# Patient Record
Sex: Female | Born: 1975
Health system: Southern US, Community
[De-identification: ages and names within clinical notes are randomized; demographics above are authoritative.]

## PROBLEM LIST (undated history)

## (undated) DIAGNOSIS — F419 Anxiety disorder, unspecified: Secondary | ICD-10-CM

## (undated) DIAGNOSIS — Z21 Asymptomatic human immunodeficiency virus [HIV] infection status: Secondary | ICD-10-CM

## (undated) DIAGNOSIS — F32A Depression, unspecified: Secondary | ICD-10-CM

## (undated) HISTORY — PX: TUBAL LIGATION: SHX77

## (undated) HISTORY — DX: Depression, unspecified: F32.A

## (undated) HISTORY — PX: WISDOM TOOTH EXTRACTION: SHX21

---

## 1997-07-03 ENCOUNTER — Other Ambulatory Visit: Admission: RE | Admit: 1997-07-03 | Discharge: 1997-07-03 | Payer: Self-pay | Admitting: Obstetrics

## 1997-07-04 ENCOUNTER — Other Ambulatory Visit: Admission: RE | Admit: 1997-07-04 | Discharge: 1997-07-04 | Payer: Self-pay | Admitting: Obstetrics

## 1997-07-06 ENCOUNTER — Ambulatory Visit (HOSPITAL_COMMUNITY): Admission: RE | Admit: 1997-07-06 | Discharge: 1997-07-06 | Payer: Self-pay | Admitting: Obstetrics

## 1997-08-21 ENCOUNTER — Inpatient Hospital Stay (HOSPITAL_COMMUNITY): Admission: AD | Admit: 1997-08-21 | Discharge: 1997-08-21 | Payer: Self-pay | Admitting: Obstetrics

## 1997-08-22 ENCOUNTER — Inpatient Hospital Stay (HOSPITAL_COMMUNITY): Admission: AD | Admit: 1997-08-22 | Discharge: 1997-08-22 | Payer: Self-pay | Admitting: Obstetrics

## 1997-09-15 ENCOUNTER — Emergency Department (HOSPITAL_COMMUNITY): Admission: EM | Admit: 1997-09-15 | Discharge: 1997-09-15 | Payer: Self-pay | Admitting: Emergency Medicine

## 1998-05-28 ENCOUNTER — Other Ambulatory Visit: Admission: RE | Admit: 1998-05-28 | Discharge: 1998-05-28 | Payer: Self-pay | Admitting: Obstetrics

## 1999-02-20 ENCOUNTER — Encounter: Payer: Self-pay | Admitting: Emergency Medicine

## 1999-02-20 ENCOUNTER — Emergency Department (HOSPITAL_COMMUNITY): Admission: EM | Admit: 1999-02-20 | Discharge: 1999-02-20 | Payer: Self-pay | Admitting: Emergency Medicine

## 2000-07-13 ENCOUNTER — Emergency Department (HOSPITAL_COMMUNITY): Admission: EM | Admit: 2000-07-13 | Discharge: 2000-07-13 | Payer: Self-pay | Admitting: Emergency Medicine

## 2000-08-19 ENCOUNTER — Other Ambulatory Visit: Admission: RE | Admit: 2000-08-19 | Discharge: 2000-08-19 | Payer: Self-pay | Admitting: *Deleted

## 2001-04-28 ENCOUNTER — Other Ambulatory Visit: Admission: RE | Admit: 2001-04-28 | Discharge: 2001-04-28 | Payer: Self-pay | Admitting: *Deleted

## 2001-09-17 ENCOUNTER — Other Ambulatory Visit: Admission: RE | Admit: 2001-09-17 | Discharge: 2001-09-17 | Payer: Self-pay | Admitting: *Deleted

## 2002-05-19 ENCOUNTER — Other Ambulatory Visit: Admission: RE | Admit: 2002-05-19 | Discharge: 2002-05-19 | Payer: Self-pay | Admitting: *Deleted

## 2002-05-23 ENCOUNTER — Encounter (INDEPENDENT_AMBULATORY_CARE_PROVIDER_SITE_OTHER): Payer: Self-pay | Admitting: Infectious Diseases

## 2002-05-23 ENCOUNTER — Encounter: Admission: RE | Admit: 2002-05-23 | Discharge: 2002-05-23 | Payer: Self-pay | Admitting: Infectious Diseases

## 2002-05-23 ENCOUNTER — Encounter (INDEPENDENT_AMBULATORY_CARE_PROVIDER_SITE_OTHER): Payer: Self-pay | Admitting: *Deleted

## 2002-05-23 LAB — CONVERTED CEMR LAB: CD4 Count: 10 microliters

## 2002-06-06 ENCOUNTER — Encounter: Admission: RE | Admit: 2002-06-06 | Discharge: 2002-06-06 | Payer: Self-pay | Admitting: Infectious Diseases

## 2002-06-09 ENCOUNTER — Encounter (INDEPENDENT_AMBULATORY_CARE_PROVIDER_SITE_OTHER): Payer: Self-pay | Admitting: Infectious Diseases

## 2002-06-09 ENCOUNTER — Encounter: Admission: RE | Admit: 2002-06-09 | Discharge: 2002-06-09 | Payer: Self-pay | Admitting: Infectious Diseases

## 2002-06-20 ENCOUNTER — Encounter: Admission: RE | Admit: 2002-06-20 | Discharge: 2002-06-20 | Payer: Self-pay | Admitting: Infectious Diseases

## 2002-08-10 ENCOUNTER — Encounter (INDEPENDENT_AMBULATORY_CARE_PROVIDER_SITE_OTHER): Payer: Self-pay | Admitting: Infectious Diseases

## 2002-08-10 ENCOUNTER — Ambulatory Visit (HOSPITAL_COMMUNITY): Admission: RE | Admit: 2002-08-10 | Discharge: 2002-08-10 | Payer: Self-pay | Admitting: Infectious Diseases

## 2002-08-10 ENCOUNTER — Encounter: Admission: RE | Admit: 2002-08-10 | Discharge: 2002-08-10 | Payer: Self-pay | Admitting: Infectious Diseases

## 2002-08-29 ENCOUNTER — Encounter: Admission: RE | Admit: 2002-08-29 | Discharge: 2002-08-29 | Payer: Self-pay | Admitting: Infectious Diseases

## 2002-11-02 ENCOUNTER — Encounter: Admission: RE | Admit: 2002-11-02 | Discharge: 2002-11-02 | Payer: Self-pay | Admitting: Infectious Diseases

## 2002-11-02 ENCOUNTER — Ambulatory Visit (HOSPITAL_COMMUNITY): Admission: RE | Admit: 2002-11-02 | Discharge: 2002-11-02 | Payer: Self-pay | Admitting: Infectious Diseases

## 2002-11-16 ENCOUNTER — Encounter: Admission: RE | Admit: 2002-11-16 | Discharge: 2002-11-16 | Payer: Self-pay | Admitting: Infectious Diseases

## 2002-12-07 ENCOUNTER — Encounter: Admission: RE | Admit: 2002-12-07 | Discharge: 2002-12-07 | Payer: Self-pay | Admitting: Infectious Diseases

## 2002-12-16 ENCOUNTER — Encounter: Admission: RE | Admit: 2002-12-16 | Discharge: 2002-12-16 | Payer: Self-pay | Admitting: Internal Medicine

## 2003-02-17 ENCOUNTER — Encounter: Admission: RE | Admit: 2003-02-17 | Discharge: 2003-02-17 | Payer: Self-pay | Admitting: Infectious Diseases

## 2003-03-06 ENCOUNTER — Encounter: Admission: RE | Admit: 2003-03-06 | Discharge: 2003-03-06 | Payer: Self-pay | Admitting: Infectious Diseases

## 2003-05-26 ENCOUNTER — Other Ambulatory Visit: Admission: RE | Admit: 2003-05-26 | Discharge: 2003-05-26 | Payer: Self-pay | Admitting: *Deleted

## 2003-06-26 ENCOUNTER — Encounter: Admission: RE | Admit: 2003-06-26 | Discharge: 2003-06-26 | Payer: Self-pay | Admitting: Infectious Diseases

## 2003-07-10 ENCOUNTER — Encounter: Admission: RE | Admit: 2003-07-10 | Discharge: 2003-07-10 | Payer: Self-pay | Admitting: Infectious Diseases

## 2003-10-30 ENCOUNTER — Ambulatory Visit: Payer: Self-pay | Admitting: Infectious Diseases

## 2003-10-30 ENCOUNTER — Ambulatory Visit (HOSPITAL_COMMUNITY): Admission: RE | Admit: 2003-10-30 | Discharge: 2003-10-30 | Payer: Self-pay | Admitting: Infectious Diseases

## 2003-11-13 ENCOUNTER — Ambulatory Visit: Payer: Self-pay | Admitting: Infectious Diseases

## 2003-11-20 ENCOUNTER — Other Ambulatory Visit: Admission: RE | Admit: 2003-11-20 | Discharge: 2003-11-20 | Payer: Self-pay | Admitting: Obstetrics & Gynecology

## 2004-01-24 ENCOUNTER — Ambulatory Visit: Payer: Self-pay | Admitting: Infectious Diseases

## 2004-03-19 ENCOUNTER — Ambulatory Visit: Payer: Self-pay | Admitting: Infectious Diseases

## 2004-03-19 ENCOUNTER — Ambulatory Visit (HOSPITAL_COMMUNITY): Admission: RE | Admit: 2004-03-19 | Discharge: 2004-03-19 | Payer: Self-pay | Admitting: Infectious Diseases

## 2004-04-10 ENCOUNTER — Ambulatory Visit: Payer: Self-pay | Admitting: Infectious Diseases

## 2004-05-06 ENCOUNTER — Ambulatory Visit: Payer: Self-pay | Admitting: Infectious Diseases

## 2004-07-18 ENCOUNTER — Ambulatory Visit: Payer: Self-pay | Admitting: Infectious Diseases

## 2004-07-18 ENCOUNTER — Ambulatory Visit (HOSPITAL_COMMUNITY): Admission: RE | Admit: 2004-07-18 | Discharge: 2004-07-18 | Payer: Self-pay | Admitting: Infectious Diseases

## 2004-08-08 ENCOUNTER — Ambulatory Visit: Payer: Self-pay | Admitting: Infectious Diseases

## 2004-12-03 ENCOUNTER — Ambulatory Visit: Payer: Self-pay | Admitting: Infectious Diseases

## 2004-12-03 ENCOUNTER — Ambulatory Visit (HOSPITAL_COMMUNITY): Admission: RE | Admit: 2004-12-03 | Discharge: 2004-12-03 | Payer: Self-pay | Admitting: Infectious Diseases

## 2004-12-23 ENCOUNTER — Ambulatory Visit: Payer: Self-pay | Admitting: Infectious Diseases

## 2005-04-09 ENCOUNTER — Encounter (INDEPENDENT_AMBULATORY_CARE_PROVIDER_SITE_OTHER): Payer: Self-pay | Admitting: *Deleted

## 2005-04-09 ENCOUNTER — Encounter: Admission: RE | Admit: 2005-04-09 | Discharge: 2005-04-09 | Payer: Self-pay | Admitting: Infectious Diseases

## 2005-04-09 ENCOUNTER — Ambulatory Visit: Payer: Self-pay | Admitting: Infectious Diseases

## 2005-04-09 LAB — CONVERTED CEMR LAB
CD4 Count: 580 microliters
HIV 1 RNA Quant: 49 copies/mL

## 2005-04-28 ENCOUNTER — Ambulatory Visit: Payer: Self-pay | Admitting: Infectious Diseases

## 2005-10-27 ENCOUNTER — Ambulatory Visit: Payer: Self-pay | Admitting: Infectious Diseases

## 2005-10-27 ENCOUNTER — Encounter: Admission: RE | Admit: 2005-10-27 | Discharge: 2005-10-27 | Payer: Self-pay | Admitting: Infectious Diseases

## 2005-10-27 ENCOUNTER — Encounter (INDEPENDENT_AMBULATORY_CARE_PROVIDER_SITE_OTHER): Payer: Self-pay | Admitting: *Deleted

## 2005-11-10 ENCOUNTER — Ambulatory Visit: Payer: Self-pay | Admitting: Infectious Diseases

## 2005-11-11 ENCOUNTER — Encounter: Payer: Self-pay | Admitting: Internal Medicine

## 2005-11-11 DIAGNOSIS — B20 Human immunodeficiency virus [HIV] disease: Secondary | ICD-10-CM

## 2005-11-11 DIAGNOSIS — F329 Major depressive disorder, single episode, unspecified: Secondary | ICD-10-CM

## 2005-11-11 DIAGNOSIS — R87613 High grade squamous intraepithelial lesion on cytologic smear of cervix (HGSIL): Secondary | ICD-10-CM | POA: Insufficient documentation

## 2006-03-02 ENCOUNTER — Encounter: Admission: RE | Admit: 2006-03-02 | Discharge: 2006-03-02 | Payer: Self-pay | Admitting: Infectious Diseases

## 2006-03-02 ENCOUNTER — Ambulatory Visit: Payer: Self-pay | Admitting: Infectious Diseases

## 2006-03-02 ENCOUNTER — Encounter (INDEPENDENT_AMBULATORY_CARE_PROVIDER_SITE_OTHER): Payer: Self-pay | Admitting: *Deleted

## 2006-03-02 LAB — CONVERTED CEMR LAB
Albumin: 4.5 g/dL (ref 3.5–5.2)
Alkaline Phosphatase: 71 units/L (ref 39–117)
Bilirubin Urine: NEGATIVE
CD4 Count: 590 microliters
CO2: 27 meq/L (ref 19–32)
Eosinophils Relative: 2 % (ref 0–5)
Glucose, Bld: 85 mg/dL (ref 70–99)
HIV 1 RNA Quant: 64 copies/mL
HIV 1 RNA Quant: 64 copies/mL — ABNORMAL HIGH (ref <50)
HIV-1 RNA Quant, Log: 1.81 — ABNORMAL HIGH (ref <1.70)
LDL Cholesterol: 76 mg/dL (ref 0–99)
Leukocytes, UA: NEGATIVE
Lymphocytes Relative: 45 % (ref 12–46)
Neutro Abs: 2.1 10*3/uL (ref 1.7–7.7)
Neutrophils Relative %: 43 % (ref 43–77)
Nitrite: NEGATIVE
Platelets: 247 10*3/uL (ref 150–400)
Potassium: 4.2 meq/L (ref 3.5–5.3)
RDW: 12.4 % (ref 11.5–14.0)
Sodium: 140 meq/L (ref 135–145)
Specific Gravity, Urine: 1.028 (ref 1.005–1.03)
Total Protein: 7.6 g/dL (ref 6.0–8.3)
Triglycerides: 77 mg/dL (ref <150)
Urobilinogen, UA: 0.2 (ref 0.0–1.0)

## 2006-03-30 ENCOUNTER — Encounter (INDEPENDENT_AMBULATORY_CARE_PROVIDER_SITE_OTHER): Payer: Self-pay | Admitting: Infectious Diseases

## 2006-04-06 ENCOUNTER — Encounter (INDEPENDENT_AMBULATORY_CARE_PROVIDER_SITE_OTHER): Payer: Self-pay | Admitting: *Deleted

## 2006-04-06 LAB — CONVERTED CEMR LAB

## 2006-04-14 ENCOUNTER — Ambulatory Visit: Payer: Self-pay | Admitting: Infectious Diseases

## 2006-04-19 ENCOUNTER — Encounter (INDEPENDENT_AMBULATORY_CARE_PROVIDER_SITE_OTHER): Payer: Self-pay | Admitting: *Deleted

## 2006-05-20 ENCOUNTER — Ambulatory Visit (HOSPITAL_COMMUNITY): Admission: RE | Admit: 2006-05-20 | Discharge: 2006-05-20 | Payer: Self-pay | Admitting: Obstetrics and Gynecology

## 2006-07-14 ENCOUNTER — Telehealth (INDEPENDENT_AMBULATORY_CARE_PROVIDER_SITE_OTHER): Payer: Self-pay | Admitting: Infectious Diseases

## 2006-07-28 ENCOUNTER — Encounter (INDEPENDENT_AMBULATORY_CARE_PROVIDER_SITE_OTHER): Payer: Self-pay | Admitting: *Deleted

## 2006-08-03 ENCOUNTER — Encounter: Admission: RE | Admit: 2006-08-03 | Discharge: 2006-08-03 | Payer: Self-pay | Admitting: Infectious Diseases

## 2006-08-03 ENCOUNTER — Ambulatory Visit: Payer: Self-pay | Admitting: Infectious Diseases

## 2006-08-03 ENCOUNTER — Encounter (INDEPENDENT_AMBULATORY_CARE_PROVIDER_SITE_OTHER): Payer: Self-pay | Admitting: *Deleted

## 2006-08-03 LAB — CONVERTED CEMR LAB
ALT: 27 units/L (ref 0–35)
AST: 22 units/L (ref 0–37)
BUN: 10 mg/dL (ref 6–23)
Basophils Absolute: 0 10*3/uL (ref 0.0–0.1)
Basophils Relative: 1 % (ref 0–1)
Creatinine, Ser: 0.8 mg/dL (ref 0.40–1.20)
Eosinophils Relative: 5 % (ref 0–5)
HCT: 40.3 % (ref 36.0–46.0)
HIV 1 RNA Quant: <50 copies/mL (ref <50)
Hemoglobin: 12.6 g/dL (ref 12.0–15.0)
MCHC: 31.3 g/dL (ref 30.0–36.0)
MCV: 90.8 fL (ref 78.0–100.0)
Monocytes Absolute: 0.2 10*3/uL (ref 0.2–0.7)
RDW: 12.5 % (ref 11.5–14.0)
Total Bilirubin: 0.4 mg/dL (ref 0.3–1.2)

## 2006-08-04 ENCOUNTER — Encounter: Payer: Self-pay | Admitting: Internal Medicine

## 2006-08-04 LAB — CONVERTED CEMR LAB: CD4 Count: 400 microliters

## 2006-08-31 ENCOUNTER — Encounter (INDEPENDENT_AMBULATORY_CARE_PROVIDER_SITE_OTHER): Payer: Self-pay | Admitting: *Deleted

## 2006-09-02 ENCOUNTER — Ambulatory Visit: Payer: Self-pay | Admitting: Internal Medicine

## 2006-09-02 DIAGNOSIS — N920 Excessive and frequent menstruation with regular cycle: Secondary | ICD-10-CM

## 2006-10-21 ENCOUNTER — Telehealth: Payer: Self-pay | Admitting: Internal Medicine

## 2006-11-12 ENCOUNTER — Encounter: Payer: Self-pay | Admitting: Internal Medicine

## 2006-11-12 LAB — CONVERTED CEMR LAB: Pap Smear: ABNORMAL

## 2006-11-16 ENCOUNTER — Ambulatory Visit: Payer: Self-pay | Admitting: Internal Medicine

## 2006-11-16 ENCOUNTER — Encounter: Admission: RE | Admit: 2006-11-16 | Discharge: 2006-11-16 | Payer: Self-pay | Admitting: Internal Medicine

## 2006-11-16 LAB — CONVERTED CEMR LAB
Albumin: 3.7 g/dL (ref 3.5–5.2)
Alkaline Phosphatase: 42 units/L (ref 39–117)
BUN: 8 mg/dL (ref 6–23)
CO2: 29 meq/L (ref 19–32)
Calcium: 9.2 mg/dL (ref 8.4–10.5)
Chloride: 104 meq/L (ref 96–112)
Eosinophils Absolute: 0.1 10*3/uL (ref 0.0–0.7)
Glucose, Bld: 83 mg/dL (ref 70–99)
HIV 1 RNA Quant: <50 copies/mL (ref <50)
HIV-1 RNA Quant, Log: <1.7 (ref <1.70)
Lymphocytes Relative: 27 % (ref 12–46)
Lymphs Abs: 1.7 10*3/uL (ref 0.7–3.3)
MCV: 87.8 fL (ref 78.0–100.0)
Monocytes Relative: 9 % (ref 3–11)
Neutrophils Relative %: 62 % (ref 43–77)
Potassium: 3.9 meq/L (ref 3.5–5.3)
RBC: 4.06 M/uL (ref 3.87–5.11)
Sodium: 137 meq/L (ref 135–145)
Total Protein: 6.6 g/dL (ref 6.0–8.3)
WBC: 6.4 10*3/uL (ref 4.0–10.5)

## 2006-11-17 ENCOUNTER — Ambulatory Visit: Payer: Self-pay | Admitting: Internal Medicine

## 2006-11-25 ENCOUNTER — Inpatient Hospital Stay (HOSPITAL_COMMUNITY): Admission: AD | Admit: 2006-11-25 | Discharge: 2006-11-25 | Payer: Self-pay | Admitting: Obstetrics

## 2006-11-28 ENCOUNTER — Inpatient Hospital Stay (HOSPITAL_COMMUNITY): Admission: AD | Admit: 2006-11-28 | Discharge: 2006-11-28 | Payer: Self-pay | Admitting: Obstetrics & Gynecology

## 2006-12-01 ENCOUNTER — Inpatient Hospital Stay (HOSPITAL_COMMUNITY): Admission: AD | Admit: 2006-12-01 | Discharge: 2006-12-01 | Payer: Self-pay | Admitting: Obstetrics & Gynecology

## 2006-12-04 ENCOUNTER — Inpatient Hospital Stay (HOSPITAL_COMMUNITY): Admission: AD | Admit: 2006-12-04 | Discharge: 2006-12-04 | Payer: Self-pay | Admitting: Obstetrics & Gynecology

## 2007-02-08 ENCOUNTER — Encounter (INDEPENDENT_AMBULATORY_CARE_PROVIDER_SITE_OTHER): Payer: Self-pay | Admitting: *Deleted

## 2007-02-09 ENCOUNTER — Encounter (INDEPENDENT_AMBULATORY_CARE_PROVIDER_SITE_OTHER): Payer: Self-pay | Admitting: *Deleted

## 2007-03-04 ENCOUNTER — Encounter: Payer: Self-pay | Admitting: Internal Medicine

## 2007-03-19 ENCOUNTER — Encounter: Admission: RE | Admit: 2007-03-19 | Discharge: 2007-03-19 | Payer: Self-pay | Admitting: Internal Medicine

## 2007-03-19 ENCOUNTER — Ambulatory Visit: Payer: Self-pay | Admitting: Internal Medicine

## 2007-03-19 LAB — CONVERTED CEMR LAB
ALT: 18 units/L (ref 0–35)
AST: 20 units/L (ref 0–37)
Basophils Absolute: 0 10*3/uL (ref 0.0–0.1)
Basophils Relative: 0 % (ref 0–1)
CO2: 22 meq/L (ref 19–32)
Calcium: 9.3 mg/dL (ref 8.4–10.5)
Chloride: 106 meq/L (ref 96–112)
Creatinine, Ser: 0.78 mg/dL (ref 0.40–1.20)
HIV 1 RNA Quant: 62 copies/mL — ABNORMAL HIGH (ref <50)
Lymphocytes Relative: 30 % (ref 12–46)
MCHC: 32.9 g/dL (ref 30.0–36.0)
Monocytes Absolute: 0.4 10*3/uL (ref 0.1–1.0)
Neutro Abs: 2.8 10*3/uL (ref 1.7–7.7)
Neutrophils Relative %: 61 % (ref 43–77)
Platelets: 221 10*3/uL (ref 150–400)
RDW: 12.9 % (ref 11.5–15.5)
Sodium: 139 meq/L (ref 135–145)
Total Protein: 7.6 g/dL (ref 6.0–8.3)

## 2007-04-02 ENCOUNTER — Ambulatory Visit: Payer: Self-pay | Admitting: Internal Medicine

## 2007-04-02 DIAGNOSIS — O009 Unspecified ectopic pregnancy without intrauterine pregnancy: Secondary | ICD-10-CM

## 2007-06-21 ENCOUNTER — Telehealth: Payer: Self-pay | Admitting: Internal Medicine

## 2007-06-23 ENCOUNTER — Ambulatory Visit: Payer: Self-pay | Admitting: Internal Medicine

## 2007-07-22 ENCOUNTER — Telehealth: Payer: Self-pay | Admitting: Internal Medicine

## 2007-07-28 ENCOUNTER — Encounter: Admission: RE | Admit: 2007-07-28 | Discharge: 2007-07-28 | Payer: Self-pay | Admitting: Internal Medicine

## 2007-07-28 ENCOUNTER — Ambulatory Visit: Payer: Self-pay | Admitting: Internal Medicine

## 2007-07-28 LAB — CONVERTED CEMR LAB
ALT: 19 units/L (ref 0–35)
AST: 17 units/L (ref 0–37)
Alkaline Phosphatase: 37 units/L — ABNORMAL LOW (ref 39–117)
BUN: 12 mg/dL (ref 6–23)
Basophils Absolute: 0 10*3/uL (ref 0.0–0.1)
Basophils Relative: 1 % (ref 0–1)
Creatinine, Ser: 0.75 mg/dL (ref 0.40–1.20)
Eosinophils Absolute: 0.2 10*3/uL (ref 0.0–0.7)
Hemoglobin: 11.6 g/dL — ABNORMAL LOW (ref 12.0–15.0)
MCHC: 32 g/dL (ref 30.0–36.0)
MCV: 89.9 fL (ref 78.0–100.0)
Monocytes Absolute: 0.3 10*3/uL (ref 0.1–1.0)
Monocytes Relative: 8 % (ref 3–12)
Neutrophils Relative %: 50 % (ref 43–77)
RBC: 4.04 M/uL (ref 3.87–5.11)
RDW: 14.2 % (ref 11.5–15.5)
Total Bilirubin: 0.3 mg/dL (ref 0.3–1.2)

## 2007-08-09 ENCOUNTER — Telehealth: Payer: Self-pay | Admitting: Internal Medicine

## 2007-08-11 ENCOUNTER — Ambulatory Visit: Payer: Self-pay | Admitting: Internal Medicine

## 2007-08-17 ENCOUNTER — Ambulatory Visit (HOSPITAL_COMMUNITY): Admission: RE | Admit: 2007-08-17 | Discharge: 2007-08-17 | Payer: Self-pay | Admitting: Obstetrics and Gynecology

## 2007-10-13 ENCOUNTER — Telehealth: Payer: Self-pay | Admitting: Internal Medicine

## 2007-11-09 ENCOUNTER — Ambulatory Visit: Payer: Self-pay | Admitting: Internal Medicine

## 2007-11-09 LAB — CONVERTED CEMR LAB
Albumin: 4.1 g/dL (ref 3.5–5.2)
Alkaline Phosphatase: 30 units/L — ABNORMAL LOW (ref 39–117)
BUN: 9 mg/dL (ref 6–23)
Creatinine, Ser: 0.63 mg/dL (ref 0.40–1.20)
Eosinophils Absolute: 0.2 10*3/uL (ref 0.0–0.7)
Eosinophils Relative: 2 % (ref 0–5)
Glucose, Bld: 71 mg/dL (ref 70–99)
HCT: 34.9 % — ABNORMAL LOW (ref 36.0–46.0)
HIV 1 RNA Quant: <50 copies/mL (ref <50)
HIV-1 RNA Quant, Log: <1.7 (ref <1.70)
Hemoglobin: 11.2 g/dL — ABNORMAL LOW (ref 12.0–15.0)
Lymphs Abs: 1.1 10*3/uL (ref 0.7–4.0)
MCV: 95.9 fL (ref 78.0–100.0)
Monocytes Absolute: 0.7 10*3/uL (ref 0.1–1.0)
Monocytes Relative: 9 % (ref 3–12)
Neutrophils Relative %: 73 % (ref 43–77)
Potassium: 3.8 meq/L (ref 3.5–5.3)
RBC: 3.64 M/uL — ABNORMAL LOW (ref 3.87–5.11)
WBC: 7.4 10*3/uL (ref 4.0–10.5)

## 2007-11-24 ENCOUNTER — Ambulatory Visit: Payer: Self-pay | Admitting: Internal Medicine

## 2007-12-20 ENCOUNTER — Inpatient Hospital Stay (HOSPITAL_COMMUNITY): Admission: AD | Admit: 2007-12-20 | Discharge: 2007-12-20 | Payer: Self-pay | Admitting: Obstetrics

## 2008-02-22 ENCOUNTER — Ambulatory Visit: Payer: Self-pay | Admitting: Internal Medicine

## 2008-02-22 LAB — CONVERTED CEMR LAB
ALT: 12 units/L (ref 0–35)
Albumin: 3.5 g/dL (ref 3.5–5.2)
Basophils Absolute: 0 10*3/uL (ref 0.0–0.1)
CO2: 21 meq/L (ref 19–32)
Calcium: 8.9 mg/dL (ref 8.4–10.5)
Chloride: 103 meq/L (ref 96–112)
HIV 1 RNA Quant: <48 copies/mL (ref <48)
Hemoglobin: 10.6 g/dL — ABNORMAL LOW (ref 12.0–15.0)
Lymphocytes Relative: 17 % (ref 12–46)
Monocytes Absolute: 0.6 10*3/uL (ref 0.1–1.0)
Neutro Abs: 5 10*3/uL (ref 1.7–7.7)
Platelets: 162 10*3/uL (ref 150–400)
RDW: 12.7 % (ref 11.5–15.5)
Sodium: 138 meq/L (ref 135–145)
Total Protein: 6.4 g/dL (ref 6.0–8.3)

## 2008-02-26 ENCOUNTER — Inpatient Hospital Stay (HOSPITAL_COMMUNITY): Admission: AD | Admit: 2008-02-26 | Discharge: 2008-02-26 | Payer: Self-pay | Admitting: Obstetrics and Gynecology

## 2008-02-27 ENCOUNTER — Inpatient Hospital Stay (HOSPITAL_COMMUNITY): Admission: AD | Admit: 2008-02-27 | Discharge: 2008-02-27 | Payer: Self-pay | Admitting: Obstetrics and Gynecology

## 2008-03-08 ENCOUNTER — Ambulatory Visit: Payer: Self-pay | Admitting: Internal Medicine

## 2008-03-14 ENCOUNTER — Encounter (INDEPENDENT_AMBULATORY_CARE_PROVIDER_SITE_OTHER): Payer: Self-pay | Admitting: *Deleted

## 2008-04-03 ENCOUNTER — Ambulatory Visit: Payer: Self-pay | Admitting: Internal Medicine

## 2008-04-09 ENCOUNTER — Inpatient Hospital Stay (HOSPITAL_COMMUNITY): Admission: AD | Admit: 2008-04-09 | Discharge: 2008-04-10 | Payer: Self-pay | Admitting: Obstetrics and Gynecology

## 2008-04-28 ENCOUNTER — Inpatient Hospital Stay (HOSPITAL_COMMUNITY): Admission: AD | Admit: 2008-04-28 | Discharge: 2008-05-01 | Payer: Self-pay | Admitting: Obstetrics

## 2008-04-28 ENCOUNTER — Encounter: Admission: RE | Admit: 2008-04-28 | Discharge: 2008-04-28 | Payer: Self-pay | Admitting: Obstetrics and Gynecology

## 2008-04-29 ENCOUNTER — Ambulatory Visit: Payer: Self-pay | Admitting: Infectious Diseases

## 2008-05-01 ENCOUNTER — Telehealth (INDEPENDENT_AMBULATORY_CARE_PROVIDER_SITE_OTHER): Payer: Self-pay | Admitting: Licensed Clinical Social Worker

## 2008-05-03 ENCOUNTER — Ambulatory Visit: Payer: Self-pay | Admitting: Internal Medicine

## 2008-07-25 ENCOUNTER — Ambulatory Visit: Payer: Self-pay | Admitting: Internal Medicine

## 2008-07-25 LAB — CONVERTED CEMR LAB
Basophils Absolute: 0 10*3/uL (ref 0.0–0.1)
CO2: 25 meq/L (ref 19–32)
Creatinine, Ser: 0.71 mg/dL (ref 0.40–1.20)
GFR calc Af Amer: >60 mL/min (ref >60)
GFR calc non Af Amer: >60 mL/min (ref >60)
Glucose, Bld: 86 mg/dL (ref 70–99)
HIV 1 RNA Quant: <48 copies/mL (ref <48)
HIV-1 RNA Quant, Log: <1.68 (ref <1.68)
Hemoglobin: 12.1 g/dL (ref 12.0–15.0)
Lymphocytes Relative: 45 % (ref 12–46)
Monocytes Absolute: 0.6 10*3/uL (ref 0.1–1.0)
Neutro Abs: 2.5 10*3/uL (ref 1.7–7.7)
RBC: 4.15 M/uL (ref 3.87–5.11)
RDW: 11.9 % (ref 11.5–15.5)
Total Bilirubin: 0.2 mg/dL — ABNORMAL LOW (ref 0.3–1.2)
Total Protein: 7.2 g/dL (ref 6.0–8.3)

## 2008-07-28 ENCOUNTER — Ambulatory Visit (HOSPITAL_COMMUNITY): Admission: RE | Admit: 2008-07-28 | Discharge: 2008-07-28 | Payer: Self-pay | Admitting: Obstetrics and Gynecology

## 2008-08-23 ENCOUNTER — Ambulatory Visit: Payer: Self-pay | Admitting: Internal Medicine

## 2008-11-29 ENCOUNTER — Encounter: Payer: Self-pay | Admitting: Internal Medicine

## 2008-11-30 ENCOUNTER — Telehealth: Payer: Self-pay | Admitting: Internal Medicine

## 2009-02-14 ENCOUNTER — Telehealth: Payer: Self-pay | Admitting: Internal Medicine

## 2009-02-15 ENCOUNTER — Encounter: Payer: Self-pay | Admitting: Internal Medicine

## 2009-02-16 ENCOUNTER — Ambulatory Visit: Payer: Self-pay | Admitting: Internal Medicine

## 2009-02-16 LAB — CONVERTED CEMR LAB
ALT: 16 units/L (ref 0–35)
AST: 14 units/L (ref 0–37)
Alkaline Phosphatase: 50 units/L (ref 39–117)
Basophils Absolute: 0 10*3/uL (ref 0.0–0.1)
Basophils Relative: 1 % (ref 0–1)
Calcium: 9.2 mg/dL (ref 8.4–10.5)
Chloride: 103 meq/L (ref 96–112)
Creatinine, Ser: 0.74 mg/dL (ref 0.40–1.20)
Eosinophils Absolute: 0.1 10*3/uL (ref 0.0–0.7)
Eosinophils Relative: 2 % (ref 0–5)
HCT: 39.9 % (ref 36.0–46.0)
HIV 1 RNA Quant: 49 copies/mL — ABNORMAL HIGH (ref <48)
LDL Cholesterol: 90 mg/dL (ref 0–99)
MCHC: 31.8 g/dL (ref 30.0–36.0)
MCV: 86.6 fL (ref >=78.0)
Monocytes Absolute: 0.3 10*3/uL (ref 0.1–1.0)
Platelets: 257 10*3/uL (ref 150–400)
RDW: 12.3 % (ref 11.5–15.5)
Total Bilirubin: 0.2 mg/dL — ABNORMAL LOW (ref 0.3–1.2)
VLDL: 9 mg/dL (ref 0–40)

## 2009-02-28 ENCOUNTER — Ambulatory Visit: Payer: Self-pay | Admitting: Internal Medicine

## 2009-03-19 ENCOUNTER — Encounter (INDEPENDENT_AMBULATORY_CARE_PROVIDER_SITE_OTHER): Payer: Self-pay | Admitting: *Deleted

## 2009-03-30 ENCOUNTER — Encounter (INDEPENDENT_AMBULATORY_CARE_PROVIDER_SITE_OTHER): Payer: Self-pay | Admitting: *Deleted

## 2009-06-19 ENCOUNTER — Encounter: Payer: Self-pay | Admitting: Internal Medicine

## 2009-11-21 ENCOUNTER — Ambulatory Visit: Payer: Self-pay | Admitting: Internal Medicine

## 2009-11-21 LAB — CONVERTED CEMR LAB
AST: 14 units/L (ref 0–37)
Albumin: 4.2 g/dL (ref 3.5–5.2)
Alkaline Phosphatase: 44 units/L (ref 39–117)
BUN: 9 mg/dL (ref 6–23)
Basophils Relative: 1 % (ref 0–1)
Calcium: 9 mg/dL (ref 8.4–10.5)
Chloride: 106 meq/L (ref 96–112)
Eosinophils Absolute: 0.2 10*3/uL (ref 0.0–0.7)
HIV-1 RNA Quant, Log: <1.3 (ref <1.30)
Lymphs Abs: 1.9 10*3/uL (ref 0.7–4.0)
MCHC: 32.7 g/dL (ref 30.0–36.0)
Monocytes Relative: 8 % (ref 3–12)
Neutro Abs: 4 10*3/uL (ref 1.7–7.7)
Neutrophils Relative %: 60 % (ref 43–77)
Platelets: 213 10*3/uL (ref 150–400)
Potassium: 4.5 meq/L (ref 3.5–5.3)
RBC: 4.24 M/uL (ref 3.87–5.11)
Sodium: 140 meq/L (ref 135–145)
Total Protein: 7 g/dL (ref 6.0–8.3)
WBC: 6.6 10*3/uL (ref 4.0–10.5)

## 2009-12-07 ENCOUNTER — Ambulatory Visit: Payer: Self-pay | Admitting: Internal Medicine

## 2009-12-07 DIAGNOSIS — R634 Abnormal weight loss: Secondary | ICD-10-CM | POA: Insufficient documentation

## 2009-12-10 LAB — CONVERTED CEMR LAB: Vit D, 25-Hydroxy: 28 ng/mL — ABNORMAL LOW (ref 30–89)

## 2009-12-13 ENCOUNTER — Encounter (INDEPENDENT_AMBULATORY_CARE_PROVIDER_SITE_OTHER): Payer: Self-pay | Admitting: *Deleted

## 2009-12-14 ENCOUNTER — Ambulatory Visit: Payer: Self-pay | Admitting: Internal Medicine

## 2009-12-14 LAB — CONVERTED CEMR LAB: Pap Smear: NEGATIVE

## 2009-12-21 ENCOUNTER — Encounter: Payer: Self-pay | Admitting: Internal Medicine

## 2009-12-25 ENCOUNTER — Encounter (INDEPENDENT_AMBULATORY_CARE_PROVIDER_SITE_OTHER): Payer: Self-pay | Admitting: *Deleted

## 2010-01-08 ENCOUNTER — Ambulatory Visit: Payer: Self-pay | Admitting: Internal Medicine

## 2010-03-14 NOTE — Miscellaneous (Signed)
Summary: Problem list update  Clinical Lists Changes  Problems: Added new problem of SCREENING FOR MALIGNANT NEOPLASM OF THE CERVIX (ICD-V76.2) 

## 2010-03-14 NOTE — Miscellaneous (Signed)
Summary: Infant delivered 04/29/2008, neg HIV screen  Clinical Lists Changes

## 2010-03-14 NOTE — Progress Notes (Signed)
Summary: Noncompliance  Phone Note From Pharmacy   Caller: [facility] Details for Reason: Non compliance Summary of Call: Received a fax from patient's pharmacy for noncompliance of refilling her mediction.  Patient was supposed to refill medication by 02/01/2009 in which according to their records she did not. This fax was sent for informational purposes to the prescribing physician.     Appended Document: Noncompliance Can we refer her to a bridge counselor or somehow make contact with her.  I have not seen her since July.  Appended Document: Noncompliance    Phone Note Call from Patient   Caller: Patient Summary of Call: Patient returned my call and she was sorry about not coming in sooner, but she had been busy with the baby and lost track of time. She is to come in tomorrow for labs, and 2 weeks to see Dr. Philipp Deputy. Initial call taken by: Starleen Arms CMA,  February 15, 2009 4:18 PM

## 2010-03-14 NOTE — Miscellaneous (Signed)
Summary: RW Update  Clinical Lists Changes  Observations: Added new observation of DELIV TYPE: Vaginal (03/19/2009 11:26) Added new observation of #CHILDDELHIV: 0  (03/19/2009 11:26) Added new observation of #CHILDDEL: 1  (03/19/2009 11:26)

## 2010-03-14 NOTE — Assessment & Plan Note (Signed)
Summary: F/U OV/VS   CC:  follow-up visit, lab results, c/o fatigue, and 11 lb. weight loss.  History of Present Illness: Pt here for f/u. She has lost weight which concerns her. She states that she is so busy with her 79month old that she forgets to eat.  No chronic cough,hemoptysis,change in stool, fever,abdominal pain or diarrhea. Has been getting 6-7 hours of sleep but always feels fatigued.  Preventive Screening-Counseling & Management  Alcohol-Tobacco     Alcohol drinks/day: occasional     Alcohol type: wine, mixed drinks     Smoking Status: current     Smoking Cessation Counseling: yes     Packs/Day: <0.25     Year Quit: 2 months     Passive Smoke Exposure: no  Caffeine-Diet-Exercise     Caffeine use/day: coffee 2 per day     Does Patient Exercise: no  Hep-HIV-STD-Contraception     HIV Risk: risk noted  Safety-Violence-Falls     Seat Belt Use: yes      Sexual History:  currently monogamous.        Drug Use:  never.    Comments: pt. declined condoms   Updated Prior Medication List: ATRIPLA 600-200-300 MG TABS (EFAVIRENZ-EMTRICITAB-TENOFOVIR) Take 1 tablet by mouth at bedtime  Current Allergies (reviewed today): ! SULFA Past History:  Past Medical History: Last updated: 11/11/2005 Depression HIV disease  Vital Signs:  Patient profile:   35 year old female Menstrual status:  regular Height:      66 inches (167.64 cm) Weight:      101.8 pounds (46.27 kg) BMI:     16.49 Temp:     98.6 degrees F (37.00 degrees C) oral Pulse rate:   92 / minute BP sitting:   127 / 83  (left arm)  Vitals Entered By: Wendall Mola CMA Duncan Dull) (December 07, 2009 2:32 PM) CC: follow-up visit, lab results, c/o fatigue, 11 lb. weight loss Is Patient Diabetic? No Pain Assessment Patient in pain? no      Nutritional Status BMI of < 19 = underweight Nutritional Status Detail appetite "up and down"  Have you ever been in a relationship where you felt threatened, hurt  or afraid?No   Does patient need assistance? Functional Status Self care Ambulation Normal Comments no missed doses of meds per pt.    Impression & Recommendations:  Problem # 1:  HIV DISEASE (ICD-042)  Diagnostics Reviewed:  CD4: 640 (11/22/2009)   WBC: 6.6 (11/21/2009)   Hgb: 11.7 (11/21/2009)   HCT: 35.8 (11/21/2009)   Platelets: 213 (11/21/2009) HIV-1 RNA: <20 copies/mL (11/21/2009)   HBSAg: NO (04/06/2006)  Medications Added to Medication List This Visit: 1)  Ensure Liqd (Nutritional supplements) .... 2 cans per day  Other Orders: Est. Patient Level III (16109) T-TSH (60454-09811) T-T4, Free [phone]) T- * Misc. Laboratory test [phone]) Influenza Vaccine NON MCR [phone]) Vit B12 1000 mcg (N6295) Future Orders: T-CD4SP ([facility]) (CD4SP) ... 06/05/2010 T-HIV Viral Load [phone]) ... 06/05/2010 T-Comprehensive Metabolic Panel [phone]) ... 06/05/2010 T-CBC w/Diff ([id number]) ... 06/05/2010  Patient Instructions: 1)  Please schedule a follow-up appointment in 6 months, 2 weeks after labs. 2)  Schedule for PAP  Prescriptions: ENSURE  LIQD (NUTRITIONAL SUPPLEMENTS) 2 cans per day  #2 cases x prn   Entered by:   Wendall Mola CMA ( AAMA)   Authorized by:   Yisroel Ramming MD   Signed by:   Wendall Mola CMA ( AAMA) on 12/07/2009   Method used:   Print  then Give to Patient   RxID:   [id number]       Immunizations Administered:  Influenza Vaccine # 1:    Vaccine Type: Fluvax Non-MCR    Site: right deltoid    Mfr: [employer]    Dose: 0.5 ml    Route: IM    Given by: Wendall Mola CMA ( AAMA)    Exp. Date: 05/12/2010    Lot #: [id number]    VIS given: 09/04/09 version given December 07, 2009.  Flu Vaccine Consent Questions:    Do you have a history of severe allergic reactions to this vaccine? no    Any prior history of allergic reactions to egg and/or gelatin? no    Do you have a sensitivity to the preservative Thimersol? no     Do you have a past history of Guillan-Barre Syndrome? no    Do you currently have an acute febrile illness? no    Have you ever had a severe reaction to latex? no    Vaccine information given and explained to patient? yes    Are you currently pregnant? no    Medication Administration  Injection # 1:    Medication: Vit B12 1000 mcg    Diagnosis: LOSS OF WEIGHT (ICD-783.21)    Route: IM    Site: L deltoid    Exp Date: 06/11/2011    Lot #: [id number]    Mfr: [employer]    Patient tolerated injection without complications    Given by: Wendall Mola CMA Duncan Dull) (December 07, 2009 4:29 PM)  Orders Added: 1)  T-CD4SP [facility]) [CD4SP] 2)  T-HIV Viral Load [phone] 3)  T-Comprehensive Metabolic Panel [80053-22900] 4)  T-CBC w/Diff [[id number]] 5)  Est. Patient Level III [40102] 6)  T-TSH [72536-64403] 7)  T-T4, Free [47425-95638] 8)  T- * Misc. Laboratory test [99999] 9)  Influenza Vaccine NON MCR [00028] 10)  Vit B12 1000 mcg [J3420]

## 2010-03-14 NOTE — Assessment & Plan Note (Signed)
Summary: b12 shot  Prior Medications: ATRIPLA 600-200-300 MG TABS (EFAVIRENZ-EMTRICITAB-TENOFOVIR) Take 1 tablet by mouth at bedtime ENSURE  LIQD (NUTRITIONAL SUPPLEMENTS) 2 cans per day Current Allergies: ! SULFA Medication Administration  Injection # 1:    Medication: Vit B12 1000 mcg    Diagnosis: HIV DISEASE (ICD-042)    Route: IM    Site: L deltoid    Exp Date: 09/11/2011    Lot #: [id number]    Mfr: [employer]    Patient tolerated injection without complications    Given by: Starleen Arms CMA (January 08, 2010 10:00 AM)  Orders Added: 1)  Vit B12 1000 mcg [J3420]    Medication Administration  Injection # 1:    Medication: Vit B12 1000 mcg    Diagnosis: HIV DISEASE (ICD-042)    Route: IM    Site: L deltoid    Exp Date: 09/11/2011    Lot #: [id number]    Mfr: [employer]    Patient tolerated injection without complications    Given by: Starleen Arms CMA (January 08, 2010 10:00 AM)  Orders Added: 1)  Vit B12 1000 mcg [J3420]

## 2010-03-14 NOTE — Medication Information (Signed)
Summary: [facility]   Imported By: Florinda Marker 07/17/2009 16:49:25  _____________________________________________________________________  External Attachment:    Type:   Image     Comment:   External Document

## 2010-03-14 NOTE — Letter (Signed)
Summary: Results Follow-up Letter  [facility]  [address]   [place], [place] [address]   Phone: [phone]  Fax: [phone]         December 21, 2009  [address] [place], [place]  [address]  Dear Ms. Paget,   The following are the results of your recent test(s):  Test     Result     Pap Smear    Normal_XXX___  Not Normal_____       Comments:  Everything was normal.  I will see you in one year for your next PAP smear.  Thank you for coming to the Center for your care.  Happy Holidays!  Sincerely,    [name] [facility]

## 2010-03-14 NOTE — Miscellaneous (Signed)
  Clinical Lists Changes  Observations: Added new observation of YEARAIDSPOS: 2004  (12/25/2009 14:46) Added new observation of HIV STATUS: CDC-defined AIDS  (12/25/2009 14:46)

## 2010-03-14 NOTE — Medication Information (Signed)
Summary: [facility]   Imported By: Florinda Marker 02/15/2009 14:52:31  _____________________________________________________________________  External Attachment:    Type:   Image     Comment:   External Document

## 2010-03-14 NOTE — Assessment & Plan Note (Signed)
Summary: PAP SMEAR appt   Vital Signs:  Patient profile:   35 year old female Menstrual status:  regular LMP:     12/02/2009  Vitals Entered By: [name] RN (December 14, 2009 2:29 PM) CC: PAP smear visit.  Previously had abnl PAP, had laproscopy and LEEP in the past.  Daughter born 19 months ago.  Fast Delivery with cervical tearing.  Pt. given educational materials re:  HIV and women, diet, exercise, nutrition, BSE and self-esteem.  Pt given condoms., Depression LMP (date): 12/02/2009 LMP - Character: normal     Menstrual Status regular Enter LMP: 12/02/2009 Last PAP Result abnormal   Preventive Screening-Counseling & Management      Drug Use:  never.    Behavioral Health Assessment Do you use recreational drugs? never  Evaluation and Follow-Up  Prevention For Positives: 12/14/2009   Safe sex practices discussed with patient. Condoms offered. Prior Medications: ATRIPLA 600-200-300 MG TABS (EFAVIRENZ-EMTRICITAB-TENOFOVIR) Take 1 tablet by mouth at bedtime ENSURE  LIQD (NUTRITIONAL SUPPLEMENTS) 2 cans per day Current Allergies: ! SULFA Orders Added: 1)  Est. Patient Level I [95638] 2)  T-PAP [facility]) [75643]  Patient Instructions: 1)  Please schedule a follow-up appointment in 1 year. 2)  Your results will be ready in about a week.  I will mail them to you. 3)  Thank you for coming to the Center for your Care.           Medication Adherence: 12/14/2009   Adherence to medications reviewed with patient. Counseling to provide adequate adherence provided    Prevention For Positives: 12/14/2009   Safe sex practices discussed with patient. Condoms offered.        12/14/2009   Patient was screened for substance abuse and depression. Referal was made as indicated.                       Depression History:      The patient denies a depressed mood most of the day and a diminished interest in her usual daily activities.

## 2010-03-14 NOTE — Assessment & Plan Note (Signed)
Summary: 2WK F/U   History of Present Illness: Pt has been feeling very tired and overwhelmed with her baby. She just wants to sleep and eat. She does not really look forward to the day when she gets up in the morning. She wonders if she can have post-partum depression.  Not suicidal or homicidal.  The baby is doing well and is sleeping through the night. She is a stay at home Mom.   She is taking her Atripla regularly.   Flu Vaccine Consent Questions     Do you have a history of severe allergic reactions to this vaccine? no    Any prior history of allergic reactions to egg and/or gelatin? no    Do you have a sensitivity to the preservative Thimersol? no    Do you have a past history of Guillan-Barre Syndrome? no    Do you currently have an acute febrile illness? no    Have you ever had a severe reaction to latex? no    Vaccine information given and explained to patient? yes    Are you currently pregnant? no    Lot [id number]   Exp Date:05/10/2009   Manufacturer: [employer]    Site Given  Left Deltoid IM Tomasita Morrow RN  February 28, 2009 10:02 AM   Preventive Screening-Counseling & Management  Alcohol-Tobacco     Alcohol drinks/day: 0     Alcohol type: wine, mixed drinks     Smoking Status: never     Smoking Cessation Counseling: yes     Packs/Day: occass     Year Quit: 2 months     Passive Smoke Exposure: no  Hep-HIV-STD-Contraception     HIV Risk: no risk noted      Sexual History:  currently monogamous.        Drug Use:  never.     Updated Prior Medication List: ATRIPLA 600-200-300 MG TABS (EFAVIRENZ-EMTRICITAB-TENOFOVIR) Take 1 tablet by mouth at bedtime WELLBUTRIN XL 150 MG XR24H-TAB (BUPROPION HCL) Take 1 tablet by mouth once a day  Current Allergies: ! SULFA Past History:  Past Medical History: Last updated: 11/11/2005 Depression HIV disease  Review of Systems  The patient denies anorexia, fever, and weight loss.    Vital Signs:  Patient profile:    35 year old female Menstrual status:  regular LMP:     02/06/2009 Height:      66 inches (167.64 cm) Weight:      112 pounds (50.91 kg) BMI:     18.14 Pulse rate:   105 / minute BP sitting:   122 / 77  (left arm)  Vitals Entered By: Starleen Arms CMA (February 28, 2009 9:08 AM) Is Patient Diabetic? No Pain Assessment Patient in pain? no      Nutritional Status BMI of < 19 = underweight Nutritional Status Detail NL  Does patient need assistance? Functional Status Self care Ambulation Normal LMP (date): 02/06/2009     Enter LMP: 02/06/2009 Last PAP Result abnormal   Physical Exam  General:  alert, well-developed, well-nourished, and well-hydrated.   Head:  normocephalic and atraumatic.   Lungs:  normal breath sounds.      Impression & Recommendations:  Problem # 1:  HIV DISEASE (ICD-042) Pt.s most recent CD4ct was 590 and VL 49 .  Pt instructed to continue the current antiretroviral regimen.  Pt encouraged to take medication regularly and not miss doses.  Pt will f/u in 3 months for repeat blood work and will see me 2 weeks  later. Influenza vaccine given.  Diagnostics Reviewed:  CD4: 590 (02/19/2009)   WBC: 4.8 (02/16/2009)   Hgb: 12.7 (02/16/2009)   HCT: 39.9 (02/16/2009)   Platelets: 257 (02/16/2009) HIV-1 RNA: 49 (02/16/2009)   HBSAg: NO (04/06/2006)  Problem # 2:  DEPRESSION (ICD-311) could be post-partum depression vs. depression as a side effect of her Atripla.  Pt does not want to stope her Atripla.  She was on Wellbutrin in the past and is willing to try it again.  She will discuss her depression with her OB/Gyne. Her updated medication list for this problem includes:    Wellbutrin Xl 150 Mg Xr24h-tab (Bupropion hcl) .Marland Kitchen... Take 1 tablet by mouth once a day  Medications Added to Medication List This Visit: 1)  Wellbutrin Xl 150 Mg Xr24h-tab (Bupropion hcl) .... Take 1 tablet by mouth once a day  Other Orders: Est. Patient Level III (16109) Admin 1st  Vaccine (60454) Flu Vaccine 69yrs + (09811) Future Orders: T-CD4SP ([facility]) (CD4SP) ... 08/27/2009 T-HIV Viral Load [phone]) ... 08/27/2009 T-Comprehensive Metabolic Panel [phone]) ... 08/27/2009 T-CBC w/Diff ([id number]) ... 08/27/2009 T-RPR (Syphilis) [phone]) ... 08/27/2009  Patient Instructions: 1)  Please schedule a follow-up appointment in 6 months, 2 weeks after labs.  Prescriptions: WELLBUTRIN XL 150 MG XR24H-TAB (BUPROPION HCL) Take 1 tablet by mouth once a day  #30 x 5   Entered and Authorized by:   Yisroel Ramming MD   Signed by:   Yisroel Ramming MD on 02/28/2009   Method used:   Print then Give to Patient   RxID:   [id number]  Process Orders Check Orders Results:     [facility]: ABN not required for this insurance Tests Sent for requisitioning (March 01, 2009 9:22 AM):     08/27/2009: [facility] -- T-HIV Viral Load [phone] (signed)     08/27/2009: [facility] -- T-Comprehensive Metabolic Panel [[id number]] (signed)     08/27/2009: [facility] -- T-CBC w/Diff [[id number]] (signed)     08/27/2009: [facility] -- T-RPR (Syphilis) [phone] (signed)    Immunization History:  Influenza Immunization History:    Influenza:  historical (03/14/2008)

## 2010-04-25 LAB — T-HELPER CELL (CD4) - (RCID CLINIC ONLY)
CD4 % Helper T Cell: 36 % (ref 33–55)
CD4 T Cell Abs: 640 uL (ref 400–2700)

## 2010-04-28 LAB — T-HELPER CELL (CD4) - (RCID CLINIC ONLY)
CD4 % Helper T Cell: 32 % — ABNORMAL LOW (ref 33–55)
CD4 T Cell Abs: 590 uL (ref 400–2700)

## 2010-05-20 LAB — T-HELPER CELL (CD4) - (RCID CLINIC ONLY)
CD4 % Helper T Cell: 33 % (ref 33–55)
CD4 T Cell Abs: 870 uL (ref 400–2700)

## 2010-05-20 LAB — CBC
MCHC: 33.5 g/dL (ref 30.0–36.0)
MCV: 94 fL (ref 78.0–100.0)
Platelets: 203 10*3/uL (ref 150–400)
RDW: 12 % (ref 11.5–15.5)

## 2010-05-22 ENCOUNTER — Other Ambulatory Visit: Payer: Self-pay | Admitting: *Deleted

## 2010-05-22 DIAGNOSIS — B20 Human immunodeficiency virus [HIV] disease: Secondary | ICD-10-CM

## 2010-05-22 MED ORDER — EFAVIRENZ-EMTRICITAB-TENOFOVIR 600-200-300 MG PO TABS: 1.0000 | ORAL_TABLET | Freq: Every day | ORAL | Status: DC

## 2010-05-23 LAB — COMPREHENSIVE METABOLIC PANEL
ALT: 25 U/L (ref 0–35)
AST: 36 U/L (ref 0–37)
Alkaline Phosphatase: 96 U/L (ref 39–117)
CO2: 22 mEq/L (ref 19–32)
Chloride: 103 mEq/L (ref 96–112)
GFR calc Af Amer: >60 mL/min (ref >60)
GFR calc non Af Amer: >60 mL/min (ref >60)
Glucose, Bld: 84 mg/dL (ref 70–99)
Potassium: 3.8 mEq/L (ref 3.5–5.1)
Sodium: 134 mEq/L — ABNORMAL LOW (ref 135–145)

## 2010-05-23 LAB — CBC
HCT: 32.2 % — ABNORMAL LOW (ref 36.0–46.0)
HCT: 35.6 % — ABNORMAL LOW (ref 36.0–46.0)
Hemoglobin: 11.8 g/dL — ABNORMAL LOW (ref 12.0–15.0)
MCV: 103.4 fL — ABNORMAL HIGH (ref 78.0–100.0)
Platelets: 116 10*3/uL — ABNORMAL LOW (ref 150–400)
RBC: 3.11 MIL/uL — ABNORMAL LOW (ref 3.87–5.11)
RBC: 3.48 MIL/uL — ABNORMAL LOW (ref 3.87–5.11)
WBC: 11.6 10*3/uL — ABNORMAL HIGH (ref 4.0–10.5)
WBC: 9.7 10*3/uL (ref 4.0–10.5)

## 2010-05-23 LAB — LACTATE DEHYDROGENASE: LDH: 145 U/L (ref 94–250)

## 2010-05-27 LAB — T-HELPER CELL (CD4) - (RCID CLINIC ONLY): CD4 T Cell Abs: 420 uL (ref 400–2700)

## 2010-06-04 ENCOUNTER — Other Ambulatory Visit: Payer: Self-pay

## 2010-06-18 ENCOUNTER — Ambulatory Visit: Payer: Self-pay | Admitting: Infectious Diseases

## 2010-06-25 ENCOUNTER — Other Ambulatory Visit (INDEPENDENT_AMBULATORY_CARE_PROVIDER_SITE_OTHER): Payer: 59

## 2010-06-25 DIAGNOSIS — B20 Human immunodeficiency virus [HIV] disease: Secondary | ICD-10-CM

## 2010-06-25 NOTE — Op Note (Signed)
[name], [name] NO.:  [id number]   MEDICAL RECORD NO.:  [id number]          PATIENT TYPE:  AMB   LOCATION:  SDC                           FACILITY:  [facility]   PHYSICIAN:  Maxie Better, M.D.DATE OF BIRTH:  1976/01/14   DATE OF PROCEDURE:  07/28/2008  DATE OF DISCHARGE:  07/28/2008                               OPERATIVE REPORT   PREOPERATIVE DIAGNOSIS:  Desires sterilization.   PROCEDURE:  Laparoscopic tubal ligation with bipolar cautery.   POSTOPERATIVE DIAGNOSIS:  Desires sterilization.   ANESTHESIA:  General.   SURGEON:  Maxie Better, MD   ASSISTANT:  None.   DESCRIPTION OF PROCEDURE:  Under adequate general anesthesia, the  patient was placed in the dorsal lithotomy position.  She was sterilely  prepped and draped in usual fashion.  The bladder was catheterized for  scant amount of urine.  The patient had just voided.  Bivalve speculum  was placed in vagina.  Single-toothed tenaculum was placed on the  anterior lip of the cervix.  An acorn cannula was introduced into the  cervical os and attached to the tenaculum for manipulation of the  uterus.  The bivalve speculum was removed.  The patient has an  anteverted uterus.  No adnexal masses with the exam under anesthesia.  A  0.25% Marcaine was injected infraumbilically.  Infraumbilical incision  was then made.  Veress needle was introduced and tested with normal  saline with good placement.  Carbon dioxide was insufflated.  Opening  pressure of 3 was noted.  A 2.2 liters of CO2 was insufflated.  The  Veress needle was then removed.  A 10-mm disposable trocar with sleeve  was introduced into the abdominal cavity without incident.  A lighted  video laparoscope was placed through that port.  Panoramic inspection  showed no evidence of trauma with respect to entry into the abdomen.  Normal liver edge was noted.  Attention was then turned to the pelvis.  A suprapubic incision was then made and a  5-mm port was placed under  direct visualization.  A probe was then used to inspect the pelvis.  Normal elongated bilateral fallopian tubes were noted.  Both ovaries  were normal.  The left ovary had evidence of corpus luteal cyst.  No  endometriosis noted anteriorly and posteriorly.  Through that site, a  bipolar cautery was placed.  The midportion of both fallopian tubes were  cauterized for about a centimeter and a half.  When that was felt to be  inadequate, the suprapubic site was removed.  The abdomen was deflated.  The infraumbilical site was removed under visualization.  The patient  was relatively thin such that you could reach the fascia and peritoneum  infraumbilically with that, and this was carefully performed and lifted  up away from the bowel and a figure-of-eight 0-Vicryl suture was placed  and both skin sites were then approximated with Dermabond.  The  instruments in the vagina were removed.   SPECIMENS:  None.   ESTIMATED BLOOD LOSS:  Minimal.   COMPLICATIONS:  None.   The patient tolerated the procedure well,  was transferred to recovery  room in stable condition.      Maxie Better, M.D.  Electronically Signed     [place]/MEDQ  D:  07/28/2008  T:  07/29/2008  Job:  [id number]

## 2010-06-26 LAB — HIV-1 RNA QUANT-NO REFLEX-BLD
HIV 1 RNA Quant: <20 copies/mL (ref <20)
HIV-1 RNA Quant, Log: <1.3 {Log} (ref <1.30)

## 2010-06-26 LAB — CBC WITH DIFFERENTIAL/PLATELET
Eosinophils Absolute: 0.3 10*3/uL (ref 0.0–0.7)
Eosinophils Relative: 5 % (ref 0–5)
HCT: 39.5 % (ref 36.0–46.0)
Hemoglobin: 12.4 g/dL (ref 12.0–15.0)
Lymphs Abs: 1.8 10*3/uL (ref 0.7–4.0)
MCH: 27.7 pg (ref 26.0–34.0)
MCHC: 31.4 g/dL (ref 30.0–36.0)
MCV: 88.4 fL (ref 78.0–100.0)
Monocytes Absolute: 0.4 10*3/uL (ref 0.1–1.0)
Monocytes Relative: 8 % (ref 3–12)
RBC: 4.47 MIL/uL (ref 3.87–5.11)

## 2010-06-26 LAB — COMPLETE METABOLIC PANEL WITH GFR
ALT: 10 U/L (ref 0–35)
AST: 15 U/L (ref 0–37)
Creat: 0.76 mg/dL (ref 0.40–1.20)
Sodium: 139 mEq/L (ref 135–145)
Total Bilirubin: 0.4 mg/dL (ref 0.3–1.2)
Total Protein: 6.7 g/dL (ref 6.0–8.3)

## 2010-06-26 LAB — T-HELPER CELL (CD4) - (RCID CLINIC ONLY): CD4 T Cell Abs: 600 uL (ref 400–2700)

## 2010-06-28 NOTE — Op Note (Signed)
Jacqueline Dawson, Jacqueline Dawson               ACCOUNT NO.:  [id number]   MEDICAL RECORD NO.:  [id number]          PATIENT TYPE:  AMB   LOCATION:  SDC                           FACILITY:  [facility]   PHYSICIAN:  Maxie Better, M.D.DATE OF BIRTH:  05/19/1975   DATE OF PROCEDURE:  05/20/2006  DATE OF DISCHARGE:                               OPERATIVE REPORT   PREOPERATIVE DIAGNOSIS:  Moderate vaginal dysplasia.   PROCEDURE:  Laser vaporization of vaginal dysplasia.   POSTOPERATIVE DIAGNOSIS:  Moderate vaginal dysplasia.   ANESTHESIA:  General.   SURGEON:  Maxie Better, M.D.   ASSISTANT:  Cordelia Pen A. Rosalio Macadamia, M.D.   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  The risks and benefits of the procedure have been  explained to the patient, consent was signed, and the patient was  transferred to the operating room.   DESCRIPTION OF PROCEDURE:  Under adequate general anesthesia, the  patient was placed in the dorsal lithotomy position.  Wet towels were  placed circumferentially on the outside.  A coated bivalve speculum was  placed in the vagina.  4% acetic acid was then utilized to colposcope  the vagina.  The cervix, again, showed no evidence of any lesions. At  about the 3-4 o'clock position, the left lateral fornix was raised  acetowhite lesions as well as the right lateral vaginal wall. Using the  laser at a low power of 10, the lesions were outlined circumferentially  and then a power of 20 was then utilized to vaporize the dysplastic  area.  The rest of the vagina did not show any other lesions. At that  point, all instruments were then removed from the vagina and good  hemostasis was noted.  Specimen was none.  Estimated blood loss was  minimal.  Complication was none.  The patient tolerated the procedure  well and was transferred to the recovery room in stable  condition.      Maxie Better, M.D.  Electronically Signed     [place]/MEDQ  D:  05/20/2006  T:  05/20/2006  Job:  [id number]   cc:   Rockey Situ. Flavia Shipper., M.D.  Fax: [phone]

## 2010-07-09 ENCOUNTER — Ambulatory Visit: Payer: Self-pay | Admitting: Infectious Diseases

## 2010-08-26 ENCOUNTER — Ambulatory Visit: Payer: Self-pay | Admitting: Infectious Diseases

## 2010-10-09 ENCOUNTER — Encounter: Payer: Self-pay | Admitting: Infectious Diseases

## 2010-10-09 ENCOUNTER — Ambulatory Visit (INDEPENDENT_AMBULATORY_CARE_PROVIDER_SITE_OTHER): Payer: 59 | Admitting: Infectious Diseases

## 2010-10-09 VITALS — BP 111/74 | HR 92 | Temp 98.5°F | Ht 66.0 in | Wt 105.0 lb

## 2010-10-09 DIAGNOSIS — R87613 High grade squamous intraepithelial lesion on cytologic smear of cervix (HGSIL): Secondary | ICD-10-CM

## 2010-10-09 DIAGNOSIS — Z113 Encounter for screening for infections with a predominantly sexual mode of transmission: Secondary | ICD-10-CM

## 2010-10-09 DIAGNOSIS — Z23 Encounter for immunization: Secondary | ICD-10-CM

## 2010-10-09 DIAGNOSIS — F329 Major depressive disorder, single episode, unspecified: Secondary | ICD-10-CM

## 2010-10-09 DIAGNOSIS — Z79899 Other long term (current) drug therapy: Secondary | ICD-10-CM

## 2010-10-09 DIAGNOSIS — F3289 Other specified depressive episodes: Secondary | ICD-10-CM

## 2010-10-09 DIAGNOSIS — G4700 Insomnia, unspecified: Secondary | ICD-10-CM

## 2010-10-09 DIAGNOSIS — Z720 Tobacco use: Secondary | ICD-10-CM | POA: Insufficient documentation

## 2010-10-09 DIAGNOSIS — B20 Human immunodeficiency virus [HIV] disease: Secondary | ICD-10-CM

## 2010-10-09 DIAGNOSIS — F17200 Nicotine dependence, unspecified, uncomplicated: Secondary | ICD-10-CM

## 2010-10-09 MED ORDER — EMTRICITAB-RILPIVIR-TENOFOV DF 200-25-300 MG PO TABS: 1.0000 | ORAL_TABLET | Freq: Every day | ORAL | Status: DC

## 2010-10-09 NOTE — Assessment & Plan Note (Signed)
Encouraged to quit, she only smokes 2 cigarrettes/day. She should be able to do this without pharmacotherapy.

## 2010-10-09 NOTE — Assessment & Plan Note (Signed)
i offered to write her rx but she does not want this with her small child at home. She does occas take melatonin with good relief. Will cont this and see if her sleep improves with changing atripla to complera

## 2010-10-09 NOTE — Assessment & Plan Note (Signed)
sched for PAP with Angelique Blonder

## 2010-10-09 NOTE — Progress Notes (Signed)
  Subjective:    Patient ID: Jacqueline Dawson, female    DOB: 21-Jun-1975, 35 y.o.   MRN: [id number]  HPI 35 yo F with hx of HIV+ and previous LEEP ~ years ago. She had normal PAP 12-14-09. CD4 600 and VL <20. Has a "normal 35 year old at home" and a 35 yo son. Taking atripla but has some difficulty with abn dreams, daytime nausea.    Review of Systems  Constitutional: Negative for fever, chills and unexpected weight change.  Gastrointestinal: Negative for constipation.  Genitourinary: Negative for dysuria and menstrual problem.       Objective:   Physical Exam  Constitutional: She appears well-nourished.  Eyes: EOM are normal. Pupils are equal, round, and reactive to light.  Neck: Neck supple.  Cardiovascular: Normal rate, regular rhythm and normal heart sounds.   Pulmonary/Chest: Effort normal and breath sounds normal.  Abdominal: Soft. Bowel sounds are normal. There is no tenderness.  Lymphadenopathy:    She has no cervical adenopathy.          Assessment & Plan:

## 2010-10-09 NOTE — Assessment & Plan Note (Signed)
She is doing wel but having ADRs. Will change her to complera. She is offered condoms. Gets flu/ppd today.

## 2010-10-09 NOTE — Assessment & Plan Note (Signed)
She currently does not complain of depression.

## 2010-10-11 ENCOUNTER — Other Ambulatory Visit (HOSPITAL_COMMUNITY)
Admission: RE | Admit: 2010-10-11 | Discharge: 2010-10-11 | Disposition: A | Discharge status: Home / Self Care | Payer: 59 | Source: Ambulatory Visit | Attending: Infectious Diseases | Admitting: Infectious Diseases

## 2010-10-11 ENCOUNTER — Ambulatory Visit (INDEPENDENT_AMBULATORY_CARE_PROVIDER_SITE_OTHER): Payer: 59 | Admitting: Infectious Diseases

## 2010-10-11 ENCOUNTER — Other Ambulatory Visit: Payer: Self-pay | Admitting: *Deleted

## 2010-10-11 ENCOUNTER — Ambulatory Visit: Payer: 59

## 2010-10-11 ENCOUNTER — Telehealth: Payer: Self-pay | Admitting: *Deleted

## 2010-10-11 DIAGNOSIS — B20 Human immunodeficiency virus [HIV] disease: Secondary | ICD-10-CM

## 2010-10-11 DIAGNOSIS — Z01419 Encounter for gynecological examination (general) (routine) without abnormal findings: Secondary | ICD-10-CM | POA: Insufficient documentation

## 2010-10-11 DIAGNOSIS — Z124 Encounter for screening for malignant neoplasm of cervix: Secondary | ICD-10-CM

## 2010-10-11 MED ORDER — EMTRICITAB-RILPIVIR-TENOFOV DF 200-25-300 MG PO TABS: 1.0000 | ORAL_TABLET | Freq: Every day | ORAL | Status: DC

## 2010-10-11 NOTE — Progress Notes (Signed)
Pt given educational materials re:  HIV and women, nutrition, exercise, BSE, self-esteem, Complera and 500-calorie meals.  Pt given condoms.  [name], RN.

## 2010-10-11 NOTE — Patient Instructions (Signed)
  Your results will be ready in about a week.  I will mail them to you.  Thank you for coming to the Center for your care.  Angelique Blonder

## 2010-10-11 NOTE — Telephone Encounter (Signed)
TB test is negative. Gave her copy if form from outpt clinic

## 2010-10-17 ENCOUNTER — Encounter: Payer: Self-pay | Admitting: *Deleted

## 2010-11-01 ENCOUNTER — Ambulatory Visit: Payer: 59

## 2010-11-01 LAB — T-HELPER CELL (CD4) - (RCID CLINIC ONLY): CD4 % Helper T Cell: 32 — ABNORMAL LOW

## 2010-11-07 LAB — T-HELPER CELL (CD4) - (RCID CLINIC ONLY)
CD4 % Helper T Cell: 38
CD4 T Cell Abs: 480

## 2010-11-11 LAB — T-HELPER CELL (CD4) - (RCID CLINIC ONLY)
CD4 % Helper T Cell: 39
CD4 T Cell Abs: 370 — ABNORMAL LOW

## 2010-11-20 LAB — DIFFERENTIAL
Basophils Relative: 1
Eosinophils Absolute: 0.1
Eosinophils Relative: 2
Lymphocytes Relative: 30
Lymphs Abs: 1.3
Monocytes Absolute: 0.5
Monocytes Relative: 7
Monocytes Relative: 9
Neutro Abs: 3.2

## 2010-11-20 LAB — CBC
HCT: 32.4 — ABNORMAL LOW
MCHC: 33.9
MCV: 87.1
MCV: 87.6
Platelets: 218
RBC: 3.89
RDW: 12.4
RDW: 12.6

## 2010-11-20 LAB — CREATININE, SERUM: GFR calc Af Amer: >60

## 2010-11-20 LAB — HCG, QUANTITATIVE, PREGNANCY
hCG, Beta Chain, Quant, S: 476 — ABNORMAL HIGH
hCG, Beta Chain, Quant, S: 515 — ABNORMAL HIGH

## 2010-11-20 LAB — BUN: BUN: 4 — ABNORMAL LOW

## 2010-11-21 LAB — T-HELPER CELL (CD4) - (RCID CLINIC ONLY): CD4 T Cell Abs: 540

## 2010-11-27 LAB — T-HELPER CELL (CD4) - (RCID CLINIC ONLY)
CD4 % Helper T Cell: 32 — ABNORMAL LOW
CD4 T Cell Abs: 400

## 2011-01-22 ENCOUNTER — Emergency Department (HOSPITAL_COMMUNITY)
Admission: EM | Admit: 2011-01-22 | Discharge: 2011-01-23 | Disposition: A | Discharge status: Home / Self Care | Payer: 59 | Attending: Emergency Medicine | Admitting: Emergency Medicine

## 2011-01-22 ENCOUNTER — Encounter (HOSPITAL_COMMUNITY): Payer: Self-pay | Admitting: Emergency Medicine

## 2011-01-22 DIAGNOSIS — R509 Fever, unspecified: Secondary | ICD-10-CM | POA: Insufficient documentation

## 2011-01-22 DIAGNOSIS — IMO0001 Reserved for inherently not codable concepts without codable children: Secondary | ICD-10-CM | POA: Insufficient documentation

## 2011-01-22 DIAGNOSIS — J111 Influenza due to unidentified influenza virus with other respiratory manifestations: Secondary | ICD-10-CM | POA: Insufficient documentation

## 2011-01-22 DIAGNOSIS — R51 Headache: Secondary | ICD-10-CM | POA: Insufficient documentation

## 2011-01-22 DIAGNOSIS — R05 Cough: Secondary | ICD-10-CM | POA: Insufficient documentation

## 2011-01-22 DIAGNOSIS — R1110 Vomiting, unspecified: Secondary | ICD-10-CM | POA: Insufficient documentation

## 2011-01-22 DIAGNOSIS — R059 Cough, unspecified: Secondary | ICD-10-CM | POA: Insufficient documentation

## 2011-01-22 NOTE — ED Notes (Signed)
Received pt. From triage, pt. Alert and oriented, pt. Ambulatory, gait steady, NAD noted

## 2011-01-22 NOTE — ED Notes (Signed)
PT. REPORTS VOMITTING , HEADACHE , BODY ACHES WITH FEVER FOR 3 DAYS WITH PRODUCTIVE COUGH .

## 2011-01-23 ENCOUNTER — Emergency Department (HOSPITAL_COMMUNITY): Payer: 59

## 2011-01-23 MED ORDER — BENZONATATE 200 MG PO CAPS: 200.0000 mg | ORAL_CAPSULE | Freq: Two times a day (BID) | ORAL | Status: AC | PRN

## 2011-01-23 NOTE — ED Notes (Signed)
Pt. Discharged to home, gait steady, NAD noted, respirations even and regular

## 2011-01-24 NOTE — ED Provider Notes (Signed)
Medical screening examination/treatment/procedure(s) were performed by non-physician practitioner and as supervising physician I was immediately available for consultation/collaboration.  Shelda Jakes, MD 01/24/11 [phone]

## 2011-01-24 NOTE — ED Provider Notes (Signed)
History     CSN: [id number] Arrival date & time: 01/22/2011  9:47 PM   First MD Initiated Contact with Patient 01/22/11 2343      Chief Complaint  Patient presents with  . Emesis    (Consider location/radiation/quality/duration/timing/severity/associated sxs/prior treatment) HPI Comments: She has a history significant for HIV.  Her last reported viral load was "undetectable".  Patient is a 35 y.o. female presenting with flu symptoms. The history is provided by the patient.  Influenza This is a new problem. Episode onset: 3 days ago. The problem occurs constantly. The problem has been unchanged. Associated symptoms include coughing, a fever, headaches and myalgias. Pertinent negatives include no abdominal pain or chest pain. Associated symptoms comments: She also reports post tussive emesis.. The symptoms are aggravated by nothing. She has tried NSAIDs for the symptoms. The treatment provided moderate (Temporary relief of myalgias and fever.) relief.    History reviewed. No pertinent past medical history.  History reviewed. No pertinent past surgical history.  Family History  Problem Relation Age of Onset  . Hypertension Mother     History  Substance Use Topics  . Smoking status: Current Everyday Smoker -- 0.3 packs/day for 16 years    Types: Cigarettes  . Smokeless tobacco: Never Used  . Alcohol Use: 2.4 oz/week    2 Glasses of wine, 2 Shots of liquor per week     occasional    OB History    Grav Para Term Preterm Abortions TAB SAB Ect Mult Living                  Review of Systems  Constitutional: Positive for fever.  HENT: Negative for ear pain.   Respiratory: Positive for cough. Negative for chest tightness and shortness of breath.   Cardiovascular: Negative for chest pain.  Gastrointestinal: Negative for abdominal pain.  Musculoskeletal: Positive for myalgias.  Neurological: Positive for headaches.    Allergies  Sulfonamide derivatives  Home Medications    Current Outpatient Rx  Name Route Sig Dispense Refill  . EMTRICITAB-RILPIVIR-TENOFOVIR 200-25-300 MG PO TABS Oral Take 1 tablet by mouth daily with supper. 30 tablet 11  . THERA M PLUS PO TABS Oral Take 1 tablet by mouth daily.      Marland Kitchen NAPROXEN SODIUM 220 MG PO TABS Oral Take 440 mg by mouth 2 (two) times daily with a meal. For fever/pain     . BENZONATATE 200 MG PO CAPS Oral Take 1 capsule (200 mg total) by mouth 2 (two) times daily as needed for cough. 21 capsule 0    BP 109/83  Pulse 83  Temp(Src) 99.4 F (37.4 C) (Oral)  Resp 18  SpO2 100%  LMP 01/17/2011  Physical Exam  Nursing note and vitals reviewed. Constitutional: She is oriented to person, place, and time. She appears well-developed and well-nourished. No distress.  HENT:  Head: Normocephalic and atraumatic.  Eyes: Conjunctivae are normal.  Neck: Normal range of motion.  Cardiovascular: Normal rate, regular rhythm, normal heart sounds and intact distal pulses.   Pulmonary/Chest: Effort normal and breath sounds normal. No respiratory distress. She has no wheezes. She has no rales. She exhibits no tenderness.       Occasional dry cough  Abdominal: Soft. Bowel sounds are normal. There is no tenderness.  Musculoskeletal: Normal range of motion.  Neurological: She is alert and oriented to person, place, and time.  Skin: Skin is warm and dry.  Psychiatric: She has a normal mood and affect.  ED Course  Procedures (including critical care time)  Labs Reviewed - No data to display Dg Chest 2 View  01/23/2011  *RADIOLOGY REPORT*  Clinical Data: Cough and fever.  CHEST - 2 VIEW  Comparison: None.  Findings: The lungs are clear without focal infiltrate, edema, pneumothorax or pleural effusion. The cardiopericardial silhouette is within normal limits for size. Imaged bony structures of the thorax are intact.  IMPRESSION: Normal exam.  Original Report Authenticated By: ERIC A. MANSELL, M.D.     1. Influenza       MDM   Patient in no distress, VSS.  Tolerating po fluids in ed.  Sx started 3 days ago,  tamiflu unlikely to be helpful.  Rest,  Fluids,  Tylenol or nsaids for fever,  Myalgias.  Prescribed tessalon to help with cough.  Encouraged close f/u is sx worsen,  Or fail to improve over next week.        Candis Musa, PA 01/24/11 1244

## 2011-04-02 ENCOUNTER — Telehealth: Payer: Self-pay | Admitting: Infectious Diseases

## 2011-04-02 ENCOUNTER — Other Ambulatory Visit: Payer: Self-pay | Admitting: *Deleted

## 2011-04-02 ENCOUNTER — Other Ambulatory Visit: Payer: 59

## 2011-04-02 DIAGNOSIS — Z113 Encounter for screening for infections with a predominantly sexual mode of transmission: Secondary | ICD-10-CM

## 2011-04-02 DIAGNOSIS — Z79899 Other long term (current) drug therapy: Secondary | ICD-10-CM

## 2011-04-02 DIAGNOSIS — B20 Human immunodeficiency virus [HIV] disease: Secondary | ICD-10-CM

## 2011-04-02 MED ORDER — EMTRICITAB-RILPIVIR-TENOFOV DF 200-25-300 MG PO TABS: 1.0000 | ORAL_TABLET | Freq: Every day | ORAL | Status: DC

## 2011-04-02 NOTE — Telephone Encounter (Signed)
Called [facility] to see if we could get emergency medication.  Will not give to patient with insurance.  Filled out application to fax and mail.  Per Rosanne Ashing with [employer] they do not help with deductibles.

## 2011-04-03 LAB — LIPID PANEL
Cholesterol: 183 mg/dL (ref 0–200)
LDL Cholesterol: 82 mg/dL (ref 0–99)
VLDL: 15 mg/dL (ref 0–40)

## 2011-04-03 LAB — CBC
Hemoglobin: 13 g/dL (ref 12.0–15.0)
MCH: 27.9 pg (ref 26.0–34.0)
RBC: 4.66 MIL/uL (ref 3.87–5.11)
WBC: 6.8 10*3/uL (ref 4.0–10.5)

## 2011-04-03 LAB — T-HELPER CELL (CD4) - (RCID CLINIC ONLY)
CD4 % Helper T Cell: 26 % — ABNORMAL LOW (ref 33–55)
CD4 T Cell Abs: 590 uL (ref 400–2700)

## 2011-04-03 LAB — COMPREHENSIVE METABOLIC PANEL
ALT: 18 U/L (ref 0–35)
AST: 24 U/L (ref 0–37)
CO2: 26 mEq/L (ref 19–32)
Chloride: 102 mEq/L (ref 96–112)
Sodium: 137 mEq/L (ref 135–145)
Total Bilirubin: 0.5 mg/dL (ref 0.3–1.2)
Total Protein: 7.7 g/dL (ref 6.0–8.3)

## 2011-04-03 LAB — RPR

## 2011-04-04 LAB — HIV-1 RNA QUANT-NO REFLEX-BLD
HIV 1 RNA Quant: <20 copies/mL (ref <20)
HIV-1 RNA Quant, Log: <1.3 {Log} (ref <1.30)

## 2011-04-07 ENCOUNTER — Telehealth: Payer: Self-pay | Admitting: Infectious Diseases

## 2011-04-07 NOTE — Telephone Encounter (Signed)
Received fax from [employer] wanting addition information regarding patient.  Gave info over the phone.  Should hear back within 5 days.  Her application is being sent to special request department.

## 2011-04-10 ENCOUNTER — Telehealth: Payer: Self-pay | Admitting: Infectious Diseases

## 2011-04-10 NOTE — Telephone Encounter (Signed)
Received call from [employer].  Ms. Fritcher application for special request was denied.  Called patient and informed her.

## 2011-04-16 ENCOUNTER — Ambulatory Visit (INDEPENDENT_AMBULATORY_CARE_PROVIDER_SITE_OTHER): Payer: 59 | Admitting: Infectious Diseases

## 2011-04-16 ENCOUNTER — Encounter: Payer: Self-pay | Admitting: Infectious Diseases

## 2011-04-16 DIAGNOSIS — F17200 Nicotine dependence, unspecified, uncomplicated: Secondary | ICD-10-CM

## 2011-04-16 DIAGNOSIS — Z720 Tobacco use: Secondary | ICD-10-CM

## 2011-04-16 DIAGNOSIS — B20 Human immunodeficiency virus [HIV] disease: Secondary | ICD-10-CM

## 2011-04-16 DIAGNOSIS — R87613 High grade squamous intraepithelial lesion on cytologic smear of cervix (HGSIL): Secondary | ICD-10-CM

## 2011-04-16 NOTE — Assessment & Plan Note (Signed)
She has quit smoking with recent episode of the flu. Congratulated her.

## 2011-04-16 NOTE — Progress Notes (Signed)
  Subjective:    Patient ID: Jacqueline Dawson, female    DOB: 01-30-1976, 36 y.o.   MRN: [id number]  HPI 36 yo F with hx of HIV+ and previous LEEP ~ years ago. She had normal PAP Aug-12. At previous visit was changed from Atripla to Complera. No further abn dreams, no side effects.   HIV 1 RNA Quant (copies/mL)  Date Value  04/02/2011 <20   06/25/2010 <20   11/21/2009 <20 copies/mL      CD4 T Cell Abs (cmm)  Date Value  04/02/2011 590   06/25/2010 600   11/21/2009 640     Lab Results  Component Value Date   CHOL 183 04/02/2011   HDL 86 04/02/2011   LDLCALC 82 04/02/2011   TRIG 73 04/02/2011   CHOLHDL 2.1 04/02/2011        Review of Systems  Constitutional: Negative for fever, chills, appetite change and unexpected weight change.  Gastrointestinal: Negative for diarrhea and constipation.  Genitourinary: Negative for dysuria and menstrual problem.       Objective:   Physical Exam  Constitutional: She appears well-developed and well-nourished.  HENT:  Mouth/Throat: Oropharynx is clear and moist. No oropharyngeal exudate.  Eyes: EOM are normal. Pupils are equal, round, and reactive to light.  Neck: Neck supple.  Cardiovascular: Normal rate, regular rhythm and normal heart sounds.   Pulmonary/Chest: Effort normal and breath sounds normal.  Abdominal: Soft. Bowel sounds are normal. There is no tenderness.  Lymphadenopathy:    She has no cervical adenopathy.          Assessment & Plan:

## 2011-04-16 NOTE — Assessment & Plan Note (Signed)
She is doing very well on new rx. Will continue this on her. She is given condoms. Will see her back in 6 month with labs. She is wearing her seat belt. States she is exercising by chasing her 36 year old around.

## 2011-04-16 NOTE — Assessment & Plan Note (Signed)
She will schedule repeat PAP. Last was NL.

## 2011-08-26 ENCOUNTER — Other Ambulatory Visit: Payer: Self-pay | Admitting: Licensed Clinical Social Worker

## 2011-08-26 DIAGNOSIS — B20 Human immunodeficiency virus [HIV] disease: Secondary | ICD-10-CM

## 2011-08-26 MED ORDER — EMTRICITAB-RILPIVIR-TENOFOV DF 200-25-300 MG PO TABS: 1.0000 | ORAL_TABLET | Freq: Every day | ORAL | Status: DC

## 2011-09-11 ENCOUNTER — Telehealth: Payer: Self-pay | Admitting: *Deleted

## 2011-09-11 NOTE — Telephone Encounter (Signed)
Appointments made for the pt.

## 2011-10-17 ENCOUNTER — Ambulatory Visit: Payer: 59

## 2011-10-17 ENCOUNTER — Ambulatory Visit (INDEPENDENT_AMBULATORY_CARE_PROVIDER_SITE_OTHER): Payer: 59 | Admitting: *Deleted

## 2011-10-17 DIAGNOSIS — B20 Human immunodeficiency virus [HIV] disease: Secondary | ICD-10-CM

## 2011-10-17 DIAGNOSIS — Z124 Encounter for screening for malignant neoplasm of cervix: Secondary | ICD-10-CM

## 2011-10-17 LAB — CBC
MCH: 27.7 pg (ref 26.0–34.0)
MCHC: 34.9 g/dL (ref 30.0–36.0)
Platelets: 258 10*3/uL (ref 150–400)
RDW: 12.3 % (ref 11.5–15.5)

## 2011-10-17 LAB — COMPREHENSIVE METABOLIC PANEL
AST: 19 U/L (ref 0–37)
Albumin: 4.2 g/dL (ref 3.5–5.2)
Alkaline Phosphatase: 40 U/L (ref 39–117)
Potassium: 4.3 mEq/L (ref 3.5–5.3)
Sodium: 139 mEq/L (ref 135–145)
Total Bilirubin: 0.3 mg/dL (ref 0.3–1.2)
Total Protein: 6.9 g/dL (ref 6.0–8.3)

## 2011-10-17 NOTE — Progress Notes (Signed)
  Subjective:     Jacqueline Dawson is a 36 y.o. woman who comes in today for a  pap smear only. Previous abnormal Pap smears: yes. Contraception: condoms  Objective:    There were no vitals taken for this visit. Pelvic Exam:  Pap smear obtained.   Assessment:    Screening pap smear.   Plan:    Follow up in one year"}, or as indicated by Pap results.  Pt given condoms.  Pt given educational materials re:  HIV and women, heart disease, nutrition, diet, exercise, PAP smears, self-esteem, and partner protection.  BSE card given.

## 2011-10-17 NOTE — Patient Instructions (Signed)
Your results will be ready in about a week.  I will mail them to you.  Thank you for coming to the Center for your care.  Kolbe Delmonaco,  RN 

## 2011-10-23 ENCOUNTER — Encounter: Payer: Self-pay | Admitting: *Deleted

## 2011-11-14 ENCOUNTER — Ambulatory Visit: Payer: 59 | Admitting: Infectious Diseases

## 2011-11-18 ENCOUNTER — Ambulatory Visit: Payer: 59 | Admitting: Infectious Diseases

## 2011-11-19 ENCOUNTER — Encounter: Payer: Self-pay | Admitting: Infectious Diseases

## 2011-11-19 ENCOUNTER — Ambulatory Visit: Payer: 59 | Admitting: Infectious Diseases

## 2011-11-19 ENCOUNTER — Other Ambulatory Visit: Payer: Self-pay | Admitting: *Deleted

## 2011-11-19 ENCOUNTER — Ambulatory Visit (INDEPENDENT_AMBULATORY_CARE_PROVIDER_SITE_OTHER): Payer: 59 | Admitting: Infectious Diseases

## 2011-11-19 VITALS — BP 137/86 | HR 87 | Temp 97.8°F | Wt 127.0 lb

## 2011-11-19 DIAGNOSIS — B20 Human immunodeficiency virus [HIV] disease: Secondary | ICD-10-CM

## 2011-11-19 DIAGNOSIS — Z79899 Other long term (current) drug therapy: Secondary | ICD-10-CM

## 2011-11-19 DIAGNOSIS — Z113 Encounter for screening for infections with a predominantly sexual mode of transmission: Secondary | ICD-10-CM

## 2011-11-19 DIAGNOSIS — Z23 Encounter for immunization: Secondary | ICD-10-CM

## 2011-11-19 DIAGNOSIS — R87613 High grade squamous intraepithelial lesion on cytologic smear of cervix (HGSIL): Secondary | ICD-10-CM

## 2011-11-19 MED ORDER — EMTRICITAB-RILPIVIR-TENOFOV DF 200-25-300 MG PO TABS: 1.0000 | ORAL_TABLET | Freq: Every day | ORAL | Status: DC

## 2011-11-19 MED ORDER — CYANOCOBALAMIN 1000 MCG/ML IJ SOLN
1000.0000 ug | Freq: Once | INTRAMUSCULAR | Status: AC
  Administered 2011-11-19: 1000 ug via INTRAMUSCULAR

## 2011-11-19 MED ORDER — CYANOCOBALAMIN 1000 MCG/ML IJ SOLN: 1000.0000 ug | Freq: Once | INTRAMUSCULAR | Status: DC

## 2011-11-19 NOTE — Addendum Note (Signed)
Addended by: Lurlean Leyden on: 11/19/2011 09:45 AM   Modules accepted: Orders

## 2011-11-19 NOTE — Progress Notes (Signed)
  Subjective:    Patient ID: Jacqueline Dawson, female    DOB: 05/17/1975, 36 y.o.   MRN: [id number]  HPI 36 yo F with hx of HIV+ and previous LEEP ~ years ago. She had normal PAP Sept 2013.  At previous visit was changed from Atripla to Complera.  HIV 1 RNA Quant (copies/mL)  Date Value  10/17/2011 40*  04/02/2011 <20   06/25/2010 <20      CD4 T Cell Abs (cmm)  Date Value  10/17/2011 330*  04/02/2011 590   06/25/2010 600    Feeling well, meds going well. Has gained- 18# which is what she wanted to do.    Review of Systems  Constitutional: Negative for fever, chills, appetite change and unexpected weight change.  Respiratory: Negative for shortness of breath.   Cardiovascular: Negative for chest pain.  Gastrointestinal: Negative for nausea and diarrhea.  Genitourinary: Negative for dysuria.  Neurological: Negative for headaches.       Objective:   Physical Exam  Constitutional: She appears well-developed and well-nourished.  HENT:  Mouth/Throat: No oropharyngeal exudate.  Eyes: EOM are normal. Pupils are equal, round, and reactive to light.  Neck: Neck supple.  Cardiovascular: Normal rate, regular rhythm and normal heart sounds.   Pulmonary/Chest: Effort normal and breath sounds normal.  Abdominal: Soft. Bowel sounds are normal. There is no tenderness.  Musculoskeletal: She exhibits no edema.  Lymphadenopathy:    She has no cervical adenopathy.          Assessment & Plan:

## 2011-11-19 NOTE — Assessment & Plan Note (Signed)
Her lasty 2 pap have been normal. Will continue yearly screening.

## 2011-11-19 NOTE — Assessment & Plan Note (Addendum)
She has had some loss of CD4 and has minimal detectable virus. Will continue her on her current rx which she likes. She swears adherence and likes her new rx. She is offered condoms, got them at PAP visit. She gets a B12 shot today as well as flu shot. She will rtc in 6 months.

## 2012-05-05 ENCOUNTER — Other Ambulatory Visit: Payer: 59

## 2012-05-07 ENCOUNTER — Other Ambulatory Visit: Payer: 59

## 2012-05-07 DIAGNOSIS — B20 Human immunodeficiency virus [HIV] disease: Secondary | ICD-10-CM

## 2012-05-07 DIAGNOSIS — Z113 Encounter for screening for infections with a predominantly sexual mode of transmission: Secondary | ICD-10-CM

## 2012-05-07 DIAGNOSIS — Z79899 Other long term (current) drug therapy: Secondary | ICD-10-CM

## 2012-05-07 LAB — LIPID PANEL
HDL: 55 mg/dL (ref >39)
LDL Cholesterol: 93 mg/dL (ref 0–99)

## 2012-05-07 LAB — COMPREHENSIVE METABOLIC PANEL
AST: 17 U/L (ref 0–37)
Albumin: 3.9 g/dL (ref 3.5–5.2)
Alkaline Phosphatase: 51 U/L (ref 39–117)
BUN: 10 mg/dL (ref 6–23)
Calcium: 9.5 mg/dL (ref 8.4–10.5)
Chloride: 104 mEq/L (ref 96–112)
Glucose, Bld: 90 mg/dL (ref 70–99)
Potassium: 4.5 mEq/L (ref 3.5–5.3)
Sodium: 137 mEq/L (ref 135–145)
Total Protein: 6.8 g/dL (ref 6.0–8.3)

## 2012-05-07 LAB — T-HELPER CELL (CD4) - (RCID CLINIC ONLY): CD4 % Helper T Cell: 27 % — ABNORMAL LOW (ref 33–55)

## 2012-05-07 LAB — CBC
HCT: 36.5 % (ref 36.0–46.0)
Hemoglobin: 12.3 g/dL (ref 12.0–15.0)
MCHC: 33.7 g/dL (ref 30.0–36.0)
RDW: 13.3 % (ref 11.5–15.5)
WBC: 6.3 10*3/uL (ref 4.0–10.5)

## 2012-05-07 LAB — RPR

## 2012-05-10 LAB — HIV-1 RNA QUANT-NO REFLEX-BLD
HIV 1 RNA Quant: <20 copies/mL (ref <20)
HIV-1 RNA Quant, Log: <1.3 {Log} (ref <1.30)

## 2012-05-19 ENCOUNTER — Ambulatory Visit (INDEPENDENT_AMBULATORY_CARE_PROVIDER_SITE_OTHER): Payer: 59 | Admitting: Infectious Diseases

## 2012-05-19 ENCOUNTER — Encounter: Payer: Self-pay | Admitting: Infectious Diseases

## 2012-05-19 VITALS — BP 133/86 | HR 105 | Temp 98.3°F | Ht 66.0 in | Wt 133.0 lb

## 2012-05-19 DIAGNOSIS — Z23 Encounter for immunization: Secondary | ICD-10-CM

## 2012-05-19 DIAGNOSIS — E538 Deficiency of other specified B group vitamins: Secondary | ICD-10-CM

## 2012-05-19 DIAGNOSIS — N920 Excessive and frequent menstruation with regular cycle: Secondary | ICD-10-CM

## 2012-05-19 DIAGNOSIS — R87613 High grade squamous intraepithelial lesion on cytologic smear of cervix (HGSIL): Secondary | ICD-10-CM

## 2012-05-19 DIAGNOSIS — B20 Human immunodeficiency virus [HIV] disease: Secondary | ICD-10-CM

## 2012-05-19 MED ORDER — CYANOCOBALAMIN 1000 MCG/ML IJ SOLN
1000.0000 ug | Freq: Once | INTRAMUSCULAR | Status: AC
  Administered 2012-05-19: 1000 ug via INTRAMUSCULAR

## 2012-05-19 MED ORDER — CYANOCOBALAMIN 1000 MCG/ML IJ SOLN: 1000.0000 ug | Freq: Once | INTRAMUSCULAR | Status: DC

## 2012-05-19 NOTE — Assessment & Plan Note (Signed)
She is doing great. Will continue her current art. She is going to sched her pap for October, will see her back for ID in 6 months. She is offered/refused condoms.

## 2012-05-19 NOTE — Progress Notes (Signed)
  Subjective:    Patient ID: Jacqueline Dawson, female    DOB: 1976/02/05, 37 y.o.   MRN: [id number]  HPI 37 yo F with hx of HIV+ and previous abn PAP/LEEP. She had normal PAP Sept 2013.  At previous visit was changed from Atripla to Complera. Doing well, taking medications well, with food.  Wt has stabilized.   HIV 1 RNA Quant (copies/mL)  Date Value  05/07/2012 <20   10/17/2011 40*  04/02/2011 <20      CD4 T Cell Abs (cmm)  Date Value  05/07/2012 480   10/17/2011 330*  04/02/2011 590       Review of Systems  Constitutional: Negative for appetite change and unexpected weight change.  Eyes: Negative for visual disturbance.  Gastrointestinal: Negative for diarrhea and constipation.  Genitourinary: Negative for difficulty urinating and menstrual problem.       Objective:   Physical Exam  Constitutional: She appears well-developed and well-nourished.  HENT:  Mouth/Throat: No oropharyngeal exudate.  Eyes: EOM are normal. Pupils are equal, round, and reactive to light.  Neck: Neck supple.  Cardiovascular: Normal rate, regular rhythm and normal heart sounds.   Pulmonary/Chest: Effort normal and breath sounds normal.  Abdominal: Soft. Bowel sounds are normal. There is no tenderness.  Lymphadenopathy:    She has no cervical adenopathy.          Assessment & Plan:

## 2012-05-19 NOTE — Addendum Note (Signed)
Addended by: Andree Coss on: 05/19/2012 09:46 AM   Modules accepted: Orders, Medications

## 2012-05-19 NOTE — Assessment & Plan Note (Signed)
Has improved and become more regular. Will mark as resolved.

## 2012-05-19 NOTE — Assessment & Plan Note (Signed)
Last normal. Will have repeat in October.

## 2012-11-10 ENCOUNTER — Other Ambulatory Visit (INDEPENDENT_AMBULATORY_CARE_PROVIDER_SITE_OTHER): Payer: 59

## 2012-11-10 DIAGNOSIS — B20 Human immunodeficiency virus [HIV] disease: Secondary | ICD-10-CM

## 2012-11-10 LAB — COMPREHENSIVE METABOLIC PANEL
ALT: 15 U/L (ref 0–35)
AST: 17 U/L (ref 0–37)
Albumin: 4.4 g/dL (ref 3.5–5.2)
Alkaline Phosphatase: 57 U/L (ref 39–117)
BUN: 12 mg/dL (ref 6–23)
Calcium: 9.8 mg/dL (ref 8.4–10.5)
Chloride: 101 mEq/L (ref 96–112)
Potassium: 4.6 mEq/L (ref 3.5–5.3)
Sodium: 134 mEq/L — ABNORMAL LOW (ref 135–145)
Total Protein: 7.8 g/dL (ref 6.0–8.3)

## 2012-11-10 LAB — CBC
HCT: 39.1 % (ref 36.0–46.0)
MCH: 26.6 pg (ref 26.0–34.0)
RDW: 13.6 % (ref 11.5–15.5)
WBC: 8.6 10*3/uL (ref 4.0–10.5)

## 2012-11-11 LAB — T-HELPER CELL (CD4) - (RCID CLINIC ONLY)
CD4 % Helper T Cell: 19 % — ABNORMAL LOW (ref 33–55)
CD4 T Cell Abs: 340 /uL — ABNORMAL LOW (ref 400–2700)

## 2012-11-24 ENCOUNTER — Encounter: Payer: Self-pay | Admitting: Infectious Diseases

## 2012-11-24 ENCOUNTER — Ambulatory Visit (INDEPENDENT_AMBULATORY_CARE_PROVIDER_SITE_OTHER): Payer: 59 | Admitting: Infectious Diseases

## 2012-11-24 VITALS — BP 134/91 | HR 87 | Temp 98.2°F | Ht 66.0 in | Wt 136.0 lb

## 2012-11-24 DIAGNOSIS — B20 Human immunodeficiency virus [HIV] disease: Secondary | ICD-10-CM

## 2012-11-24 DIAGNOSIS — R87613 High grade squamous intraepithelial lesion on cytologic smear of cervix (HGSIL): Secondary | ICD-10-CM

## 2012-11-24 DIAGNOSIS — Z79899 Other long term (current) drug therapy: Secondary | ICD-10-CM

## 2012-11-24 DIAGNOSIS — Z23 Encounter for immunization: Secondary | ICD-10-CM

## 2012-11-24 DIAGNOSIS — Z113 Encounter for screening for infections with a predominantly sexual mode of transmission: Secondary | ICD-10-CM

## 2012-11-24 NOTE — Assessment & Plan Note (Addendum)
She is doing well, some concern about decrease in CD4 and blip. Needs hep B SAb with next lab. She gets flu shot today. She is offered/refuses condoms. Will see her back in 6 months.

## 2012-11-24 NOTE — Assessment & Plan Note (Signed)
She will make f/u with Angelique Blonder

## 2012-11-24 NOTE — Progress Notes (Signed)
  Subjective:    Patient ID: Jacqueline Dawson, female    DOB: 1975-04-03, 37 y.o.   MRN: [id number]  HPI 37 yo F with hx of HIV+ and previous abn PAP/LEEP. She had normal PAP Sept 2013. Will schedule when denise retruns.  At previous visit was changed from Atripla to Complera. No problems with meds.  37 yo aughter brought home cough. Now pt has persistent cough, rhinorrhea. Has had sweats as well. Sleeps with fan at night.   HIV 1 RNA Quant (copies/mL)  Date Value  11/10/2012 72*  05/07/2012 <20   10/17/2011 40*     CD4 T Cell Abs (/uL)  Date Value  11/10/2012 340*  05/07/2012 480   10/17/2011 330*    Review of Systems  Constitutional: Negative for appetite change and unexpected weight change.  Gastrointestinal: Negative for diarrhea and constipation.  Genitourinary: Negative for difficulty urinating and menstrual problem.       Objective:   Physical Exam  Constitutional: She appears well-developed and well-nourished.  HENT:  Mouth/Throat: No oropharyngeal exudate.  Eyes: EOM are normal. Pupils are equal, round, and reactive to light.  Neck: Neck supple.  Cardiovascular: Normal rate, regular rhythm and normal heart sounds.   Pulmonary/Chest: Effort normal and breath sounds normal. No respiratory distress. She has no wheezes. She has no rales.  Abdominal: Soft. Bowel sounds are normal. She exhibits no distension. There is no tenderness.  Lymphadenopathy:    She has no cervical adenopathy.          Assessment & Plan:

## 2012-12-08 ENCOUNTER — Other Ambulatory Visit: Payer: Self-pay | Admitting: Infectious Diseases

## 2012-12-28 ENCOUNTER — Telehealth: Payer: Self-pay | Admitting: *Deleted

## 2012-12-28 NOTE — Telephone Encounter (Signed)
Appointment made

## 2013-01-17 ENCOUNTER — Ambulatory Visit: Payer: 59

## 2013-01-31 ENCOUNTER — Ambulatory Visit (INDEPENDENT_AMBULATORY_CARE_PROVIDER_SITE_OTHER): Payer: 59 | Admitting: *Deleted

## 2013-01-31 DIAGNOSIS — N898 Other specified noninflammatory disorders of vagina: Secondary | ICD-10-CM

## 2013-01-31 DIAGNOSIS — Z124 Encounter for screening for malignant neoplasm of cervix: Secondary | ICD-10-CM

## 2013-01-31 DIAGNOSIS — Z113 Encounter for screening for infections with a predominantly sexual mode of transmission: Secondary | ICD-10-CM

## 2013-01-31 NOTE — Patient Instructions (Signed)
Your results will be ready in about a week.  I will mail them to you.  Thank you for coming to the Center for you care.  Happy Holidays!  Angelique Blonder, RN

## 2013-01-31 NOTE — Progress Notes (Signed)
  Subjective:     Jacqueline Dawson is a 37 y.o. woman who comes in today for a  pap smear only.  Previous abnormal Pap smears: yes, followed by a COLPO. Contraception: condoms.  Objective:    There were no vitals taken for this visit.  LMP 01/07/13, normal for pt. Pelvic Exam:  Pap smear obtained.   Assessment:    Screening pap smear.   Plan:    Follow up in one year, or as indicated by Pap results.  Pt given educational materials re: HIV and women, self-esteem, BSE, nutrition and diet management, PAP smears and partner safety. Pt given condoms.

## 2013-02-07 ENCOUNTER — Encounter: Payer: Self-pay | Admitting: *Deleted

## 2013-02-11 ENCOUNTER — Encounter: Payer: Self-pay | Admitting: *Deleted

## 2013-05-11 ENCOUNTER — Other Ambulatory Visit: Payer: 59

## 2013-05-11 DIAGNOSIS — Z113 Encounter for screening for infections with a predominantly sexual mode of transmission: Secondary | ICD-10-CM

## 2013-05-11 DIAGNOSIS — Z79899 Other long term (current) drug therapy: Secondary | ICD-10-CM

## 2013-05-11 DIAGNOSIS — B20 Human immunodeficiency virus [HIV] disease: Secondary | ICD-10-CM

## 2013-05-11 LAB — COMPLETE METABOLIC PANEL WITH GFR
ALT: 13 U/L (ref 0–35)
AST: 17 U/L (ref 0–37)
Albumin: 4.4 g/dL (ref 3.5–5.2)
Alkaline Phosphatase: 45 U/L (ref 39–117)
BILIRUBIN TOTAL: 0.4 mg/dL (ref 0.2–1.2)
BUN: 10 mg/dL (ref 6–23)
CO2: 24 meq/L (ref 19–32)
CREATININE: 0.76 mg/dL (ref 0.50–1.10)
Calcium: 9.4 mg/dL (ref 8.4–10.5)
Chloride: 104 mEq/L (ref 96–112)
GFR, Est Non African American: >89 mL/min
GLUCOSE: 84 mg/dL (ref 70–99)
Potassium: 4.3 mEq/L (ref 3.5–5.3)
Sodium: 135 mEq/L (ref 135–145)
Total Protein: 7.6 g/dL (ref 6.0–8.3)

## 2013-05-11 LAB — LIPID PANEL
CHOLESTEROL: 158 mg/dL (ref 0–200)
HDL: 63 mg/dL (ref >39)
LDL Cholesterol: 82 mg/dL (ref 0–99)
Total CHOL/HDL Ratio: 2.5 Ratio
Triglycerides: 63 mg/dL (ref <150)
VLDL: 13 mg/dL (ref 0–40)

## 2013-05-11 LAB — CBC WITH DIFFERENTIAL/PLATELET
Basophils Absolute: 0 10*3/uL (ref 0.0–0.1)
Basophils Relative: 0 % (ref 0–1)
EOS ABS: 0.1 10*3/uL (ref 0.0–0.7)
EOS PCT: 1 % (ref 0–5)
HEMATOCRIT: 39.4 % (ref 36.0–46.0)
Hemoglobin: 13.2 g/dL (ref 12.0–15.0)
LYMPHS ABS: 2.3 10*3/uL (ref 0.7–4.0)
Lymphocytes Relative: 36 % (ref 12–46)
MCH: 27.4 pg (ref 26.0–34.0)
MCHC: 33.5 g/dL (ref 30.0–36.0)
MCV: 81.9 fL (ref 78.0–100.0)
MONO ABS: 0.5 10*3/uL (ref 0.1–1.0)
MONOS PCT: 8 % (ref 3–12)
Neutro Abs: 3.6 10*3/uL (ref 1.7–7.7)
Neutrophils Relative %: 55 % (ref 43–77)
Platelets: 260 10*3/uL (ref 150–400)
RBC: 4.81 MIL/uL (ref 3.87–5.11)
RDW: 13.3 % (ref 11.5–15.5)
WBC: 6.5 10*3/uL (ref 4.0–10.5)

## 2013-05-11 LAB — HEPATITIS B SURFACE ANTIBODY,QUALITATIVE: Hep B S Ab: NEGATIVE

## 2013-05-12 LAB — T-HELPER CELL (CD4) - (RCID CLINIC ONLY)
CD4 T CELL ABS: 420 /uL (ref 400–2700)
CD4 T CELL HELPER: 18 % — AB (ref 33–55)

## 2013-05-12 LAB — HIV-1 RNA QUANT-NO REFLEX-BLD
HIV 1 RNA QUANT: 112 {copies}/mL — AB (ref <20)
HIV-1 RNA QUANT, LOG: 2.05 {Log} — AB (ref <1.30)

## 2013-05-12 LAB — RPR

## 2013-05-25 ENCOUNTER — Ambulatory Visit: Payer: 59 | Admitting: Infectious Diseases

## 2013-07-13 ENCOUNTER — Ambulatory Visit: Payer: 59 | Admitting: Infectious Diseases

## 2013-07-25 ENCOUNTER — Ambulatory Visit: Payer: 59 | Admitting: Infectious Diseases

## 2013-08-02 ENCOUNTER — Ambulatory Visit (INDEPENDENT_AMBULATORY_CARE_PROVIDER_SITE_OTHER): Payer: 59 | Admitting: Infectious Diseases

## 2013-08-02 ENCOUNTER — Other Ambulatory Visit: Payer: Self-pay | Admitting: *Deleted

## 2013-08-02 ENCOUNTER — Encounter: Payer: Self-pay | Admitting: Infectious Diseases

## 2013-08-02 VITALS — BP 133/86 | HR 91 | Temp 98.4°F | Wt 116.0 lb

## 2013-08-02 DIAGNOSIS — R87613 High grade squamous intraepithelial lesion on cytologic smear of cervix (HGSIL): Secondary | ICD-10-CM

## 2013-08-02 DIAGNOSIS — F329 Major depressive disorder, single episode, unspecified: Secondary | ICD-10-CM

## 2013-08-02 DIAGNOSIS — B20 Human immunodeficiency virus [HIV] disease: Secondary | ICD-10-CM

## 2013-08-02 DIAGNOSIS — Z23 Encounter for immunization: Secondary | ICD-10-CM

## 2013-08-02 DIAGNOSIS — F3289 Other specified depressive episodes: Secondary | ICD-10-CM

## 2013-08-02 DIAGNOSIS — N898 Other specified noninflammatory disorders of vagina: Secondary | ICD-10-CM | POA: Insufficient documentation

## 2013-08-02 MED ORDER — SERTRALINE HCL 25 MG PO TABS: 25.0000 mg | ORAL_TABLET | Freq: Every day | ORAL | Status: DC

## 2013-08-02 NOTE — Progress Notes (Signed)
   Subjective:    Patient ID: Jacqueline Dawson, female    DOB: 1975-08-01, 38 y.o.   MRN: [id number]  HPI 38 yo F with hx of HIV+ and previous abn PAP/LEEP. She had normal PAP 01-2013.  Previously was changed from [place] to [place]. Denies missed doses or problems with ART.   HIV 1 RNA Quant (copies/mL)  Date Value  05/11/2013 112*  11/10/2012 72*  05/07/2012 <20      CD4 T Cell Abs (/uL)  Date Value  05/11/2013 420   11/10/2012 340*  05/07/2012 480    has lost 20#. Had break-up in Nov-Dec which caused her stress. Busy with her kids 27yo, 21yo (not giving her problems). Works 2 jobs. Has had significant family stressors- sisters partner murdered, mother had CVA (now back at work). Sleeping poorly, has been taking alleve PM.   Review of Systems  Constitutional: Positive for unexpected weight change. Negative for appetite change.  Gastrointestinal: Negative for diarrhea and constipation.  Genitourinary: Positive for vaginal discharge. Negative for difficulty urinating and menstrual problem.       Objective:   Physical Exam  Constitutional: She appears well-developed and well-nourished.  HENT:  Mouth/Throat: No oropharyngeal exudate.  Eyes: EOM are normal. Pupils are equal, round, and reactive to light.  Neck: Neck supple.  Cardiovascular: Normal rate, regular rhythm and normal heart sounds.   Pulmonary/Chest: Effort normal and breath sounds normal.  Abdominal: Soft. Bowel sounds are normal. She exhibits no distension. There is no tenderness.  Lymphadenopathy:    She has no cervical adenopathy.  Psychiatric: She has a normal mood and affect. Her behavior is normal.          Assessment & Plan:

## 2013-08-02 NOTE — Assessment & Plan Note (Signed)
Will give her trial of zoloft, start at 25mg . Also will get her appt with Grayland Ormond. She will try melatonin. Will see her back in 6 weeks.

## 2013-08-02 NOTE — Assessment & Plan Note (Signed)
Offered refused/condoms. Will check her HLA. Screen her to start triumeq. She has had 2 normal paps, will need repeat in fall. Restart Hep B S Ab. rtc 6 weeks.  

## 2013-08-02 NOTE — Addendum Note (Signed)
Addended by: Myrtis Hopping A on: 08/02/2013 05:09 PM   Modules accepted: Orders

## 2013-08-02 NOTE — Addendum Note (Signed)
Addended by: HATCHER, JEFFREY C on: 08/02/2013 05:04 PM   Modules accepted: Orders

## 2013-08-02 NOTE — Assessment & Plan Note (Signed)
Will send for UA, UCx. gc, chlamydia, rpr.  Will defer treatment at this point.

## 2013-08-02 NOTE — Assessment & Plan Note (Signed)
Has had normal x2. Back to routine screening. mammo next year.

## 2013-08-03 ENCOUNTER — Other Ambulatory Visit: Payer: Self-pay | Admitting: *Deleted

## 2013-08-03 ENCOUNTER — Telehealth: Payer: Self-pay | Admitting: *Deleted

## 2013-08-03 DIAGNOSIS — N760 Acute vaginitis: Principal | ICD-10-CM

## 2013-08-03 DIAGNOSIS — B9689 Other specified bacterial agents as the cause of diseases classified elsewhere: Secondary | ICD-10-CM

## 2013-08-03 LAB — URINALYSIS, MICROSCOPIC ONLY: Casts: NONE SEEN

## 2013-08-03 LAB — URINALYSIS, ROUTINE W REFLEX MICROSCOPIC
Bilirubin Urine: NEGATIVE
GLUCOSE, UA: NEGATIVE mg/dL
Ketones, ur: NEGATIVE mg/dL
Nitrite: POSITIVE — AB
Protein, ur: NEGATIVE mg/dL
Specific Gravity, Urine: 1.022 (ref 1.005–1.030)
Urobilinogen, UA: 1 mg/dL (ref 0.0–1.0)
pH: 7 (ref 5.0–8.0)

## 2013-08-03 LAB — RPR

## 2013-08-03 MED ORDER — CIPROFLOXACIN HCL 500 MG PO TABS: 500.0000 mg | ORAL_TABLET | Freq: Two times a day (BID) | ORAL | Status: DC

## 2013-08-03 NOTE — Telephone Encounter (Signed)
She has bacteria in her urine, try cipro 500 mg twice a day for 3 days while we wait for the rest of the results.  thanks

## 2013-08-03 NOTE — Telephone Encounter (Signed)
Patient called for results of her urinalysis. She is having vaginal discharge and burning on urination. Explained that the culture and cytology are not back yet. Please advise on results that are in. Jacqueline Dawson

## 2013-08-03 NOTE — Telephone Encounter (Signed)
Rx sent to [facility] and patient notified

## 2013-08-04 ENCOUNTER — Ambulatory Visit: Payer: 59

## 2013-08-04 LAB — URINE CULTURE: Colony Count: >=100000

## 2013-08-08 ENCOUNTER — Ambulatory Visit: Payer: 59

## 2013-08-08 DIAGNOSIS — F411 Generalized anxiety disorder: Secondary | ICD-10-CM

## 2013-08-08 DIAGNOSIS — F331 Major depressive disorder, recurrent, moderate: Secondary | ICD-10-CM

## 2013-08-08 LAB — HLA B*5701: HLA-B*5701 w/rflx HLA-B High: NEGATIVE

## 2013-08-08 NOTE — Progress Notes (Signed)
I met with Jacqueline Dawson for the second time today and we completed a treatment plan with goals to reduce anxiety and improve mood.  She said she has already given mindfulness a try and has had some success with it.  She said she has tried 3 mg of melatonin, but still could not sleep, so she plans to try doubling this tonight.  I provided some psycho-education on breathing from the belly and also gave her a list of Quay Burow' "Cognitive Distortions" and discussed this with her.  Plan to meet in 6 weeks or sooner if the schedule allows.

## 2013-09-02 ENCOUNTER — Telehealth: Payer: Self-pay | Admitting: Licensed Clinical Social Worker

## 2013-09-02 DIAGNOSIS — R350 Frequency of micturition: Secondary | ICD-10-CM

## 2013-09-02 NOTE — Telephone Encounter (Signed)
Patient states that she is still having urinary frequency, discharge, foul odor and lower back pain. Per Dr. Johnnye Sima she will need to have a repeat urine culture. Patient is coming in on Monday.

## 2013-09-05 ENCOUNTER — Other Ambulatory Visit: Payer: 59

## 2013-09-05 DIAGNOSIS — R350 Frequency of micturition: Secondary | ICD-10-CM

## 2013-09-06 LAB — URINALYSIS, ROUTINE W REFLEX MICROSCOPIC
Bilirubin Urine: NEGATIVE
Glucose, UA: NEGATIVE mg/dL
Ketones, ur: NEGATIVE mg/dL
NITRITE: NEGATIVE
Protein, ur: NEGATIVE mg/dL
SPECIFIC GRAVITY, URINE: 1.026 (ref 1.005–1.030)
UROBILINOGEN UA: 0.2 mg/dL (ref 0.0–1.0)
pH: 7 (ref 5.0–8.0)

## 2013-09-06 LAB — URINALYSIS, MICROSCOPIC ONLY
BACTERIA UA: NONE SEEN
Casts: NONE SEEN
Crystals: NONE SEEN
WBC, UA: >50 WBC/hpf — AB (ref <3)

## 2013-09-07 LAB — URINE CULTURE

## 2013-09-08 ENCOUNTER — Telehealth: Payer: Self-pay | Admitting: *Deleted

## 2013-09-08 ENCOUNTER — Other Ambulatory Visit: Payer: Self-pay | Admitting: *Deleted

## 2013-09-08 DIAGNOSIS — N390 Urinary tract infection, site not specified: Secondary | ICD-10-CM

## 2013-09-08 MED ORDER — LEVOFLOXACIN 500 MG PO TABS: 500.0000 mg | ORAL_TABLET | Freq: Every day | ORAL | Status: DC

## 2013-09-08 NOTE — Telephone Encounter (Signed)
Message copied by Georgena Spurling on Thu Sep 08, 2013 10:45 AM ------      Message from: HATCHER, JEFFREY C      Created: Wed Sep 07, 2013  9:22 AM       Can you call in 3 days of levaquin for ms Tyer's UTI?            Thanks            ----- Message -----         From: [facility]         Sent: 09/06/2013  12:51 AM           To: Campbell Riches, MD                   ------

## 2013-09-08 NOTE — Telephone Encounter (Signed)
Per Dr. Johnnye Sima, Levaquin 500 mg daily for 3 days called to [facility]. Patient notified. Jacqueline Dawson

## 2013-09-20 ENCOUNTER — Ambulatory Visit: Payer: 59

## 2013-09-28 ENCOUNTER — Ambulatory Visit (INDEPENDENT_AMBULATORY_CARE_PROVIDER_SITE_OTHER): Payer: 59 | Admitting: Infectious Diseases

## 2013-09-28 ENCOUNTER — Encounter: Payer: Self-pay | Admitting: Infectious Diseases

## 2013-09-28 VITALS — BP 128/91 | HR 102 | Temp 98.3°F | Ht 66.0 in | Wt 118.0 lb

## 2013-09-28 DIAGNOSIS — B20 Human immunodeficiency virus [HIV] disease: Secondary | ICD-10-CM

## 2013-09-28 DIAGNOSIS — Z23 Encounter for immunization: Secondary | ICD-10-CM

## 2013-09-28 DIAGNOSIS — N898 Other specified noninflammatory disorders of vagina: Secondary | ICD-10-CM

## 2013-09-28 MED ORDER — ABACAVIR-DOLUTEGRAVIR-LAMIVUD 600-50-300 MG PO TABS: 1.0000 | ORAL_TABLET | Freq: Every day | ORAL | Status: DC

## 2013-09-28 MED ORDER — FLUCONAZOLE 100 MG PO TABS: 100.0000 mg | ORAL_TABLET | Freq: Every day | ORAL | Status: DC

## 2013-09-28 NOTE — Assessment & Plan Note (Signed)
Will change her to Triumeq to see if we can get her to undetectable. She needs PAP this year.  She is not sexually active.  She gets flu shot today.  Her PNVX is uptodate.  She had hep B series but (-) S Ab on 05-2013. Will restart.  rtc 3-4 months

## 2013-09-28 NOTE — Addendum Note (Signed)
Addended by: Landis Gandy on: 09/28/2013 04:03 PM   Modules accepted: Orders

## 2013-09-28 NOTE — Progress Notes (Signed)
   Subjective:    Patient ID: Jacqueline Dawson, female    DOB: 12/01/1975, 38 y.o.   MRN: [id number]  HPI 38 yo F with hx of HIV+ and previous abn PAP/LEEP. She had normal PAP 01-2013.  Previously was changed from [place] to [place]. Stopped complera recently due to persistent blips. HLA (-) 07-2013.  As well, has had persistent UTI sx (had levaquin for 3 days end of July after UCx 50k GBS). Having vaginal d/c. No burning. No flank pain. Started after she used a different body wash, taking a lot of baths.   HIV 1 RNA Quant (copies/mL)  Date Value  05/11/2013 112*  11/10/2012 72*  05/07/2012 <20      CD4 T Cell Abs (/uL)  Date Value  05/11/2013 420   11/10/2012 340*  05/07/2012 480    Review of Systems  Constitutional: Negative for fever, chills, appetite change and unexpected weight change.  Gastrointestinal: Negative for diarrhea and constipation.  Genitourinary: Positive for vaginal discharge. Negative for dysuria, flank pain and difficulty urinating.       Objective:   Physical Exam  Constitutional: She appears well-developed and well-nourished.  HENT:  Mouth/Throat: No oropharyngeal exudate.  Eyes: EOM are normal. Pupils are equal, round, and reactive to light.  Neck: Neck supple.  Cardiovascular: Normal rate, regular rhythm and normal heart sounds.   Pulmonary/Chest: Effort normal and breath sounds normal.  Abdominal: Soft. Bowel sounds are normal. She exhibits no distension. There is no tenderness.  Genitourinary:  She defers.   Lymphadenopathy:    She has no cervical adenopathy.          Assessment & Plan:

## 2013-09-28 NOTE — Assessment & Plan Note (Signed)
Her hx (and her daughters) of sx after bubble bath make me question if this is yeast infection or topical/contact dermatitis.  Will give her 1 week of diflucan, she will call back afterwards to report if this helps.

## 2013-10-04 ENCOUNTER — Ambulatory Visit: Payer: 59

## 2013-11-04 ENCOUNTER — Other Ambulatory Visit: Payer: Self-pay | Admitting: *Deleted

## 2013-11-04 DIAGNOSIS — F3289 Other specified depressive episodes: Secondary | ICD-10-CM

## 2013-11-04 DIAGNOSIS — F329 Major depressive disorder, single episode, unspecified: Secondary | ICD-10-CM

## 2013-11-04 MED ORDER — SERTRALINE HCL 25 MG PO TABS: 25.0000 mg | ORAL_TABLET | Freq: Every day | ORAL | Status: DC

## 2013-11-08 ENCOUNTER — Ambulatory Visit: Payer: 59

## 2013-11-21 ENCOUNTER — Ambulatory Visit: Payer: 59

## 2013-11-25 ENCOUNTER — Ambulatory Visit (INDEPENDENT_AMBULATORY_CARE_PROVIDER_SITE_OTHER): Payer: 59 | Admitting: *Deleted

## 2013-11-25 DIAGNOSIS — Z113 Encounter for screening for infections with a predominantly sexual mode of transmission: Secondary | ICD-10-CM

## 2013-11-25 DIAGNOSIS — Z124 Encounter for screening for malignant neoplasm of cervix: Secondary | ICD-10-CM

## 2013-11-25 NOTE — Patient Instructions (Signed)
Your PAP smear results will be ready in about a week.  I will mail them to you.  The testing for infection should be ready by Monday.  I will call you.  Thank you for coming to the Center for your care.  Langley Gauss, RN

## 2013-11-25 NOTE — Progress Notes (Signed)
  Subjective:     Jacqueline Dawson is a 39 y.o. woman who comes in today for a  pap smear only.  Previous abnormal Pap smear no. Contraception: condoms,  White, creamy, odiferous vaginal discharge x 1 week per pt.  Objective:  LMP:  11/12/13  Pelvic Exam:  Pap smear obtained.  Yellow/green vaginal discharge.  Sample taken for evaluation  Assessment:    Screening pap smear.   Plan:    Follow up in one year, or as indicated by Pap results.  Pt given educational materials re: HIV and women, self-esteem, BSE, nutrition and diet management, PAP smears and partner safety. Pt given condoms.

## 2013-11-28 ENCOUNTER — Other Ambulatory Visit: Payer: Self-pay | Admitting: *Deleted

## 2013-11-28 ENCOUNTER — Telehealth: Payer: Self-pay | Admitting: *Deleted

## 2013-11-28 LAB — CYTOLOGY - PAP

## 2013-11-28 MED ORDER — METRONIDAZOLE 500 MG PO TABS: 2000.0000 mg | ORAL_TABLET | Freq: Once | ORAL | Status: DC

## 2013-11-28 NOTE — Telephone Encounter (Signed)
Patient notified per Dr. Johnnye Sima that she was positive for trich and an Rx for flagyl was sent to [place]. See lab results

## 2013-11-29 ENCOUNTER — Encounter: Payer: Self-pay | Admitting: *Deleted

## 2013-11-29 LAB — CERVICOVAGINAL ANCILLARY ONLY: Candida vaginitis: NEGATIVE

## 2013-12-13 ENCOUNTER — Ambulatory Visit: Payer: 59

## 2013-12-15 ENCOUNTER — Other Ambulatory Visit: Payer: 59

## 2013-12-27 ENCOUNTER — Other Ambulatory Visit: Payer: 59

## 2013-12-29 ENCOUNTER — Ambulatory Visit: Payer: 59 | Admitting: Infectious Diseases

## 2014-01-09 ENCOUNTER — Other Ambulatory Visit (INDEPENDENT_AMBULATORY_CARE_PROVIDER_SITE_OTHER): Payer: 59

## 2014-01-09 ENCOUNTER — Ambulatory Visit: Payer: 59 | Admitting: Infectious Diseases

## 2014-01-09 DIAGNOSIS — B20 Human immunodeficiency virus [HIV] disease: Secondary | ICD-10-CM

## 2014-01-10 LAB — T-HELPER CELL (CD4) - (RCID CLINIC ONLY)
CD4 % Helper T Cell: 20 % — ABNORMAL LOW (ref 33–55)
CD4 T Cell Abs: 280 /uL — ABNORMAL LOW (ref 400–2700)

## 2014-01-10 LAB — HIV-1 RNA QUANT-NO REFLEX-BLD
HIV 1 RNA Quant: 71 copies/mL — ABNORMAL HIGH (ref <20)
HIV-1 RNA Quant, Log: 1.85 {Log} — ABNORMAL HIGH (ref <1.30)

## 2014-01-31 ENCOUNTER — Ambulatory Visit (INDEPENDENT_AMBULATORY_CARE_PROVIDER_SITE_OTHER): Payer: 59 | Admitting: Infectious Diseases

## 2014-01-31 ENCOUNTER — Encounter: Payer: Self-pay | Admitting: Infectious Diseases

## 2014-01-31 ENCOUNTER — Other Ambulatory Visit: Payer: Self-pay | Admitting: *Deleted

## 2014-01-31 VITALS — BP 139/90 | HR 103 | Temp 98.4°F | Wt 121.0 lb

## 2014-01-31 DIAGNOSIS — Z113 Encounter for screening for infections with a predominantly sexual mode of transmission: Secondary | ICD-10-CM

## 2014-01-31 DIAGNOSIS — Z79899 Other long term (current) drug therapy: Secondary | ICD-10-CM

## 2014-01-31 DIAGNOSIS — G4700 Insomnia, unspecified: Secondary | ICD-10-CM

## 2014-01-31 DIAGNOSIS — B20 Human immunodeficiency virus [HIV] disease: Secondary | ICD-10-CM

## 2014-01-31 DIAGNOSIS — R87613 High grade squamous intraepithelial lesion on cytologic smear of cervix (HGSIL): Secondary | ICD-10-CM

## 2014-01-31 LAB — CBC
HEMATOCRIT: 38.5 % (ref 36.0–46.0)
HEMOGLOBIN: 12.9 g/dL (ref 12.0–15.0)
MCH: 28.4 pg (ref 26.0–34.0)
MCHC: 33.5 g/dL (ref 30.0–36.0)
MCV: 84.6 fL (ref 78.0–100.0)
MPV: 10.3 fL (ref 9.4–12.4)
Platelets: 253 10*3/uL (ref 150–400)
RBC: 4.55 MIL/uL (ref 3.87–5.11)
RDW: 13 % (ref 11.5–15.5)
WBC: 5.6 10*3/uL (ref 4.0–10.5)

## 2014-01-31 MED ORDER — ESZOPICLONE 1 MG PO TABS: 1.0000 mg | ORAL_TABLET | Freq: Every evening | ORAL | Status: DC | PRN

## 2014-01-31 NOTE — Assessment & Plan Note (Signed)
Last pap normal. Will continue to watch.

## 2014-01-31 NOTE — Progress Notes (Signed)
   Subjective:    Patient ID: Jacqueline Dawson, female    DOB: 12-21-1975, 38 y.o.   MRN: [id number]  HPI 38 yo F with hx of HIV+ and previous abn PAP/LEEP. She had normal PAP 01-2013.  Previously was changed from [place] to [place]. Stopped complera recently due to persistent blips. HLA (-) 07-2013. Was then chagned to [place] 09-2013.   Doing well. Had extra menses in last month. has felt tired but no dizziness. No problems with new ART.  Got flu and PAP October 2015 (trich).   HIV 1 RNA QUANT (copies/mL)  Date Value  01/09/2014 71*  05/11/2013 112*  11/10/2012 72*   CD4 T CELL ABS (/uL)  Date Value  01/09/2014 280*  05/11/2013 420  11/10/2012 340*   Has gained wt.  BM nowe qod vs prev qam.  Review of Systems  Constitutional: Negative for appetite change and unexpected weight change.  Gastrointestinal: Negative for diarrhea and constipation.  Genitourinary: Positive for menstrual problem. Negative for difficulty urinating.  Neurological: Positive for light-headedness.  Psychiatric/Behavioral: Positive for sleep disturbance.   Has tried benadryl, camomile tea, glass of wine, and still has sleep problems.     Objective:   Physical Exam  Constitutional: She appears well-developed and well-nourished.  HENT:  Mouth/Throat: No oropharyngeal exudate.  Eyes: EOM are normal. Pupils are equal, round, and reactive to light.  Neck: Neck supple.  Cardiovascular: Normal rate, regular rhythm and normal heart sounds.   Pulmonary/Chest: Effort normal and breath sounds normal.  Abdominal: Soft. Bowel sounds are normal. She exhibits no distension. There is no tenderness.  Lymphadenopathy:    She has no cervical adenopathy.          Assessment & Plan:

## 2014-01-31 NOTE — Assessment & Plan Note (Signed)
Will give her trial of lunesta. If this does not work will try restoril.  Sleep hygeine.

## 2014-01-31 NOTE — Assessment & Plan Note (Signed)
Encouraged her to be more adherent. Offered/provided condoms. Has gotten flu shot. Will see her back in 6 months.  Will check CBC today to f/u tiredness and extra menses.

## 2014-02-21 ENCOUNTER — Telehealth: Payer: Self-pay | Admitting: Licensed Clinical Social Worker

## 2014-02-21 ENCOUNTER — Other Ambulatory Visit: Payer: Self-pay | Admitting: Licensed Clinical Social Worker

## 2014-02-21 DIAGNOSIS — G4700 Insomnia, unspecified: Secondary | ICD-10-CM

## 2014-02-21 MED ORDER — TEMAZEPAM 15 MG PO CAPS: 15.0000 mg | ORAL_CAPSULE | Freq: Every evening | ORAL | Status: DC | PRN

## 2014-02-21 NOTE — Telephone Encounter (Signed)
Patient called stating that Jacqueline Dawson is not working and she still is unable to sleep. Per Dr. Algis Downs note if Jacqueline Dawson did not work then try restoril. Per pharmacist [facility] start at 15 mg.

## 2014-06-13 ENCOUNTER — Other Ambulatory Visit: Payer: Self-pay | Admitting: *Deleted

## 2014-06-13 DIAGNOSIS — G4700 Insomnia, unspecified: Secondary | ICD-10-CM

## 2014-06-13 MED ORDER — TEMAZEPAM 15 MG PO CAPS: 15.0000 mg | ORAL_CAPSULE | Freq: Every evening | ORAL | Status: DC | PRN

## 2014-07-06 ENCOUNTER — Telehealth: Payer: Self-pay | Admitting: *Deleted

## 2014-07-06 NOTE — Telephone Encounter (Signed)
Patient used her co-pay card at a [employer] for Triumeq, it was depleted of funds in 2 months.  She did not realize this until she transferred her medication to [facility] and was told that her copay card was empty.  She was notified that the copay paid only for the medication, and that [facility] did not charge her insurance for the  Medication -- which also did not fulfil her deductible responsibility.   Patient has a $3500 deductible to reach due to this error/miscommunication.  RN spoke with Arlyss Queen, [facility]. Will contact [employer] rep Marcie Bal at [phone] and [place] at [phone] for more assistance.  Will need patient's old copay card ID number.  In the mean time, patient can pick up a bottle of Triumeq from [facility]. Landis Gandy, RN

## 2014-07-12 ENCOUNTER — Other Ambulatory Visit: Payer: 59

## 2014-07-12 DIAGNOSIS — Z79899 Other long term (current) drug therapy: Secondary | ICD-10-CM

## 2014-07-12 DIAGNOSIS — B20 Human immunodeficiency virus [HIV] disease: Secondary | ICD-10-CM

## 2014-07-12 DIAGNOSIS — Z113 Encounter for screening for infections with a predominantly sexual mode of transmission: Secondary | ICD-10-CM

## 2014-07-12 LAB — COMPREHENSIVE METABOLIC PANEL
ALK PHOS: 37 U/L — AB (ref 39–117)
ALT: 11 U/L (ref 0–35)
AST: 18 U/L (ref 0–37)
Albumin: 3.9 g/dL (ref 3.5–5.2)
BUN: 9 mg/dL (ref 6–23)
CALCIUM: 8.9 mg/dL (ref 8.4–10.5)
CO2: 24 mEq/L (ref 19–32)
Chloride: 104 mEq/L (ref 96–112)
Creat: 0.76 mg/dL (ref 0.50–1.10)
Glucose, Bld: 75 mg/dL (ref 70–99)
Potassium: 4.1 mEq/L (ref 3.5–5.3)
SODIUM: 137 meq/L (ref 135–145)
Total Bilirubin: 0.2 mg/dL (ref 0.2–1.2)
Total Protein: 6.7 g/dL (ref 6.0–8.3)

## 2014-07-12 LAB — CBC
HCT: 37.4 % (ref 36.0–46.0)
Hemoglobin: 12.5 g/dL (ref 12.0–15.0)
MCH: 28.3 pg (ref 26.0–34.0)
MCHC: 33.4 g/dL (ref 30.0–36.0)
MCV: 84.8 fL (ref 78.0–100.0)
MPV: 9.7 fL (ref 8.6–12.4)
Platelets: 251 10*3/uL (ref 150–400)
RBC: 4.41 MIL/uL (ref 3.87–5.11)
RDW: 13.5 % (ref 11.5–15.5)
WBC: 7.5 10*3/uL (ref 4.0–10.5)

## 2014-07-12 LAB — LIPID PANEL
Cholesterol: 139 mg/dL (ref 0–200)
HDL: 50 mg/dL (ref >=46)
LDL Cholesterol: 65 mg/dL (ref 0–99)
Total CHOL/HDL Ratio: 2.8 Ratio
Triglycerides: 122 mg/dL (ref <150)
VLDL: 24 mg/dL (ref 0–40)

## 2014-07-13 LAB — RPR

## 2014-07-14 LAB — HIV-1 RNA QUANT-NO REFLEX-BLD
HIV 1 RNA Quant: <20 copies/mL (ref <20)
HIV-1 RNA Quant, Log: <1.3 {Log} (ref <1.30)

## 2014-07-14 LAB — T-HELPER CELL (CD4) - (RCID CLINIC ONLY)
CD4 % Helper T Cell: 22 % — ABNORMAL LOW (ref 33–55)
CD4 T Cell Abs: 520 /uL (ref 400–2700)

## 2014-07-26 ENCOUNTER — Encounter: Payer: Self-pay | Admitting: Infectious Diseases

## 2014-07-26 ENCOUNTER — Ambulatory Visit (INDEPENDENT_AMBULATORY_CARE_PROVIDER_SITE_OTHER): Payer: 59 | Admitting: Infectious Diseases

## 2014-07-26 VITALS — BP 152/98 | HR 79 | Temp 98.2°F | Wt 120.0 lb

## 2014-07-26 DIAGNOSIS — Z23 Encounter for immunization: Secondary | ICD-10-CM | POA: Diagnosis not present

## 2014-07-26 DIAGNOSIS — Z113 Encounter for screening for infections with a predominantly sexual mode of transmission: Secondary | ICD-10-CM | POA: Diagnosis not present

## 2014-07-26 DIAGNOSIS — Z79899 Other long term (current) drug therapy: Secondary | ICD-10-CM

## 2014-07-26 DIAGNOSIS — G4700 Insomnia, unspecified: Secondary | ICD-10-CM | POA: Diagnosis not present

## 2014-07-26 DIAGNOSIS — B20 Human immunodeficiency virus [HIV] disease: Secondary | ICD-10-CM | POA: Diagnosis not present

## 2014-07-26 DIAGNOSIS — R87613 High grade squamous intraepithelial lesion on cytologic smear of cervix (HGSIL): Secondary | ICD-10-CM

## 2014-07-26 MED ORDER — TEMAZEPAM 15 MG PO CAPS: 15.0000 mg | ORAL_CAPSULE | Freq: Every evening | ORAL | Status: DC | PRN

## 2014-07-26 MED ORDER — ABACAVIR-DOLUTEGRAVIR-LAMIVUD 600-50-300 MG PO TABS: 1.0000 | ORAL_TABLET | Freq: Every day | ORAL | Status: DC

## 2014-07-26 MED ORDER — CYANOCOBALAMIN 1000 MCG/ML IJ SOLN
1000.0000 ug | Freq: Once | INTRAMUSCULAR | Status: AC
  Administered 2014-07-26: 1000 ug via INTRAMUSCULAR

## 2014-07-26 NOTE — Assessment & Plan Note (Signed)
She is doing very well on triumeq.  Offered/refused condoms.  Gets last of second round of hep b today.  rtc in 6 months.

## 2014-07-26 NOTE — Addendum Note (Signed)
Addended by: Myrtis Hopping A on: 07/26/2014 04:02 PM   Modules accepted: Orders

## 2014-07-26 NOTE — Assessment & Plan Note (Signed)
Will refill her sleep rx as needed.  Tried to get her to do relaxation exercises.

## 2014-07-26 NOTE — Assessment & Plan Note (Signed)
Will have repeat in October. Last was nl.

## 2014-07-26 NOTE — Progress Notes (Signed)
   Subjective:    Patient ID: Jacqueline Dawson, female    DOB: April 01, 1975, 39 y.o.   MRN: [id number]  HPI 39 yo F with hx of HIV+ and previous abn PAP/LEEP. She had normal PAP 01-2013.  Previously was changed from [place] to [place]. Stopped complera recently due to persistent blips. HLA (-) 07-2013. Was then chagned to triumeq 09-2013.   HIV 1 RNA QUANT (copies/mL)  Date Value  07/12/2014 <20  01/09/2014 71*  05/11/2013 112*   CD4 T CELL ABS (/uL)  Date Value  07/12/2014 520  01/09/2014 280*  05/11/2013 420   Today has elevated BP- she attributes this her 20yo son at home.  Doing well with triumeq except for the size.  Last PAP was October- nl.  Sleeping well with sleeping rx. Didn't like zoloft, made her "void of emotions"  Review of Systems  Constitutional: Negative for appetite change and unexpected weight change.  Respiratory: Negative for shortness of breath.   Cardiovascular: Negative for chest pain.  Gastrointestinal: Positive for constipation. Negative for diarrhea.  Genitourinary: Negative for difficulty urinating and menstrual problem.  Neurological: Negative for headaches.   Not drinking enough water, not watching diet, not exercising. Wearing seat belt.     Objective:   Physical Exam  Constitutional: She appears well-developed and well-nourished.  HENT:  Mouth/Throat: No oropharyngeal exudate.  Eyes: EOM are normal. Pupils are equal, round, and reactive to light.  Neck: Neck supple.  Cardiovascular: Normal rate, regular rhythm and normal heart sounds.   Pulmonary/Chest: Effort normal and breath sounds normal.  Abdominal: Soft. Bowel sounds are normal. There is no tenderness.  Lymphadenopathy:    She has no cervical adenopathy.  Psychiatric: She has a normal mood and affect.       Assessment & Plan:

## 2014-07-26 NOTE — Addendum Note (Signed)
Addended by: Myrtis Hopping A on: 07/26/2014 04:18 PM   Modules accepted: Orders, Medications

## 2014-08-04 ENCOUNTER — Other Ambulatory Visit: Payer: Self-pay | Admitting: Licensed Clinical Social Worker

## 2014-08-04 DIAGNOSIS — G4700 Insomnia, unspecified: Secondary | ICD-10-CM

## 2014-08-04 MED ORDER — TEMAZEPAM 15 MG PO CAPS: 15.0000 mg | ORAL_CAPSULE | Freq: Every evening | ORAL | Status: DC | PRN

## 2014-11-17 ENCOUNTER — Other Ambulatory Visit: Payer: Self-pay | Admitting: Infectious Diseases

## 2014-12-01 ENCOUNTER — Encounter: Payer: Self-pay | Admitting: *Deleted

## 2014-12-29 ENCOUNTER — Ambulatory Visit (INDEPENDENT_AMBULATORY_CARE_PROVIDER_SITE_OTHER): Payer: 59 | Admitting: *Deleted

## 2014-12-29 DIAGNOSIS — Z124 Encounter for screening for malignant neoplasm of cervix: Secondary | ICD-10-CM | POA: Diagnosis not present

## 2014-12-29 DIAGNOSIS — Z1231 Encounter for screening mammogram for malignant neoplasm of breast: Secondary | ICD-10-CM | POA: Diagnosis not present

## 2014-12-29 DIAGNOSIS — Z113 Encounter for screening for infections with a predominantly sexual mode of transmission: Secondary | ICD-10-CM | POA: Diagnosis not present

## 2014-12-29 NOTE — Progress Notes (Signed)
  Subjective:     Jacqueline Dawson is a 39 y.o. woman who comes in today for a  pap smear only.  Previous abnormal Pap smears: no. Contraception: condoms.  Pt given a sample of lubricant to try with condoms.  Pt has noticed that her menses are becoming "heavier" over the past few months, samll clots the first day with cramping last 4-5 days.  Objective:  LMP 12/20/14.   Pelvic Exam:  Pap smear obtained.   Copious, thick, white vaginal discharge observed during pap smear.  Assessment:    Screening pap smear.   Plan:    Follow up in one year, or as indicated by Pap results.  Pt given educational materials re: HIV and women, self-esteem, BSE, nutrition and diet management, PAP smears and partner safety. Pt given condoms

## 2014-12-29 NOTE — Patient Instructions (Signed)
Your results will be ready in about a week.  You will be contacted if there is treatment for infection.  Thank you for coming to the Center for your care.  Happy Holidays!!   Langley Gauss, RN

## 2015-01-01 LAB — CERVICOVAGINAL ANCILLARY ONLY
CHLAMYDIA, DNA PROBE: NEGATIVE
Candida vaginitis: NEGATIVE
NEISSERIA GONORRHEA: NEGATIVE
Trichomonas: NEGATIVE

## 2015-01-01 LAB — CYTOLOGY - PAP

## 2015-01-02 ENCOUNTER — Telehealth: Payer: Self-pay | Admitting: *Deleted

## 2015-01-02 DIAGNOSIS — N760 Acute vaginitis: Principal | ICD-10-CM

## 2015-01-02 DIAGNOSIS — B9689 Other specified bacterial agents as the cause of diseases classified elsewhere: Secondary | ICD-10-CM

## 2015-01-02 MED ORDER — METRONIDAZOLE 500 MG PO TABS: 500.0000 mg | ORAL_TABLET | Freq: Two times a day (BID) | ORAL | Status: DC

## 2015-01-02 NOTE — Telephone Encounter (Signed)
-----   Message from Campbell Riches, MD sent at 01/02/2015  3:12 PM EST ----- Needs flagyl 500mg  bid for 7 days.  thanks

## 2015-01-02 NOTE — Telephone Encounter (Signed)
Left generic message for patient informing that a medication was called to [facility] on [address] due to recent lab results.  Asked her to contact us back with questions. Sent RX and patient MyChart message. Landis Gandy, RN

## 2015-01-03 ENCOUNTER — Telehealth: Payer: Self-pay | Admitting: *Deleted

## 2015-01-03 NOTE — Telephone Encounter (Signed)
Abnormal PAP smear - LSIL.  RN called patient to inform her of the abnormal PAP and to find out who she would like to see concerning this result.  The patient will call back after the holiday weekend to let RN know who to contact.  RN also reviewed the reason for treatment for bacterial vaginal infection.  Reviewed how to take the Flagyl prescription.  Pt verbalized understanding.

## 2015-01-03 NOTE — Telephone Encounter (Signed)
Patient called and wanted her PAP results faxed to her OBGYN Dr Mancel Bale as she has an appt 01/08/15. Faxed report to Dr Mancel Bale at [place]

## 2015-01-08 ENCOUNTER — Other Ambulatory Visit: Payer: 59

## 2015-01-08 ENCOUNTER — Other Ambulatory Visit: Payer: Self-pay | Admitting: Obstetrics and Gynecology

## 2015-01-08 DIAGNOSIS — Z79899 Other long term (current) drug therapy: Secondary | ICD-10-CM

## 2015-01-08 DIAGNOSIS — Z113 Encounter for screening for infections with a predominantly sexual mode of transmission: Secondary | ICD-10-CM

## 2015-01-08 DIAGNOSIS — B20 Human immunodeficiency virus [HIV] disease: Secondary | ICD-10-CM

## 2015-01-08 LAB — LIPID PANEL
Cholesterol: 143 mg/dL (ref 125–200)
HDL: 75 mg/dL (ref >=46)
LDL CALC: 53 mg/dL (ref <130)
TRIGLYCERIDES: 76 mg/dL (ref <150)
Total CHOL/HDL Ratio: 1.9 Ratio (ref <=5.0)
VLDL: 15 mg/dL (ref <30)

## 2015-01-08 LAB — COMPREHENSIVE METABOLIC PANEL
ALBUMIN: 4.1 g/dL (ref 3.6–5.1)
ALT: 14 U/L (ref 6–29)
AST: 18 U/L (ref 10–30)
Alkaline Phosphatase: 31 U/L — ABNORMAL LOW (ref 33–115)
BILIRUBIN TOTAL: 0.3 mg/dL (ref 0.2–1.2)
BUN: 12 mg/dL (ref 7–25)
CO2: 27 mmol/L (ref 20–31)
CREATININE: 0.92 mg/dL (ref 0.50–1.10)
Calcium: 9.4 mg/dL (ref 8.6–10.2)
Chloride: 103 mmol/L (ref 98–110)
Glucose, Bld: 90 mg/dL (ref 65–99)
Potassium: 4.1 mmol/L (ref 3.5–5.3)
SODIUM: 138 mmol/L (ref 135–146)
TOTAL PROTEIN: 6.9 g/dL (ref 6.1–8.1)

## 2015-01-08 LAB — CBC
HCT: 37.8 % (ref 36.0–46.0)
HEMOGLOBIN: 12.7 g/dL (ref 12.0–15.0)
MCH: 29 pg (ref 26.0–34.0)
MCHC: 33.6 g/dL (ref 30.0–36.0)
MCV: 86.3 fL (ref 78.0–100.0)
MPV: 9.9 fL (ref 8.6–12.4)
Platelets: 254 10*3/uL (ref 150–400)
RBC: 4.38 MIL/uL (ref 3.87–5.11)
RDW: 12.9 % (ref 11.5–15.5)
WBC: 5.9 10*3/uL (ref 4.0–10.5)

## 2015-01-09 LAB — HIV-1 RNA QUANT-NO REFLEX-BLD: HIV 1 RNA Quant: <20 copies/mL (ref <20)

## 2015-01-09 LAB — URINE CYTOLOGY ANCILLARY ONLY
CHLAMYDIA, DNA PROBE: NEGATIVE
NEISSERIA GONORRHEA: NEGATIVE

## 2015-01-09 LAB — T-HELPER CELL (CD4) - (RCID CLINIC ONLY)
CD4 T CELL ABS: 370 /uL — AB (ref 400–2700)
CD4 T CELL HELPER: 25 % — AB (ref 33–55)

## 2015-01-09 LAB — RPR

## 2015-01-24 ENCOUNTER — Ambulatory Visit (INDEPENDENT_AMBULATORY_CARE_PROVIDER_SITE_OTHER): Payer: 59 | Admitting: Infectious Diseases

## 2015-01-24 ENCOUNTER — Encounter: Payer: Self-pay | Admitting: Infectious Diseases

## 2015-01-24 ENCOUNTER — Ambulatory Visit: Payer: 59 | Admitting: Infectious Diseases

## 2015-01-24 VITALS — BP 124/83 | HR 92 | Temp 97.8°F | Ht 66.0 in | Wt 118.2 lb

## 2015-01-24 DIAGNOSIS — Z79899 Other long term (current) drug therapy: Secondary | ICD-10-CM

## 2015-01-24 DIAGNOSIS — B20 Human immunodeficiency virus [HIV] disease: Secondary | ICD-10-CM

## 2015-01-24 DIAGNOSIS — R87613 High grade squamous intraepithelial lesion on cytologic smear of cervix (HGSIL): Secondary | ICD-10-CM | POA: Diagnosis not present

## 2015-01-24 DIAGNOSIS — G4700 Insomnia, unspecified: Secondary | ICD-10-CM

## 2015-01-24 DIAGNOSIS — F32A Depression, unspecified: Secondary | ICD-10-CM

## 2015-01-24 DIAGNOSIS — Z113 Encounter for screening for infections with a predominantly sexual mode of transmission: Secondary | ICD-10-CM

## 2015-01-24 DIAGNOSIS — F329 Major depressive disorder, single episode, unspecified: Secondary | ICD-10-CM | POA: Diagnosis not present

## 2015-01-24 DIAGNOSIS — Z23 Encounter for immunization: Secondary | ICD-10-CM

## 2015-01-24 MED ORDER — TEMAZEPAM 15 MG PO CAPS: 15.0000 mg | ORAL_CAPSULE | Freq: Every evening | ORAL | Status: DC | PRN

## 2015-01-24 NOTE — Addendum Note (Signed)
Addended by: Amado Coe on: 01/24/2015 11:17 AM   Modules accepted: Orders

## 2015-01-24 NOTE — Assessment & Plan Note (Signed)
Has GYN f/u

## 2015-01-24 NOTE — Progress Notes (Signed)
   Subjective:    Patient ID: Jacqueline Dawson, female    DOB: 08/03/1975, 39 y.o.   MRN: [id number]  HPI 39 yo F with hx of HIV+ and previous abn PAP/LEEP. She had normal PAP 01-2013.  Previously was changed from [place] to [place]. Stopped complera recently due to persistent blips. HLA (-) 07-2013. Was then chagned to triumeq 09-2013.  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  Has f/u in 4 months.  Has been feeling well, no problems. BP better.  Needs flu shot. Still taking sleeping rx.  Has 6 and 18 yo kids.  No Fhx of breast Ca. Will get mammo through GYN.   HIV 1 RNA QUANT (copies/mL)  Date Value  01/08/2015 <20  07/12/2014 <20  01/09/2014 71*   CD4 T CELL ABS (/uL)  Date Value  01/08/2015 370*  07/12/2014 520  01/09/2014 280*    Review of Systems  Constitutional: Negative for appetite change and unexpected weight change.  Respiratory: Negative for cough.   Gastrointestinal: Negative for constipation and blood in stool.  Genitourinary: Negative for difficulty urinating.       Objective:   Physical Exam  Constitutional: She appears well-developed and well-nourished.  HENT:  Mouth/Throat: No oropharyngeal exudate.  Eyes: EOM are normal. Pupils are equal, round, and reactive to light.  Neck: Neck supple.  Cardiovascular: Normal rate, regular rhythm and normal heart sounds.   Pulmonary/Chest: Effort normal and breath sounds normal.  Abdominal: Soft. Bowel sounds are normal. There is no tenderness. There is no rebound.  Lymphadenopathy:    She has no cervical adenopathy.       Assessment & Plan:   

## 2015-01-24 NOTE — Assessment & Plan Note (Addendum)
Kids are HIV (-).  She is doing well.  Has been given condoms.  Will see her back in 6 months.

## 2015-01-24 NOTE — Assessment & Plan Note (Signed)
Will refill her restoril.  

## 2015-01-24 NOTE — Assessment & Plan Note (Signed)
Offered to restart zoloft, get her back in with counselor. Mostly stressed about her son's college process.

## 2015-02-14 NOTE — Progress Notes (Signed)
OBGYN Dr Mancel Bale, appt 01/08/15.

## 2015-06-04 ENCOUNTER — Other Ambulatory Visit: Payer: Self-pay | Admitting: Obstetrics and Gynecology

## 2015-07-11 ENCOUNTER — Other Ambulatory Visit: Payer: 59

## 2015-07-11 DIAGNOSIS — Z79899 Other long term (current) drug therapy: Secondary | ICD-10-CM

## 2015-07-11 DIAGNOSIS — Z113 Encounter for screening for infections with a predominantly sexual mode of transmission: Secondary | ICD-10-CM

## 2015-07-11 DIAGNOSIS — B20 Human immunodeficiency virus [HIV] disease: Secondary | ICD-10-CM

## 2015-07-11 LAB — LIPID PANEL
CHOLESTEROL: 150 mg/dL (ref 125–200)
HDL: 74 mg/dL (ref >=46)
LDL Cholesterol: 63 mg/dL (ref <130)
TRIGLYCERIDES: 64 mg/dL (ref <150)
Total CHOL/HDL Ratio: 2 Ratio (ref <=5.0)
VLDL: 13 mg/dL (ref <30)

## 2015-07-11 LAB — CBC
HCT: 37.2 % (ref 35.0–45.0)
HEMOGLOBIN: 12.4 g/dL (ref 11.7–15.5)
MCH: 29 pg (ref 27.0–33.0)
MCHC: 33.3 g/dL (ref 32.0–36.0)
MCV: 86.9 fL (ref 80.0–100.0)
MPV: 9.7 fL (ref 7.5–12.5)
PLATELETS: 278 10*3/uL (ref 140–400)
RBC: 4.28 MIL/uL (ref 3.80–5.10)
RDW: 13 % (ref 11.0–15.0)
WBC: 6.5 10*3/uL (ref 3.8–10.8)

## 2015-07-11 LAB — COMPREHENSIVE METABOLIC PANEL
ALBUMIN: 4 g/dL (ref 3.6–5.1)
ALK PHOS: 39 U/L (ref 33–115)
ALT: 9 U/L (ref 6–29)
AST: 14 U/L (ref 10–30)
BILIRUBIN TOTAL: 0.2 mg/dL (ref 0.2–1.2)
BUN: 13 mg/dL (ref 7–25)
CHLORIDE: 106 mmol/L (ref 98–110)
CO2: 24 mmol/L (ref 20–31)
CREATININE: 0.94 mg/dL (ref 0.50–1.10)
Calcium: 9.1 mg/dL (ref 8.6–10.2)
Glucose, Bld: 78 mg/dL (ref 65–99)
Potassium: 4.3 mmol/L (ref 3.5–5.3)
SODIUM: 140 mmol/L (ref 135–146)
TOTAL PROTEIN: 6.6 g/dL (ref 6.1–8.1)

## 2015-07-12 LAB — RPR

## 2015-07-12 LAB — URINE CYTOLOGY ANCILLARY ONLY
CHLAMYDIA, DNA PROBE: NEGATIVE
NEISSERIA GONORRHEA: NEGATIVE

## 2015-07-12 LAB — T-HELPER CELL (CD4) - (RCID CLINIC ONLY)
CD4 T CELL ABS: 370 /uL — AB (ref 400–2700)
CD4 T CELL HELPER: 24 % — AB (ref 33–55)

## 2015-07-12 LAB — HIV-1 RNA QUANT-NO REFLEX-BLD: HIV-1 RNA Quant, Log: <1.3 Log copies/mL (ref <1.30)

## 2015-07-25 ENCOUNTER — Ambulatory Visit (INDEPENDENT_AMBULATORY_CARE_PROVIDER_SITE_OTHER): Payer: 59 | Admitting: Infectious Diseases

## 2015-07-25 ENCOUNTER — Telehealth: Payer: Self-pay | Admitting: *Deleted

## 2015-07-25 ENCOUNTER — Encounter: Payer: Self-pay | Admitting: Infectious Diseases

## 2015-07-25 ENCOUNTER — Other Ambulatory Visit: Payer: Self-pay | Admitting: Infectious Diseases

## 2015-07-25 VITALS — BP 121/81 | HR 82 | Temp 97.7°F | Wt 110.0 lb

## 2015-07-25 DIAGNOSIS — R87613 High grade squamous intraepithelial lesion on cytologic smear of cervix (HGSIL): Secondary | ICD-10-CM

## 2015-07-25 DIAGNOSIS — B20 Human immunodeficiency virus [HIV] disease: Secondary | ICD-10-CM | POA: Diagnosis not present

## 2015-07-25 MED ORDER — TRANEXAMIC ACID 650 MG PO TABS
1300.0000 mg | ORAL_TABLET | Freq: Three times a day (TID) | ORAL | Status: DC
Start: 2015-07-25 — End: 2016-07-30

## 2015-07-25 MED ORDER — DRONABINOL 5 MG PO CAPS: 5.0000 mg | ORAL_CAPSULE | Freq: Every day | ORAL | Status: DC

## 2015-07-25 NOTE — Assessment & Plan Note (Signed)
She has f/u planned this fall.

## 2015-07-25 NOTE — Telephone Encounter (Signed)
Patient wanted Dr. Johnnye Sima to know the name and strength of a medication she is receiving from Dr. Mancel Bale, [place].  RX from Dr. Mancel Bale - Tranexamic Acid 650 MG

## 2015-07-25 NOTE — Progress Notes (Signed)
   Subjective:    Patient ID: Jacqueline Dawson, female    DOB: 1975-12-24, 40 y.o.   MRN: [id number]  HPI 40 yo F with hx of HIV+ and previous abn PAP/LEEP. She had normal PAP 01-2013.  Previously was changed from [place] to [place]. Stopped complera recently due to persistent blips. HLA (-) 07-2013. Was then chagned to triumeq 09-2013.  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  Has 53 and 12 yo kids.  No Fhx of breast Ca. Will get mammo through GYN.   HIV 1 RNA QUANT (copies/mL)  Date Value  07/11/2015 <20  01/08/2015 <20  07/12/2014 <20   CD4 T CELL ABS (/uL)  Date Value  07/11/2015 370*  01/08/2015 370*  07/12/2014 520   Has been doing well. Son going to college.  Would like rx to help her gain weight.  Having heavy periods, has been sent to [facility]. Has fibroids.Was started on "t" pill, 2 tid.  Was started on vitamin D.   Last PAP (d/c?) was April this year, has repeat scheduled in August.  mammo scheduled for Dec.   Review of Systems  Constitutional: Negative for appetite change and unexpected weight change.  Gastrointestinal: Negative for diarrhea and constipation.  Genitourinary: Positive for menstrual problem. Negative for difficulty urinating.       Objective:   Physical Exam  Constitutional: She appears well-developed and well-nourished.  HENT:  Mouth/Throat: No oropharyngeal exudate.  Eyes: EOM are normal. Pupils are equal, round, and reactive to light.  Neck: Neck supple.  Cardiovascular: Normal rate, regular rhythm and normal heart sounds.   Pulmonary/Chest: Effort normal and breath sounds normal.  Abdominal: Soft. Bowel sounds are normal. There is no tenderness. There is no rebound.  Musculoskeletal: She exhibits no edema.  Lymphadenopathy:    She has no cervical adenopathy.          Assessment & Plan:

## 2015-07-25 NOTE — Assessment & Plan Note (Signed)
She is doing very well.  Will start her on marinol for her wt.  Offered/refused condoms.  She is enthusiastic about injectable rx.  Will see her back in 6 months.

## 2015-08-21 ENCOUNTER — Telehealth: Payer: Self-pay | Admitting: *Deleted

## 2015-08-21 NOTE — Telephone Encounter (Addendum)
Patient has started a new job and needs a copy of her immunizations and a letter regarding Marinol affecting a drug screen. Dr. Johnnye Sima has talked to her about this previously. Shot record printed and placed upfront for pick up. Advised her I would send a note to Dr. Johnnye Sima regarding the letter. Would need to be on generic letter head that does not read Infectious Disease. Jacqueline Dawson

## 2015-08-24 ENCOUNTER — Telehealth: Payer: Self-pay | Admitting: *Deleted

## 2015-08-24 ENCOUNTER — Other Ambulatory Visit: Payer: Self-pay | Admitting: *Deleted

## 2015-08-24 MED ORDER — SERTRALINE HCL 25 MG PO TABS: 25.0000 mg | ORAL_TABLET | Freq: Every day | ORAL | Status: DC

## 2015-08-24 NOTE — Telephone Encounter (Signed)
Rx sent and patient notified. Jacqueline Cockerham  

## 2015-08-24 NOTE — Telephone Encounter (Signed)
Please refill zoloft

## 2015-08-24 NOTE — Telephone Encounter (Addendum)
Patient called requesting an Rx for Zoloft. She was previously given this and stopped. She had an Rx at home and started this a month ago and is now about out. She also inquired again about the letter she is requesting. See previous note please. Patient would like Rx sent to [facility] [address] in [place]. Myrtis Hopping

## 2015-09-04 ENCOUNTER — Other Ambulatory Visit: Payer: Self-pay | Admitting: *Deleted

## 2015-09-04 MED ORDER — ABACAVIR-DOLUTEGRAVIR-LAMIVUD 600-50-300 MG PO TABS: 1.0000 | ORAL_TABLET | Freq: Every day | ORAL | 5 refills | Status: DC

## 2015-11-06 ENCOUNTER — Other Ambulatory Visit: Payer: Self-pay | Admitting: Obstetrics and Gynecology

## 2015-11-19 ENCOUNTER — Other Ambulatory Visit: Payer: Self-pay | Admitting: *Deleted

## 2015-11-19 MED ORDER — ABACAVIR-DOLUTEGRAVIR-LAMIVUD 600-50-300 MG PO TABS: 1.0000 | ORAL_TABLET | Freq: Every day | ORAL | 3 refills | Status: DC

## 2015-11-20 ENCOUNTER — Telehealth: Payer: Self-pay | Admitting: *Deleted

## 2015-11-20 NOTE — Telephone Encounter (Signed)
Patient notified and per Dr. Johnnye Sima he will decide on increasing the Zoloft after her appt. She is scheduled for Monday 11/26/15. She was ok with waiting for an increase on meds.

## 2015-11-20 NOTE — Telephone Encounter (Signed)
Pt needs counselor as well as rx thanks

## 2015-11-20 NOTE — Telephone Encounter (Signed)
Patient called requesting an increase in her Rx for Zoloft. It had been working well for her depression previously and she stated she has been taking it for about a year. Asked if she had previously seen a counselor and she has seen Grayland Ormond in the past, but not for some time. She is willing to see him again, but feels the medication was helping for a long time without the additional counseling. Please advise

## 2015-11-26 ENCOUNTER — Ambulatory Visit: Payer: BC Managed Care – PPO

## 2015-11-26 DIAGNOSIS — F4322 Adjustment disorder with anxiety: Secondary | ICD-10-CM

## 2015-11-26 NOTE — BH Specialist Note (Signed)
I met with Jacqueline Dawson today for the first time since 2015 and she reports some increased anxiety and depression in the past few weeks. She was tearful at times during the session and said initially that she was here to see if Dr. Johnnye Sima would increase her Zoloft from 25 mg. After a lengthy discussion about it, she actually would rather work on her issues in therapy and not increase the Zoloft.  She reports that it decreases her libido, even at that low dose and she doesn't like that. She said that anxiety is more of her issue than depression and wondered about Xanax. I gave her some basic information about it, but agreed to discuss this with Dr. Johnnye Sima. We agreed to meet again to begin working on helping her relax and decrease anxiety.  She talked about being a single mom to a 63 year old and also havinig a 49 year old in college at [employer]. We also discussed natural approaches to reducing anxiety including walking, yoga, tai chi, etc. She agreed to look into taking a yoga class in town.  Plan to meet again in one week. [name], LCSW

## 2015-11-30 NOTE — Patient Instructions (Addendum)
Your procedure is scheduled on:  Thursday, Nov. 2, 2017  Enter through the [employer] [facility] at:  12 noon  Pick up the phone at the desk and dial [phone].  Call this number if you have problems the morning of surgery: [phone].  Remember: Do NOT eat food:  After Midnight Wednesday, Nov. 1, 2017  Do NOT drink clear liquids after:  9:30 AM day of surgery  Take these medicines the morning of surgery with a SIP OF WATER:  None  Stop ALL herbal medications at this time   Do NOT wear jewelry (body piercing), metal hair clips/bobby pins, make-up, or nail polish. Do NOT wear lotions, powders, or perfumes.  You may wear deodorant. Do NOT shave for 48 hours prior to surgery. Do NOT bring valuables to the hospital. Contacts, dentures, or bridgework may not be worn into surgery.  Have a responsible adult drive you home and stay with you for 24 hours after your procedure

## 2015-12-03 ENCOUNTER — Encounter (HOSPITAL_COMMUNITY): Payer: Self-pay

## 2015-12-03 ENCOUNTER — Encounter (HOSPITAL_COMMUNITY)
Admission: RE | Admit: 2015-12-03 | Discharge: 2015-12-03 | Disposition: A | Discharge status: Home / Self Care | Payer: BC Managed Care – PPO | Source: Ambulatory Visit | Attending: Obstetrics and Gynecology | Admitting: Obstetrics and Gynecology

## 2015-12-03 DIAGNOSIS — N83202 Unspecified ovarian cyst, left side: Secondary | ICD-10-CM | POA: Diagnosis not present

## 2015-12-03 DIAGNOSIS — Z01812 Encounter for preprocedural laboratory examination: Secondary | ICD-10-CM | POA: Diagnosis present

## 2015-12-03 HISTORY — DX: Asymptomatic human immunodeficiency virus (hiv) infection status: Z21

## 2015-12-03 HISTORY — DX: Anxiety disorder, unspecified: F41.9

## 2015-12-03 LAB — CBC
HCT: 36.4 % (ref 36.0–46.0)
Hemoglobin: 12.2 g/dL (ref 12.0–15.0)
MCH: 28.7 pg (ref 26.0–34.0)
MCHC: 33.5 g/dL (ref 30.0–36.0)
MCV: 85.6 fL (ref 78.0–100.0)
PLATELETS: 237 10*3/uL (ref 150–400)
RBC: 4.25 MIL/uL (ref 3.87–5.11)
RDW: 12.2 % (ref 11.5–15.5)
WBC: 4.4 10*3/uL (ref 4.0–10.5)

## 2015-12-03 LAB — BASIC METABOLIC PANEL
Anion gap: 6 (ref 5–15)
BUN: 14 mg/dL (ref 6–20)
CO2: 27 mmol/L (ref 22–32)
CREATININE: 0.66 mg/dL (ref 0.44–1.00)
Calcium: 9.1 mg/dL (ref 8.9–10.3)
Chloride: 104 mmol/L (ref 101–111)
GFR calc Af Amer: >60 mL/min (ref >60)
Glucose, Bld: 87 mg/dL (ref 65–99)
Potassium: 3.8 mmol/L (ref 3.5–5.1)
SODIUM: 137 mmol/L (ref 135–145)

## 2015-12-05 NOTE — H&P (Signed)
Jacqueline Dawson is a 40 y.o. female P: 2-0-5-2 who  presents for a left ovarian cystectomy because of a complex ovarian cyst, pelvic pain and menorrhagia.  For the past several months the patient has had a menstrual flow lasting 5 days with hourly saturated pad change  and occasionally soiling her clothes.  Her cramping  has been  severe,  rated 10/10 on a 10 point pain scale with a decrease to 6/10 with Aleve #2.  At times her period would begin early and at other times late.  She denies any intermenstrual bleeding, changes in bowel function or problems with urination.  She admits to dyspareunia and lower back pain along with random dull left lower quadrant pain.  She has not identified any triggers to this pain nor any alleviating factors.  A pelvic ultrasound showed: uterus: 8.06 x 4.30 cm; #2 fibroids: 3.26 cm posterior subserosal and 1.08 cm  intramural lower uterine segment; right ovary-2.95 cm and left ovary-3.66 cm with a complex, mostly cystic lesion with calcifications measuring 2.5 cm (hemorrhagic vs dermoid). Given the protracted nature of her symptoms and the findings on ultrasound the patient has decided to proceed with surgical management of her left ovarian cyst.   Past Medical History  OB History: G: 7 P: 2-0-5-2; SVB: 1998 and 2010  GYN History: menarche: 40 YO    LMP: 11/30/2015   Contraception:  Condoms;     History of abnormal PAP smear-2016;    Last PAP smear: 2017-normal  Medical History: HIV Positive and  GERD  Surgical History: 2010 Tubal Sterilization Denies problems with anesthesia or history of blood transfusions  Family History: Hypertension, Asthma, Renal Disease, Colon Cancer and Pancreatic Cancer  Social History: Single and employed by [employer]; Denies tobacco use and occasionally consumes alcohol   Medications:  Anaprox DS 550 mg  1  po pc bid prn-pain Dronabinol 5 mg  daily before lunch Triumeq 600mg -50-300 mg daily Multivitamin daily Vitamin D3  1000 IU  daily Percocet 5/325  every 6 hours prn-pain  Allergies  Allergen Reactions  . Sulfonamide Derivatives Rash    ROS: Denies headache, vision changes, nasal congestion, dysphagia, tinnitus, dizziness, hoarseness, cough,  chest pain, shortness of breath, nausea, vomiting, diarrhea,constipation,  urinary frequency, urgency  dysuria, hematuria, vaginitis symptoms,  swelling of joints,easy bruising,  myalgias, arthralgias, skin rashes, unexplained weight loss and except as is mentioned in the history of present illness, patient's review of systems is otherwise negative.     Physical Exam  Bp: 110/60    P: 68   R: 16   Temperature: 98.7 degrees F orally  Weight: 120 lbs.  Height: 5\' 6"   BMI: 19.4  Neck: supple without masses or thyromegaly Lungs: clear to auscultation Heart: regular rate and rhythm Abdomen: soft, diffusely tender with guarding but no rebound and no organomegaly Pelvic:EGBUS- wnl; vagina-normal rugae; uterus-normal size, cervix without lesions or motion tenderness; adnexae-bilateral  tenderness but no  masses Extremities:  no clubbing, cyanosis or edema   Assesment: Complex Ovarian Cyst           Pelvic Pain           Menorrhagia           Dysmenorrhea   Disposition:  A discussion was held with patient regarding the indication for her procedure(s) along with the risks, which include but are not limited to: reaction to anesthesia, damage to adjacent organs, infection and excessive bleeding.  The patient verbalized understanding of  these risks and has consented to proceed with a Laparoscopic Left Ovarian Cystectomy at [facility] on December 13, 2015.  CSN# [id number]   [place] J. Florene Glen, PA-C  for Dr. Harvie Bridge. Mancel Bale

## 2015-12-06 ENCOUNTER — Ambulatory Visit: Payer: BC Managed Care – PPO

## 2015-12-06 DIAGNOSIS — F4322 Adjustment disorder with anxiety: Secondary | ICD-10-CM

## 2015-12-06 NOTE — BH Specialist Note (Signed)
I met with Jacqueline Dawson today for the second time and planned to enter in the [facility] system [facility] but was unable to connect to the server today. Will do this next session. She talked about having anxiety more than depression this time around. She also talked about a new relationship and how she is trying to give it a chance because she doesn't really see any "red flags" but has said some things to him suggesting she is still afraid of trusting. I encouraged her to trust her intuition because if she has been hurt in the past, she will sense when someone is not right. She agreed.  But I also pointed out that she is protecting herself and that is a good thing. I facilitated a guided relaxation/imagery exercise where she put away stressors in a "container" and afterward she said her anxiety went away. Plan to meet again in one week. [name], LCSW

## 2015-12-12 ENCOUNTER — Ambulatory Visit: Payer: BC Managed Care – PPO

## 2015-12-12 DIAGNOSIS — F4322 Adjustment disorder with anxiety: Secondary | ICD-10-CM

## 2015-12-12 NOTE — BH Specialist Note (Signed)
Jacqueline Dawson was in a good mood today, reporting that things are continuing to go well with her new female friend. They are taking things slow, she said, but she is feeling good about it and feels like it is a healthy relationship so far. I helped her process her emotions about this. She is also having an outpatient surgery tomorrow but is not too worried about it. She says her anxiety has decreased lately.  Plan to meet again in one week. [name], LCSW

## 2015-12-13 ENCOUNTER — Encounter (HOSPITAL_COMMUNITY): Payer: Self-pay

## 2015-12-13 ENCOUNTER — Ambulatory Visit (HOSPITAL_COMMUNITY): Payer: BC Managed Care – PPO | Admitting: Anesthesiology

## 2015-12-13 ENCOUNTER — Ambulatory Visit (HOSPITAL_COMMUNITY)
Admission: RE | Admit: 2015-12-13 | Discharge: 2015-12-13 | Disposition: A | Discharge status: Home / Self Care | Payer: BC Managed Care – PPO | Source: Ambulatory Visit | Attending: Obstetrics and Gynecology | Admitting: Obstetrics and Gynecology

## 2015-12-13 ENCOUNTER — Encounter (HOSPITAL_COMMUNITY)
Admission: RE | Disposition: A | Discharge status: Home / Self Care | Payer: Self-pay | Source: Ambulatory Visit | Attending: Obstetrics and Gynecology

## 2015-12-13 DIAGNOSIS — Z800 Family history of malignant neoplasm of digestive organs: Secondary | ICD-10-CM | POA: Insufficient documentation

## 2015-12-13 DIAGNOSIS — N920 Excessive and frequent menstruation with regular cycle: Secondary | ICD-10-CM | POA: Insufficient documentation

## 2015-12-13 DIAGNOSIS — Z87891 Personal history of nicotine dependence: Secondary | ICD-10-CM | POA: Diagnosis not present

## 2015-12-13 DIAGNOSIS — R102 Pelvic and perineal pain: Secondary | ICD-10-CM | POA: Diagnosis not present

## 2015-12-13 DIAGNOSIS — N946 Dysmenorrhea, unspecified: Secondary | ICD-10-CM | POA: Insufficient documentation

## 2015-12-13 DIAGNOSIS — Z882 Allergy status to sulfonamides status: Secondary | ICD-10-CM | POA: Diagnosis not present

## 2015-12-13 DIAGNOSIS — Z8249 Family history of ischemic heart disease and other diseases of the circulatory system: Secondary | ICD-10-CM | POA: Diagnosis not present

## 2015-12-13 DIAGNOSIS — Z825 Family history of asthma and other chronic lower respiratory diseases: Secondary | ICD-10-CM | POA: Insufficient documentation

## 2015-12-13 DIAGNOSIS — N83202 Unspecified ovarian cyst, left side: Secondary | ICD-10-CM

## 2015-12-13 DIAGNOSIS — K219 Gastro-esophageal reflux disease without esophagitis: Secondary | ICD-10-CM | POA: Diagnosis not present

## 2015-12-13 DIAGNOSIS — D271 Benign neoplasm of left ovary: Secondary | ICD-10-CM | POA: Insufficient documentation

## 2015-12-13 DIAGNOSIS — Z21 Asymptomatic human immunodeficiency virus [HIV] infection status: Secondary | ICD-10-CM | POA: Insufficient documentation

## 2015-12-13 HISTORY — PX: LAPAROSCOPIC OVARIAN CYSTECTOMY: SHX6248

## 2015-12-13 LAB — PREGNANCY, URINE: Preg Test, Ur: NEGATIVE

## 2015-12-13 SURGERY — EXCISION, CYST, OVARY, LAPAROSCOPIC
Anesthesia: General | Site: Abdomen | Laterality: Left

## 2015-12-13 MED ORDER — ONDANSETRON HCL 4 MG/2ML IJ SOLN
INTRAMUSCULAR | Status: DC | PRN
  Administered 2015-12-13: 4 mg via INTRAVENOUS

## 2015-12-13 MED ORDER — MEPERIDINE HCL 25 MG/ML IJ SOLN
INTRAMUSCULAR | Status: AC
  Filled 2015-12-13: qty 1

## 2015-12-13 MED ORDER — CEFAZOLIN SODIUM-DEXTROSE 2-4 GM/100ML-% IV SOLN
2.0000 g | INTRAVENOUS | Status: AC
  Administered 2015-12-13: 2 g via INTRAVENOUS

## 2015-12-13 MED ORDER — LACTATED RINGERS IV SOLN
INTRAVENOUS | Status: DC
  Administered 2015-12-13 (×3): via INTRAVENOUS

## 2015-12-13 MED ORDER — PROPOFOL 10 MG/ML IV BOLUS
INTRAVENOUS | Status: AC
  Filled 2015-12-13: qty 20

## 2015-12-13 MED ORDER — ROCURONIUM BROMIDE 100 MG/10ML IV SOLN
INTRAVENOUS | Status: AC
  Filled 2015-12-13: qty 1

## 2015-12-13 MED ORDER — DEXAMETHASONE SODIUM PHOSPHATE 10 MG/ML IJ SOLN
INTRAMUSCULAR | Status: DC | PRN
  Administered 2015-12-13: 10 mg via INTRAVENOUS

## 2015-12-13 MED ORDER — ONDANSETRON HCL 4 MG/2ML IJ SOLN
INTRAMUSCULAR | Status: AC
  Filled 2015-12-13: qty 2

## 2015-12-13 MED ORDER — SCOPOLAMINE 1 MG/3DAYS TD PT72
1.0000 | MEDICATED_PATCH | Freq: Once | TRANSDERMAL | Status: DC
  Administered 2015-12-13: 1.5 mg via TRANSDERMAL

## 2015-12-13 MED ORDER — NAPROXEN SODIUM 550 MG PO TABS: ORAL_TABLET | ORAL | 1 refills | Status: DC

## 2015-12-13 MED ORDER — OXYCODONE HCL 5 MG PO TABS: 5.0000 mg | ORAL_TABLET | Freq: Once | ORAL | Status: DC | PRN

## 2015-12-13 MED ORDER — FENTANYL CITRATE (PF) 100 MCG/2ML IJ SOLN
INTRAMUSCULAR | Status: DC | PRN
  Administered 2015-12-13: 100 ug via INTRAVENOUS
  Administered 2015-12-13 (×3): 50 ug via INTRAVENOUS

## 2015-12-13 MED ORDER — FENTANYL CITRATE (PF) 100 MCG/2ML IJ SOLN
25.0000 ug | INTRAMUSCULAR | Status: DC | PRN
  Administered 2015-12-13 (×2): 50 ug via INTRAVENOUS

## 2015-12-13 MED ORDER — OXYCODONE HCL 5 MG/5ML PO SOLN: 5.0000 mg | Freq: Once | ORAL | Status: DC | PRN

## 2015-12-13 MED ORDER — SUGAMMADEX SODIUM 200 MG/2ML IV SOLN
INTRAVENOUS | Status: AC
  Filled 2015-12-13: qty 2

## 2015-12-13 MED ORDER — OXYCODONE-ACETAMINOPHEN 5-325 MG PO TABS: 1.0000 | ORAL_TABLET | Freq: Four times a day (QID) | ORAL | 0 refills | Status: DC | PRN

## 2015-12-13 MED ORDER — KETOROLAC TROMETHAMINE 30 MG/ML IJ SOLN: 30.0000 mg | Freq: Once | INTRAMUSCULAR | Status: DC

## 2015-12-13 MED ORDER — FENTANYL CITRATE (PF) 250 MCG/5ML IJ SOLN
INTRAMUSCULAR | Status: AC
  Filled 2015-12-13: qty 5

## 2015-12-13 MED ORDER — ONDANSETRON HCL 4 MG/2ML IJ SOLN: 4.0000 mg | Freq: Once | INTRAMUSCULAR | Status: DC | PRN

## 2015-12-13 MED ORDER — FENTANYL CITRATE (PF) 100 MCG/2ML IJ SOLN
INTRAMUSCULAR | Status: AC
  Administered 2015-12-13: 50 ug via INTRAVENOUS
  Filled 2015-12-13: qty 2

## 2015-12-13 MED ORDER — PROPOFOL 10 MG/ML IV BOLUS
INTRAVENOUS | Status: DC | PRN
  Administered 2015-12-13: 150 mg via INTRAVENOUS

## 2015-12-13 MED ORDER — MIDAZOLAM HCL 2 MG/2ML IJ SOLN
INTRAMUSCULAR | Status: AC
  Filled 2015-12-13: qty 2

## 2015-12-13 MED ORDER — ROCURONIUM BROMIDE 100 MG/10ML IV SOLN
INTRAVENOUS | Status: DC | PRN
  Administered 2015-12-13: 10 mg via INTRAVENOUS
  Administered 2015-12-13: 40 mg via INTRAVENOUS

## 2015-12-13 MED ORDER — DEXAMETHASONE SODIUM PHOSPHATE 10 MG/ML IJ SOLN
INTRAMUSCULAR | Status: AC
  Filled 2015-12-13: qty 1

## 2015-12-13 MED ORDER — MEPERIDINE HCL 25 MG/ML IJ SOLN
6.2500 mg | INTRAMUSCULAR | Status: DC | PRN
  Administered 2015-12-13: 12.5 mg via INTRAVENOUS

## 2015-12-13 MED ORDER — BUPIVACAINE HCL (PF) 0.25 % IJ SOLN
INTRAMUSCULAR | Status: DC | PRN
  Administered 2015-12-13: 20 mL

## 2015-12-13 MED ORDER — SCOPOLAMINE 1 MG/3DAYS TD PT72
MEDICATED_PATCH | TRANSDERMAL | Status: AC
  Administered 2015-12-13: 1.5 mg via TRANSDERMAL
  Filled 2015-12-13: qty 1

## 2015-12-13 MED ORDER — LIDOCAINE HCL (CARDIAC) 20 MG/ML IV SOLN
INTRAVENOUS | Status: DC | PRN
  Administered 2015-12-13: 80 mg via INTRAVENOUS

## 2015-12-13 MED ORDER — MIDAZOLAM HCL 2 MG/2ML IJ SOLN
INTRAMUSCULAR | Status: DC | PRN
  Administered 2015-12-13: 2 mg via INTRAVENOUS

## 2015-12-13 MED ORDER — KETOROLAC TROMETHAMINE 30 MG/ML IJ SOLN
INTRAMUSCULAR | Status: DC | PRN
  Administered 2015-12-13: 30 mg via INTRAVENOUS

## 2015-12-13 MED ORDER — LIDOCAINE HCL (CARDIAC) 20 MG/ML IV SOLN
INTRAVENOUS | Status: AC
  Filled 2015-12-13: qty 5

## 2015-12-13 MED ORDER — SUGAMMADEX SODIUM 200 MG/2ML IV SOLN
INTRAVENOUS | Status: DC | PRN
  Administered 2015-12-13: 200 mg via INTRAVENOUS

## 2015-12-13 MED ORDER — KETOROLAC TROMETHAMINE 30 MG/ML IJ SOLN
INTRAMUSCULAR | Status: AC
  Filled 2015-12-13: qty 1

## 2015-12-13 MED ORDER — BUPIVACAINE HCL (PF) 0.25 % IJ SOLN
INTRAMUSCULAR | Status: AC
  Filled 2015-12-13: qty 30

## 2015-12-13 SURGICAL SUPPLY — 37 items
BAG SPEC RTRVL LRG 6X4 10 (ENDOMECHANICALS) ×1
CABLE HIGH FREQUENCY MONO STRZ (ELECTRODE) ×2 IMPLANT
CLOTH BEACON ORANGE TIMEOUT ST (SAFETY) ×3 IMPLANT
DRSG OPSITE POSTOP 3X4 (GAUZE/BANDAGES/DRESSINGS) ×2 IMPLANT
DURAPREP 26ML APPLICATOR (WOUND CARE) ×3 IMPLANT
FORCEPS CUTTING 33CM 5MM (CUTTING FORCEPS) IMPLANT
GLOVE BIO SURGEON STRL SZ7.5 (GLOVE) ×3 IMPLANT
GLOVE BIOGEL PI IND STRL 7.0 (GLOVE) ×1 IMPLANT
GLOVE BIOGEL PI IND STRL 7.5 (GLOVE) ×1 IMPLANT
GLOVE BIOGEL PI INDICATOR 7.0 (GLOVE) ×2
GLOVE BIOGEL PI INDICATOR 7.5 (GLOVE) ×2
GOWN STRL REUS W/TWL LRG LVL3 (GOWN DISPOSABLE) ×6 IMPLANT
LIQUID BAND (GAUZE/BANDAGES/DRESSINGS) ×3 IMPLANT
NEEDLE INSUFFLATION 120MM (ENDOMECHANICALS) ×2 IMPLANT
NS IRRIG 1000ML POUR BTL (IV SOLUTION) ×3 IMPLANT
PACK LAPAROSCOPY BASIN (CUSTOM PROCEDURE TRAY) ×3 IMPLANT
PACK TRENDGUARD 450 HYBRID PRO (MISCELLANEOUS) IMPLANT
PACK TRENDGUARD 600 HYBRD PROC (MISCELLANEOUS) IMPLANT
POUCH SPECIMEN RETRIEVAL 10MM (ENDOMECHANICALS) ×2 IMPLANT
PROTECTOR NERVE ULNAR (MISCELLANEOUS) ×6 IMPLANT
SCISSORS LAP 5X35 DISP (ENDOMECHANICALS) ×2 IMPLANT
SET IRRIG TUBING LAPAROSCOPIC (IRRIGATION / IRRIGATOR) IMPLANT
SLEEVE XCEL OPT CAN 5 100 (ENDOMECHANICALS) IMPLANT
SOLUTION ELECTROLUBE (MISCELLANEOUS) IMPLANT
SUT MNCRL AB 3-0 PS2 27 (SUTURE) ×3 IMPLANT
SUT VICRYL 0 ENDOLOOP (SUTURE) IMPLANT
SUT VICRYL 0 TIES 12 18 (SUTURE) IMPLANT
SUT VICRYL 0 UR6 27IN ABS (SUTURE) ×3 IMPLANT
SYR 50ML LL SCALE MARK (SYRINGE) ×2 IMPLANT
TOWEL OR 17X24 6PK STRL BLUE (TOWEL DISPOSABLE) ×6 IMPLANT
TRAY FOLEY CATH SILVER 14FR (SET/KITS/TRAYS/PACK) ×3 IMPLANT
TRENDGUARD 450 HYBRID PRO PACK (MISCELLANEOUS) ×3
TRENDGUARD 600 HYBRID PROC PK (MISCELLANEOUS)
TROCAR XCEL NON-BLD 11X100MML (ENDOMECHANICALS) ×5 IMPLANT
TROCAR XCEL NON-BLD 5MMX100MML (ENDOMECHANICALS) ×3 IMPLANT
WARMER LAPAROSCOPE (MISCELLANEOUS) ×3 IMPLANT
WATER STERILE IRR 1000ML POUR (IV SOLUTION) ×3 IMPLANT

## 2015-12-13 NOTE — Anesthesia Preprocedure Evaluation (Addendum)
Anesthesia Evaluation  Patient identified by MRN, date of birth, ID band Patient awake    Reviewed: Allergy & Precautions, H&P , NPO status , Patient's Chart, lab work & pertinent test results  Airway Mallampati: I  TM Distance: >3 FB Neck ROM: full    Dental no notable dental hx. (+) Teeth Intact   Pulmonary neg pulmonary ROS, former smoker,    Pulmonary exam normal        Cardiovascular negative cardio ROS Normal cardiovascular exam     Neuro/Psych negative neurological ROS     GI/Hepatic negative GI ROS, Neg liver ROS,   Endo/Other  negative endocrine ROS  Renal/GU negative Renal ROS     Musculoskeletal   Abdominal Normal abdominal exam  (+)   Peds  Hematology negative hematology ROS (+) HIV,   Anesthesia Other Findings   Reproductive/Obstetrics negative OB ROS                             Anesthesia Physical Anesthesia Plan  ASA: II  Anesthesia Plan: General   Post-op Pain Management:    Induction: Intravenous  Airway Management Planned: Oral ETT  Additional Equipment:   Intra-op Plan:   Post-operative Plan: Extubation in OR  Informed Consent: I have reviewed the patients History and Physical, chart, labs and discussed the procedure including the risks, benefits and alternatives for the proposed anesthesia with the patient or authorized representative who has indicated his/her understanding and acceptance.   Dental Advisory Given  Plan Discussed with: CRNA and Surgeon  Anesthesia Plan Comments:         Anesthesia Quick Evaluation

## 2015-12-13 NOTE — Transfer of Care (Signed)
Immediate Anesthesia Transfer of Care Note  Patient: Jacqueline Dawson  Procedure(s) Performed: Procedure(s): LAPAROSCOPIC OVARIAN CYSTECTOMY (Left)  Patient Location: PACU  Anesthesia Type:General  Level of Consciousness: sedated  Airway & Oxygen Therapy: Patient Spontanous Breathing and Patient connected to nasal cannula oxygen  Post-op Assessment: Report given to RN  Post vital signs: Reviewed and stable  Last Vitals:  Vitals:   12/13/15 1201  BP: (!) 115/99  Pulse: 88  Resp: 16  Temp: 36.8 C    Last Pain:  Vitals:   12/13/15 1201  TempSrc: Oral      Patients Stated Pain Goal: 3 ([id number])  Complications: No apparent anesthesia complications

## 2015-12-13 NOTE — Interval H&P Note (Signed)
History and Physical Interval Note:  12/13/2015 1:30 PM  Jacqueline Dawson  has presented today for surgery, with the diagnosis of Left Ovarian Cyst  The various methods of treatment have been discussed with the patient and family. After consideration of risks, benefits and other options for treatment, the patient has consented to  Procedure(s): LAPAROSCOPIC OVARIAN CYSTECTOMY (Left) as a surgical intervention .  The patient's history has been reviewed, patient examined, no change in status, stable for surgery.  I have reviewed the patient's chart and labs.  Questions were answered to the patient's satisfaction.     Delice Lesch

## 2015-12-13 NOTE — Anesthesia Procedure Notes (Signed)
Procedure Name: Intubation Date/Time: 12/13/2015 2:38 PM Performed by: Casimer Lanius A Pre-anesthesia Checklist: Patient identified, Emergency Drugs available, Suction available and Patient being monitored Patient Re-evaluated:Patient Re-evaluated prior to inductionOxygen Delivery Method: Circle system utilized and Simple face mask Preoxygenation: Pre-oxygenation with 100% oxygen Intubation Type: IV induction Ventilation: Mask ventilation without difficulty Laryngoscope Size: Mac and 3 Grade View: Grade II Tube type: Oral Tube size: 7.0 mm Number of attempts: 1 Airway Equipment and Method: Stylet Placement Confirmation: ETT inserted through vocal cords under direct vision,  positive ETCO2 and breath sounds checked- equal and bilateral Secured at: 20 (right lip) cm Tube secured with: Tape Dental Injury: Teeth and Oropharynx as per pre-operative assessment

## 2015-12-13 NOTE — Op Note (Signed)
Preop Diagnosis: LEFT OVARIAN CYST  Postop Diagnosis: LEFT OVARIAN CYST    Procedure: LAPAROSCOPIC LEFT OVARIAN CYSTECTOMY   Anesthesia: General   Anesthesiologist: Lyn Hollingshead, MD   Attending: Everett Graff, MD   Assistant:  Earnstine Regal, PA-C  Findings: Left ovarian cysts - 3 removed  Pathology: 3 cysts with cyst walls and fluid  Fluids: 1700 cc  UOP: 50 cc  EBL: 5 cc  Complications: None  Procedure: The patient was taken to the operating room after the risks, benefits, alternatives, complications, treatment options, and expected outcomes were discussed with the patient. The patient verbalized understanding, the patient concurred with the proposed plan and consent signed and witnessed. The patient was taken to the Operating Room and identified as Jacqueline Dawson and the procedure verified as Laparoscopic Left Ovarian Cystectomy.  The patient was placed under general anesthesia per anesthesia staff, the patient was placed in modified dorsal lithotomy position and was prepped, draped, and catheterized in the normal, sterile fashion.  A Time Out was held and the above information confirmed.  The cervix was visualized and an intrauterine manipulator was placed.  A 10 mm umbilical incision was then performed. Veress needle was passed and pneumoperitoneum was established. A 10 mm trocar was advanced into the intraabdominal cavity, the laparoscope was introduced and findings as noted above.  Patient was placed in trendelenburg and marcaine injected in the LLQ and a 5 mm incision was made and 5 mm trocar advanced into the intraabdominal cavity.  The same was done in the RLQ and a 10 mm in the suprapubic area.   Monopolar scissors were used to incise over each left ovarian cyst and cysts were dissected out without difficulty.  The ovarian bed was cauterized with Kleppinger for hemostasis.   The 10 mm suprapubic trocar was removed and fascia repaired with 0 vicryl.  Right and  left trocars were removed as well under direct visualization.  Pneumoperitoneum was relieved and umbilical trocar removed under direct visualization.  The umbilical fascia was repaired with 0 vicryl.  The 10 mm skin incisions were repaired with 3-0 monocryl via a subcuticular stitch and liquaband was applied to all incisions.  Sponge, instrument, lap and needle counts were correct.  The patient tolerated the procedure well and was awaiting extubation and transfer to the recovery room.

## 2015-12-13 NOTE — Discharge Instructions (Signed)
DISCHARGE INSTRUCTIONS: Laparoscopy  The following instructions have been prepared to help you care for yourself upon your return home today.  Wound care:  Do not get the incision wet for the first 24 hours. The incision should be kept clean and dry.  The Band-Aids or dressings may be removed the day after surgery.  Should the incision become sore, red, and swollen after the first week, check with your doctor.  Personal hygiene:  Shower the day after your procedure.  Activity and limitations:  Do NOT drive or operate any equipment today.  Do NOT lift anything more than 15 pounds for 2-3 weeks after surgery.  Do NOT rest in bed all day.  Walking is encouraged. Walk each day, starting slowly with 5-minute walks 3 or 4 times a day. Slowly increase the length of your walks.  Walk up and down stairs slowly.  Do NOT do strenuous activities, such as golfing, playing tennis, bowling, running, biking, weight lifting, gardening, mowing, or vacuuming for 2-4 weeks. Ask your doctor when it is okay to start.  Diet: Eat a light meal as desired this evening. You may resume your usual diet tomorrow.  Return to work: This is dependent on the type of work you do. For the most part you can return to a desk job within a week of surgery. If you are more active at work, please discuss this with your doctor.  What to expect after your surgery: You may have a slight burning sensation when you urinate on the first day. You may have a very small amount of blood in the urine. Expect to have a small amount of vaginal discharge/light bleeding for 1-2 weeks. It is not unusual to have abdominal soreness and bruising for up to 2 weeks. You may be tired and need more rest for about 1 week. You may experience shoulder pain for 24-72 hours. Lying flat in bed may relieve it.  Call your doctor for any of the following:  Develop a fever of 100.4 or greater  Inability to urinate 6 hours after discharge from  hospital  Severe pain not relieved by pain medications  Persistent of heavy bleeding at incision site  Redness or swelling around incision site after a week  Increasing nausea or vomiting  Patient Signature________________________________________ Nurse Signature_________________________________________ Wound care:  Do not get the incision wet for the first 24 hours. The incision should be kept clean and dry.  The Band-Aids or dressings may be removed the day after surgery.  Should the incision become sore, red, and swollen after the first week, check with your doctor.  Personal hygiene:  Shower the day after your procedure.  Activity and limitations:  Do NOT drive or operate any equipment today.  Do NOT lift anything more than 15 pounds for 2-3 weeks after surgery.  Do NOT rest in bed all day.  Walking is encouraged. Walk each day, starting slowly with 5-minute walks 3 or 4 times a day. Slowly increase the length of your walks.  Walk up and down stairs slowly.  Do NOT do strenuous activities, such as golfing, playing tennis, bowling, running, biking, weight lifting, gardening, mowing, or vacuuming for 2-4 weeks. Ask your doctor when it is okay to start.  Diet: Eat a light meal as desired this evening. You may resume your usual diet tomorrow.  Return to work: This is dependent on the type of work you do. For the most part you can return to a desk job within a week of  surgery. If you are more active at work, please discuss this with your doctor.  What to expect after your surgery: You may have a slight burning sensation when you urinate on the first day. You may have a very small amount of blood in the urine. Expect to have a small amount of vaginal discharge/light bleeding for 1-2 weeks. It is not unusual to have abdominal soreness and bruising for up to 2 weeks. You may be tired and need more rest for about 1 week. You may experience shoulder pain for 24-72 hours. Lying flat  in bed may relieve it.  Call your doctor for any of the following:  Develop a fever of 100.4 or greater  Inability to urinate 6 hours after discharge from hospital  Severe pain not relieved by pain medications  Persistent of heavy bleeding at incision site  Redness or swelling around incision site after a week  Increasing nausea or vomiting   Take Colace (Docusate Sodium/Stool Softener) 100 mg 2-3 times daily while taking narcotic pain medicine to avoid constipation or until bowel movements are regular. Take Naproxen 550 mg  with food twice a day for 5 days then as needed for pain  You may drive after 24 hours You may walk up steps You may shower tomorrow  You may resume a regular diet Keep incisions clean and dry, remove dressing in 5 days Avoid anything in vagina until after your post-operative visit

## 2015-12-13 NOTE — Anesthesia Postprocedure Evaluation (Signed)
Anesthesia Post Note  Patient: Jacqueline Dawson  Procedure(s) Performed: Procedure(s) (LRB): LAPAROSCOPIC OVARIAN CYSTECTOMY (Left)  Patient location during evaluation: PACU Anesthesia Type: General Level of consciousness: awake and sedated Pain management: pain level controlled Vital Signs Assessment: post-procedure vital signs reviewed and stable Respiratory status: spontaneous breathing Cardiovascular status: stable Postop Assessment: no signs of nausea or vomiting Anesthetic complications: no     Last Vitals:  Vitals:   12/13/15 1645 12/13/15 1713  BP: 115/80 124/88  Pulse: 94 93  Resp: 10 18  Temp:  36.7 C    Last Pain:  Vitals:   12/13/15 1713  TempSrc:   PainSc: 2    Pain Goal: Patients Stated Pain Goal: 3 (12/13/15 1201)               [place]

## 2015-12-14 ENCOUNTER — Encounter (HOSPITAL_COMMUNITY): Payer: Self-pay | Admitting: Obstetrics and Gynecology

## 2015-12-17 ENCOUNTER — Ambulatory Visit: Payer: BC Managed Care – PPO

## 2015-12-17 ENCOUNTER — Other Ambulatory Visit: Payer: Self-pay | Admitting: Infectious Diseases

## 2015-12-17 DIAGNOSIS — F324 Major depressive disorder, single episode, in partial remission: Secondary | ICD-10-CM

## 2015-12-17 DIAGNOSIS — F329 Major depressive disorder, single episode, unspecified: Secondary | ICD-10-CM

## 2015-12-17 DIAGNOSIS — F411 Generalized anxiety disorder: Secondary | ICD-10-CM

## 2015-12-17 DIAGNOSIS — F32A Depression, unspecified: Secondary | ICD-10-CM

## 2015-12-17 NOTE — BH Specialist Note (Signed)
Jacqueline Dawson reported that her surgery went well last Thursday and she is currently recovering well. She goes back to work tomorrow and is a bit nervous because the female friend she has been talking to is starting work this week in her office. This will be an adjustment for both of them, and will likely be awkward at first, she fears, because their relationship is just beginning. We talked about how your mind goes to fearful places when you have too much time on your hands, which she has had due to the surgery. She looks forward to getting back to work.  Plan to meet again in one week. [name], LCSW

## 2015-12-24 ENCOUNTER — Ambulatory Visit: Payer: BC Managed Care – PPO

## 2015-12-24 DIAGNOSIS — F411 Generalized anxiety disorder: Secondary | ICD-10-CM

## 2015-12-24 NOTE — BH Specialist Note (Signed)
Jacqueline Dawson talked about her worries about her 40 year old son, who is a Museum/gallery exhibitions officer in college and seems to be distracted and not doing what he needs to. She worries that she has not been a good enough parent, but I reassured her that based on what she has shared, she seems to be a very good and conscientious parent. She says she wants to be able to focus on herself more now and needs permission to do that, which I gave her. Plan to meet again in one week. [name], LCSW

## 2016-01-01 ENCOUNTER — Ambulatory Visit: Payer: BC Managed Care – PPO

## 2016-01-01 DIAGNOSIS — F4322 Adjustment disorder with anxiety: Secondary | ICD-10-CM
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
## 2016-01-07 ENCOUNTER — Ambulatory Visit: Payer: BC Managed Care – PPO

## 2016-01-07 DIAGNOSIS — F4322 Adjustment disorder with anxiety: Secondary | ICD-10-CM

## 2016-01-07 NOTE — BH Specialist Note (Signed)
Jacqueline Dawson was in good spirits today and I helped her process emotions regarding relationship issues. She continues to struggle with knowing what is going on with her and a female friend due to poor communication. She is taking good care of herself though by not letting him define her happiness and I praised her for this. Plan to meet again in one week. [name], LCSW

## 2016-01-14 ENCOUNTER — Other Ambulatory Visit: Payer: BC Managed Care – PPO

## 2016-01-14 ENCOUNTER — Ambulatory Visit: Payer: BC Managed Care – PPO

## 2016-01-14 DIAGNOSIS — F331 Major depressive disorder, recurrent, moderate: Secondary | ICD-10-CM

## 2016-01-14 DIAGNOSIS — B20 Human immunodeficiency virus [HIV] disease: Secondary | ICD-10-CM

## 2016-01-14 NOTE — BH Specialist Note (Signed)
Jacqueline Dawson was in a good mood today and reported that the man she has been talking to for the past 2 months is coming back tomorrow from a weeklong trip and she is excited to see him again. She wants to know if they are going to really start being a "couple" and stop just talking about it. I encouraged her to ask for what she wants. She talked about an old boyfriend who keeps calling and texting her, but is married. She agreed with me that she needs to put a stop to this if she wants to pursue a new relationship with someone. Plan to meet again in one week. [name], LCSW

## 2016-01-15 DIAGNOSIS — F4322 Adjustment disorder with anxiety: Secondary | ICD-10-CM | POA: Insufficient documentation

## 2016-01-15 LAB — T-HELPER CELL (CD4) - (RCID CLINIC ONLY)
CD4 T CELL ABS: 310 /uL — AB (ref 400–2700)
CD4 T CELL HELPER: 23 % — AB (ref 33–55)

## 2016-01-15 LAB — HIV-1 RNA QUANT-NO REFLEX-BLD
HIV 1 RNA QUANT: 46 {copies}/mL — AB (ref <20)
HIV-1 RNA Quant, Log: 1.66 Log copies/mL — ABNORMAL HIGH (ref <1.30)

## 2016-01-22 ENCOUNTER — Ambulatory Visit (INDEPENDENT_AMBULATORY_CARE_PROVIDER_SITE_OTHER): Payer: BC Managed Care – PPO

## 2016-01-22 DIAGNOSIS — F330 Major depressive disorder, recurrent, mild: Secondary | ICD-10-CM | POA: Diagnosis not present

## 2016-01-22 NOTE — BH Specialist Note (Addendum)
Jacqueline Dawson reported that she is becoming frustrated with her female friend, [name], and said they had an argument last week. She asked if he was seeing anyone else and he said no, but said he was "talking" to other women. So we discussed her relationship with him and how he is likely perceiving her as being "needy" right now. So I suggested she back off and we talked about her revisiting online dating. She feels like this may be a good thing and I pointed out to her that she has a lot to offer for a man who is looking for someone to be in a relationship. Plan to meet again in one week. [name], LCSW

## 2016-01-25 ENCOUNTER — Other Ambulatory Visit: Payer: Self-pay | Admitting: Obstetrics and Gynecology

## 2016-01-28 ENCOUNTER — Ambulatory Visit: Payer: BC Managed Care – PPO

## 2016-01-28 ENCOUNTER — Ambulatory Visit (INDEPENDENT_AMBULATORY_CARE_PROVIDER_SITE_OTHER): Payer: BC Managed Care – PPO | Admitting: Infectious Diseases

## 2016-01-28 ENCOUNTER — Encounter: Payer: Self-pay | Admitting: Infectious Diseases

## 2016-01-28 VITALS — BP 135/91 | HR 80 | Temp 97.8°F | Ht 66.0 in | Wt 119.5 lb

## 2016-01-28 DIAGNOSIS — Z23 Encounter for immunization: Secondary | ICD-10-CM

## 2016-01-28 DIAGNOSIS — B20 Human immunodeficiency virus [HIV] disease: Secondary | ICD-10-CM | POA: Diagnosis not present

## 2016-01-28 DIAGNOSIS — R87613 High grade squamous intraepithelial lesion on cytologic smear of cervix (HGSIL): Secondary | ICD-10-CM | POA: Diagnosis not present

## 2016-01-28 DIAGNOSIS — Z113 Encounter for screening for infections with a predominantly sexual mode of transmission: Secondary | ICD-10-CM

## 2016-01-28 DIAGNOSIS — Z79899 Other long term (current) drug therapy: Secondary | ICD-10-CM

## 2016-01-28 DIAGNOSIS — F324 Major depressive disorder, single episode, in partial remission: Secondary | ICD-10-CM | POA: Diagnosis not present

## 2016-01-28 NOTE — Progress Notes (Signed)
Subjective:    Patient ID: Jacqueline Dawson, female    DOB: 10-Nov-1975, 40 y.o.   MRN: [id number]  HPI 40 yo F with hx of HIV+ and previous abn PAP/LEEP. 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 She had endometrial bx 05-2015 that was benign. 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  She had endometrial ablation on 01-25-16.  Previously was changed from [place] to [place]. Stopped complera recently due to persistent blips. HLA (-) 07-2013. Was then chagned to triumeq 09-2013. Has occas missed does.   Has 7 and 19 ( at [employer])yo kids.  No Fhx of breast Ca.She had mammo at work 11-11-15 (nl).   HIV 1 RNA Quant (copies/mL)  Date Value  01/14/2016 46 (H)  07/11/2015 <20  01/08/2015 <20   CD4 T Cell Abs (/uL)  Date Value  01/14/2016 310 (L)  07/11/2015 370 (L)  01/08/2015 370 (L)    Review of Systems  Constitutional: Negative for appetite change and unexpected weight change.  Gastrointestinal: Negative for diarrhea and nausea.  Genitourinary: Negative for difficulty urinating.  irregular periods.      Objective:   Physical Exam  Constitutional: She appears well-developed.  HENT:  Mouth/Throat: No oropharyngeal exudate.  Eyes: EOM are normal. Pupils are equal, round, and reactive to light.  Neck: Neck supple.  Cardiovascular: Normal rate, regular rhythm and normal heart sounds.   Pulmonary/Chest: Effort normal and breath sounds normal.  Abdominal: Soft. Bowel sounds are normal. There is no tenderness. There is no rebound.  Lymphadenopathy:    She has no cervical adenopathy.      Assessment & Plan:

## 2016-01-28 NOTE — Assessment & Plan Note (Signed)
Greatly appreciate Dr Mancel Bale f/u.

## 2016-01-28 NOTE — Assessment & Plan Note (Signed)
Has quit taking zoloft.  Has been f/u with Jaye Beagle much better.

## 2016-01-28 NOTE — Assessment & Plan Note (Signed)
doing well some missed doses.  Has condoms.  Getting flu and mening today.  Will see her back in 6 months.

## 2016-02-12 ENCOUNTER — Ambulatory Visit: Payer: BC Managed Care – PPO

## 2016-02-12 DIAGNOSIS — F4323 Adjustment disorder with mixed anxiety and depressed mood: Secondary | ICD-10-CM

## 2016-02-12 NOTE — BH Specialist Note (Signed)
Saly was frustrated today about how her relationship with a man has been going. She and I identified how he is not making her a priority, yet he is doing things with other people. She suspects he is seeing another woman, but I pointed out that her evidence is unclear but what is very clear is that she is not being given the time that she wants from him. She did say no to an old boyfriend, who is married but wanted to get together with her. I praised her for setting that boundary. Plan to meet again in one week. [name], LCSW

## 2016-02-19 ENCOUNTER — Ambulatory Visit: Payer: BC Managed Care – PPO

## 2016-02-19 DIAGNOSIS — F4322 Adjustment disorder with anxiety: Secondary | ICD-10-CM

## 2016-02-19 NOTE — BH Specialist Note (Signed)
Korinn talked about her relationship with her female friend, [name], and how he seems to have stepped up his communication with her after she told him that that is what she wanted. This has made her feel better about things and she also is recognizing her own distortions of things based on her past history of guys not being honest with her. She decided that she needs to tell him she has "trust issues" based on past relationships and not based on anything he has done. I agreed with this plan.  Plan to meet again in one week. [name], LCSW

## 2016-02-26 ENCOUNTER — Ambulatory Visit: Payer: BC Managed Care – PPO

## 2016-02-26 DIAGNOSIS — F4323 Adjustment disorder with mixed anxiety and depressed mood: Secondary | ICD-10-CM
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
## 2016-03-04 ENCOUNTER — Ambulatory Visit: Payer: BC Managed Care – PPO

## 2016-03-04 NOTE — BH Specialist Note (Unsigned)
Jacqueline Dawson came in today and talked about relationships. I provided psycho-education on EMDR and discussed trying this next session to help her with events from her past that impact her relationships today. Plan to meet in one week. [name], LCSW

## 2016-03-08 ENCOUNTER — Other Ambulatory Visit: Payer: Self-pay | Admitting: Infectious Diseases

## 2016-03-08 DIAGNOSIS — B20 Human immunodeficiency virus [HIV] disease: Secondary | ICD-10-CM

## 2016-03-10 ENCOUNTER — Other Ambulatory Visit: Payer: Self-pay | Admitting: Infectious Diseases

## 2016-03-10 DIAGNOSIS — B20 Human immunodeficiency virus [HIV] disease: Secondary | ICD-10-CM

## 2016-03-11 ENCOUNTER — Ambulatory Visit: Payer: BC Managed Care – PPO

## 2016-03-11 ENCOUNTER — Other Ambulatory Visit: Payer: Self-pay | Admitting: *Deleted

## 2016-03-11 DIAGNOSIS — F4323 Adjustment disorder with mixed anxiety and depressed mood: Secondary | ICD-10-CM

## 2016-03-11 DIAGNOSIS — B20 Human immunodeficiency virus [HIV] disease: Secondary | ICD-10-CM

## 2016-03-11 MED ORDER — DRONABINOL 5 MG PO CAPS: ORAL_CAPSULE | ORAL | 0 refills | Status: DC
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
## 2016-03-17 ENCOUNTER — Ambulatory Visit: Payer: BC Managed Care – PPO

## 2016-03-17 DIAGNOSIS — F4323 Adjustment disorder with mixed anxiety and depressed mood: Secondary | ICD-10-CM

## 2016-03-17 NOTE — BH Specialist Note (Signed)
Jacqueline Dawson continues to stress about her relationship with her female friend Jacqueline Dawson, but did report that the EMDR session last week was very effective in helping her reprocess the negative emotions associated with a previous relationship. She said she no longer tries to compare Jacqueline Dawson to this previous boyfriend. Plan to meet again in one week. [name], LCSW

## 2016-03-24 ENCOUNTER — Ambulatory Visit: Payer: BC Managed Care – PPO

## 2016-03-24 DIAGNOSIS — F4323 Adjustment disorder with mixed anxiety and depressed mood: Secondary | ICD-10-CM

## 2016-03-24 NOTE — BH Specialist Note (Signed)
Jacqueline Dawson talked about her childhood today at my prompting to look for causes of her self esteem issues and she said both her mother and her grandmother were hypercritical of her from early on. She said they constantly critiqued her outward appearance and her behavior, rarely giving compliments or even saying they loved her, even though she knew they did.  She was tearful as she talked about this and we agreed to do another EMDR session re: this. Plan to meet again in one week. [name], LCS

## 2016-03-31 ENCOUNTER — Ambulatory Visit: Payer: BC Managed Care – PPO

## 2016-03-31 DIAGNOSIS — F331 Major depressive disorder, recurrent, moderate: Secondary | ICD-10-CM

## 2016-03-31 NOTE — BH Specialist Note (Signed)
Jacqueline Dawson was tearful at times today as she talked about ongoing relationship turmoil. She said she snooped on her "boyfriend's" work Teaching laboratory technician last Friday and saw emails to 2 other women. She has decided to "pull back" from the relationship and let him know that she wants exclusivity. However, later, she confessed that she has not been exclusive with him, seeing a young lawyer friend. Plan to meet again in one week. [name], LCSW

## 2016-04-07 ENCOUNTER — Ambulatory Visit: Payer: BC Managed Care – PPO

## 2016-04-07 DIAGNOSIS — F4323 Adjustment disorder with mixed anxiety and depressed mood: Secondary | ICD-10-CM

## 2016-04-07 NOTE — BH Specialist Note (Signed)
Cybele continues to express frustration over relationship issues, reporting that the man she has been seeing did not do anything for her birthday last week, but says he plans to do something. She is on the fence about what to do about him and at times jumps to conclusions that he is seeing someone else, which I pointed out she did not have solid evidence of. Plan to meet again in one week. [name], LCSW

## 2016-04-14 ENCOUNTER — Ambulatory Visit: Payer: BC Managed Care – PPO

## 2016-04-22 ENCOUNTER — Ambulatory Visit: Payer: BC Managed Care – PPO

## 2016-04-28 ENCOUNTER — Ambulatory Visit: Payer: BC Managed Care – PPO

## 2016-04-28 DIAGNOSIS — F4322 Adjustment disorder with anxiety: Secondary | ICD-10-CM

## 2016-04-28 NOTE — BH Specialist Note (Signed)
Jacqueline Dawson reported today that she has decided to just feel good and it has made a difference in her life. She said her female friend, [name] has noticed the difference and has responded by communicating more with her and being more attentive. I praised her for making this change. She still has worries about the relationship, but overall feels much better about it. Plan to meet again in one week. [name], LCSW

## 2016-05-07 ENCOUNTER — Ambulatory Visit: Payer: BC Managed Care – PPO

## 2016-05-12 ENCOUNTER — Ambulatory Visit: Payer: BC Managed Care – PPO

## 2016-05-19 ENCOUNTER — Ambulatory Visit: Payer: BC Managed Care – PPO

## 2016-05-19 DIAGNOSIS — F4323 Adjustment disorder with mixed anxiety and depressed mood: Secondary | ICD-10-CM
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
## 2016-05-23 ENCOUNTER — Other Ambulatory Visit: Payer: Self-pay | Admitting: *Deleted

## 2016-05-23 DIAGNOSIS — B20 Human immunodeficiency virus [HIV] disease: Secondary | ICD-10-CM

## 2016-05-23 MED ORDER — DRONABINOL 5 MG PO CAPS: ORAL_CAPSULE | ORAL | 0 refills | Status: DC

## 2016-05-28 ENCOUNTER — Ambulatory Visit: Payer: BC Managed Care – PPO

## 2016-06-03 ENCOUNTER — Ambulatory Visit (INDEPENDENT_AMBULATORY_CARE_PROVIDER_SITE_OTHER): Payer: BC Managed Care – PPO

## 2016-06-03 DIAGNOSIS — F4323 Adjustment disorder with mixed anxiety and depressed mood: Secondary | ICD-10-CM
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
## 2016-06-11 ENCOUNTER — Ambulatory Visit (INDEPENDENT_AMBULATORY_CARE_PROVIDER_SITE_OTHER): Payer: BC Managed Care – PPO

## 2016-06-11 DIAGNOSIS — F4323 Adjustment disorder with mixed anxiety and depressed mood: Secondary | ICD-10-CM

## 2016-06-11 NOTE — BH Specialist Note (Signed)
Catalia reported that things are going very well with her and her "man", [name]. She is finally accepting that he really does love her and is not interested in seeing other women.  She said she realizes that she thinks about this because of her past and it has nothing to do with [name]. I informed her that [employer] is willing to schedule my [facility] clients for intakes, so they don't have to go to the "walk in clinic". She plans to call them.  I told her she has made real progress over the past few months and I have enjoyed seeing that happen. [name], LCSW

## 2016-06-30 ENCOUNTER — Other Ambulatory Visit: Payer: Self-pay | Admitting: Infectious Diseases

## 2016-07-16 ENCOUNTER — Other Ambulatory Visit: Payer: BC Managed Care – PPO

## 2016-07-16 DIAGNOSIS — B20 Human immunodeficiency virus [HIV] disease: Secondary | ICD-10-CM

## 2016-07-16 DIAGNOSIS — Z79899 Other long term (current) drug therapy: Secondary | ICD-10-CM

## 2016-07-16 DIAGNOSIS — Z113 Encounter for screening for infections with a predominantly sexual mode of transmission: Secondary | ICD-10-CM

## 2016-07-16 LAB — COMPREHENSIVE METABOLIC PANEL
ALK PHOS: 37 U/L (ref 33–115)
ALT: 13 U/L (ref 6–29)
AST: 17 U/L (ref 10–30)
Albumin: 4.2 g/dL (ref 3.6–5.1)
BUN: 10 mg/dL (ref 7–25)
CO2: 23 mmol/L (ref 20–31)
Calcium: 9.2 mg/dL (ref 8.6–10.2)
Chloride: 105 mmol/L (ref 98–110)
Creat: 0.86 mg/dL (ref 0.50–1.10)
Glucose, Bld: 85 mg/dL (ref 65–99)
POTASSIUM: 4.6 mmol/L (ref 3.5–5.3)
Sodium: 140 mmol/L (ref 135–146)
TOTAL PROTEIN: 7 g/dL (ref 6.1–8.1)
Total Bilirubin: 0.3 mg/dL (ref 0.2–1.2)

## 2016-07-16 LAB — LIPID PANEL
CHOL/HDL RATIO: 1.9 ratio (ref <5.0)
CHOLESTEROL: 163 mg/dL (ref <200)
HDL: 87 mg/dL (ref >50)
LDL Cholesterol: 64 mg/dL (ref <100)
TRIGLYCERIDES: 60 mg/dL (ref <150)
VLDL: 12 mg/dL (ref <30)

## 2016-07-16 LAB — CBC
HEMATOCRIT: 38.3 % (ref 35.0–45.0)
Hemoglobin: 12.4 g/dL (ref 11.7–15.5)
MCH: 28.6 pg (ref 27.0–33.0)
MCHC: 32.4 g/dL (ref 32.0–36.0)
MCV: 88.2 fL (ref 80.0–100.0)
MPV: 9.8 fL (ref 7.5–12.5)
Platelets: 272 10*3/uL (ref 140–400)
RBC: 4.34 MIL/uL (ref 3.80–5.10)
RDW: 13.2 % (ref 11.0–15.0)
WBC: 6.5 10*3/uL (ref 3.8–10.8)

## 2016-07-17 LAB — T-HELPER CELL (CD4) - (RCID CLINIC ONLY)
CD4 T CELL ABS: 340 /uL — AB (ref 400–2700)
CD4 T CELL HELPER: 22 % — AB (ref 33–55)

## 2016-07-17 LAB — RPR

## 2016-07-18 LAB — HIV-1 RNA QUANT-NO REFLEX-BLD
HIV 1 RNA Quant: <20 copies/mL — AB
HIV-1 RNA Quant, Log: <1.3 Log copies/mL — AB

## 2016-07-30 ENCOUNTER — Encounter: Payer: Self-pay | Admitting: Infectious Diseases

## 2016-07-30 ENCOUNTER — Ambulatory Visit (INDEPENDENT_AMBULATORY_CARE_PROVIDER_SITE_OTHER): Payer: BC Managed Care – PPO | Admitting: Infectious Diseases

## 2016-07-30 VITALS — BP 131/82 | HR 103 | Temp 98.3°F | Ht 66.0 in | Wt 116.0 lb

## 2016-07-30 DIAGNOSIS — F4322 Adjustment disorder with anxiety: Secondary | ICD-10-CM

## 2016-07-30 DIAGNOSIS — B20 Human immunodeficiency virus [HIV] disease: Secondary | ICD-10-CM | POA: Diagnosis not present

## 2016-07-30 DIAGNOSIS — R87613 High grade squamous intraepithelial lesion on cytologic smear of cervix (HGSIL): Secondary | ICD-10-CM

## 2016-07-30 DIAGNOSIS — Z23 Encounter for immunization: Secondary | ICD-10-CM

## 2016-07-30 LAB — TSH: TSH: 0.93 mIU/L

## 2016-07-30 MED ORDER — ESCITALOPRAM OXALATE 10 MG PO TABS: 10.0000 mg | ORAL_TABLET | Freq: Every day | ORAL | 3 refills | Status: DC

## 2016-07-30 NOTE — Assessment & Plan Note (Signed)
She has GYN f/u and is doing well.

## 2016-07-30 NOTE — Assessment & Plan Note (Addendum)
She is doing well.  Offered/refused condoms.  2nd mening See her back in 3 months to recheck lexapro efficacy.  mammo at work if not via her GYN.  Wants to get her hormone levels checked, worried she is perimenopausal. Worried that this is affecting her mood

## 2016-07-30 NOTE — Assessment & Plan Note (Signed)
Will try her on lexapro (she has researched this).  offered her to see sherrie- she defers.

## 2016-07-30 NOTE — Progress Notes (Signed)
   Subjective:    Patient ID: Jacqueline Dawson, female    DOB: 1975-09-14, 41 y.o.   MRN: [id number]  HPI 41 yo F with hx of HIV+ and previous abn PAP/LEEP. 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 She had endometrial bx 05-2015 that was benign. 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  She had endometrial ablation on 01-25-16. Had f/u 04-2016, normal pap. Has f/u in 6 months.  Previously was changed from [place] to [place]. Stopped complera recently due to persistent blips. HLA (-) 07-2013. Was then changed to triumeq 09-2013. Has occas missed does.   Has 7 and 19 ( at [employer]) yo kids.  No Fhx of breast Ca.She had mammo at work 11-11-15 (nl).   No problems with triumeq. Wants non-daily rx.   HIV 1 RNA Quant (copies/mL)  Date Value  07/16/2016 <20 DETECTED (A)  01/14/2016 46 (H)  07/11/2015 <20   CD4 T Cell Abs (/uL)  Date Value  07/16/2016 340 (L)  01/14/2016 310 (L)  07/11/2015 370 (L)    Review of Systems  Constitutional: Negative for appetite change and unexpected weight change.  Respiratory: Negative for cough and shortness of breath.   Gastrointestinal: Negative for constipation and diarrhea.  Genitourinary: Negative for difficulty urinating and vaginal bleeding.  Psychiatric/Behavioral: Positive for dysphoric mood. The patient is nervous/anxious.   depressed- "I have to coax myself into being happy." "very stressed, needs something that makes not me dread getting up in the" AM. Wants to try lexapro.      Objective:   Physical Exam  Constitutional: She appears well-developed and well-nourished.  HENT:  Mouth/Throat: No oropharyngeal exudate.  Eyes: EOM are normal. Pupils are equal, round, and reactive to light.  Neck: Neck supple.  Cardiovascular: Normal rate, regular rhythm and normal heart sounds.   Pulmonary/Chest: Effort normal and breath sounds normal.  Abdominal: Soft. Bowel sounds are normal. There is no tenderness. There is no rebound.    Musculoskeletal: She exhibits no edema.  Lymphadenopathy:    She has no cervical adenopathy.  Psychiatric: She has a normal mood and affect. Her behavior is normal. Thought content normal.      Assessment & Plan:

## 2016-07-30 NOTE — Addendum Note (Signed)
Addended by: Roma Kayser on: 07/30/2016 11:01 AM   Modules accepted: Orders

## 2016-07-31 LAB — FSH/LH
FSH: 25.4 m[IU]/mL
LH: 8.6 m[IU]/mL

## 2016-07-31 LAB — TESTOSTERONE: Testosterone: 29 ng/dL

## 2016-08-06 LAB — ESTROGENS, TOTAL: Estrogen: 169.4 pg/mL

## 2016-09-05 ENCOUNTER — Telehealth: Payer: Self-pay | Admitting: *Deleted

## 2016-09-05 DIAGNOSIS — B20 Human immunodeficiency virus [HIV] disease: Secondary | ICD-10-CM

## 2016-09-05 MED ORDER — ABACAVIR-DOLUTEGRAVIR-LAMIVUD 600-50-300 MG PO TABS: ORAL_TABLET | ORAL | 1 refills | Status: DC

## 2016-09-05 NOTE — Telephone Encounter (Signed)
Patient needing refill

## 2016-11-05 ENCOUNTER — Other Ambulatory Visit: Payer: Self-pay | Admitting: Infectious Diseases

## 2016-11-05 ENCOUNTER — Ambulatory Visit (INDEPENDENT_AMBULATORY_CARE_PROVIDER_SITE_OTHER): Payer: BC Managed Care – PPO | Admitting: Infectious Diseases

## 2016-11-05 ENCOUNTER — Ambulatory Visit (INDEPENDENT_AMBULATORY_CARE_PROVIDER_SITE_OTHER): Payer: BC Managed Care – PPO | Admitting: Licensed Clinical Social Worker

## 2016-11-05 ENCOUNTER — Encounter: Payer: Self-pay | Admitting: Infectious Diseases

## 2016-11-05 VITALS — BP 136/90 | HR 88 | Temp 99.0°F | Ht 66.0 in | Wt 124.0 lb

## 2016-11-05 DIAGNOSIS — F4322 Adjustment disorder with anxiety: Secondary | ICD-10-CM | POA: Diagnosis not present

## 2016-11-05 DIAGNOSIS — Z113 Encounter for screening for infections with a predominantly sexual mode of transmission: Secondary | ICD-10-CM | POA: Diagnosis not present

## 2016-11-05 DIAGNOSIS — R87613 High grade squamous intraepithelial lesion on cytologic smear of cervix (HGSIL): Secondary | ICD-10-CM

## 2016-11-05 DIAGNOSIS — B20 Human immunodeficiency virus [HIV] disease: Secondary | ICD-10-CM | POA: Diagnosis not present

## 2016-11-05 DIAGNOSIS — Z23 Encounter for immunization: Secondary | ICD-10-CM

## 2016-11-05 DIAGNOSIS — Z79899 Other long term (current) drug therapy: Secondary | ICD-10-CM | POA: Diagnosis not present

## 2016-11-05 MED ORDER — ESCITALOPRAM OXALATE 20 MG PO TABS: 10.0000 mg | ORAL_TABLET | Freq: Every day | ORAL | 3 refills | Status: DC

## 2016-11-05 NOTE — Assessment & Plan Note (Signed)
Doing well on triumeq- occas missed.  Not sexually active Offered/refused condoms.  Has gotten flu shot Will get mammo, pap.

## 2016-11-05 NOTE — Assessment & Plan Note (Signed)
Will increase her lexapro dose.  Will see her back in 6 months Will have her see sherry

## 2016-11-05 NOTE — Assessment & Plan Note (Signed)
Has gyn f/u.

## 2016-11-05 NOTE — Progress Notes (Signed)
   Subjective:    Patient ID: Jacqueline Dawson, female    DOB: 1975/03/07, 41 y.o.   MRN: [id number]  HPI 41 yo F with hx of HIV+ and previous abn PAP/LEEP. 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 She had endometrial bx 05-2015 that was benign. 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  She had endometrial ablation on 01-25-16. Had f/u 04-2016, normal pap. Has f/u in 6 months.  Previously was changed from [place] to [place]. Stopped complera recently due to persistent blips. HLA (-) 07-2013. Was then changed to triumeq 09-2013. Has occas missed does.   Has 8 and 20yo kids.  No Fhx of breast Ca.She had mammo at work 11-11-15 (nl).   Had a good summer.  At last visit was started on lexapro, she doubled the dose her self.  Had better anxiety control after doing this. Doing breathing exercises.  Has gained 8#.   HIV 1 RNA Quant (copies/mL)  Date Value  07/16/2016 <20 DETECTED (A)  01/14/2016 46 (H)  07/11/2015 <20   CD4 T Cell Abs (/uL)  Date Value  07/16/2016 340 (L)  01/14/2016 310 (L)  07/11/2015 370 (L)    Review of Systems  Constitutional: Negative for appetite change and unexpected weight change.  Respiratory: Negative for shortness of breath.   Gastrointestinal: Negative for constipation and diarrhea.  Genitourinary: Negative for difficulty urinating and menstrual problem.  Neurological: Positive for headaches.  Psychiatric/Behavioral: Positive for sleep disturbance. Negative for dysphoric mood.  occsa stress headaches.  Sleeps poorly, having night sweats. Has GYN f/u 11-21-16. Mammo next week.     Objective:   Physical Exam  Constitutional: She appears well-developed and well-nourished.  HENT:  Mouth/Throat: No oropharyngeal exudate.  Eyes: Pupils are equal, round, and reactive to light. EOM are normal.  Neck: Neck supple.  Cardiovascular: Normal rate, regular rhythm and normal heart sounds.   Pulmonary/Chest: Effort normal and breath sounds normal.    Abdominal: Soft. Bowel sounds are normal. There is no tenderness. There is no rebound.  Musculoskeletal: She exhibits no edema.  Lymphadenopathy:    She has no cervical adenopathy.  Psychiatric: She has a normal mood and affect. Thought content normal.          Assessment & Plan:

## 2016-11-06 ENCOUNTER — Telehealth: Payer: Self-pay | Admitting: Licensed Clinical Social Worker

## 2016-11-06 NOTE — BH Specialist Note (Signed)
Integrated Behavioral Health Initial Visit  MRN: [id number] Name: Jacqueline Dawson  Number of [place] Clinician visits:: 1/6 Session Start time: 9:40 am  Session End time: 10:10 am Total time: 30 minutes  Type of Service: [place] Interpretor:No. Interpretor Name and Language: N/A   Warm Hand Off Completed.       SUBJECTIVE: Jacqueline Dawson is a 41 y.o. female accompanied by self Patient was referred by Dr. Johnnye Sima for accumulated life stressors.  Patient reports the following symptoms/concerns: Patient reported that she has been having difficulty managing her current life stressors, which have led to increased worry.  Patient reported stressors related to her children, finances, and relationship.  Patient reported that she is struggling with independence/dependence issues with her 41 y/o and is struggling with trust in her relationship, as this is her first relationship in 4 years.  Patient reported that worry is impacting her sleep and reported being open to participate in sessions to learn relaxation techniques, good sleep hygiene, and mindfulness.    Patient reported that she is being prescribed Lexapro 20 mg by Dr. Johnnye Sima, and reported efficacy in improving mood and decreasing worry.  Patient denied experience of side effects.  Severity of problem: moderate  OBJECTIVE: Mood: Anxious and Affect: Appropriate Risk of harm to self or others: No plan to harm self or others  Thought process: circumstantial Thought content: logical  LIFE CONTEXT: Family and Social: Patient has a 41 y/o and a 41 y/o. School/Work: Patient reported that she enjoys works and has little work stressors. Self-Care: Patient is able to tend to her ADL's and is compliant with treatment. Life Changes: Patient has increased worry as life stressors have increased.  ASSESSMENT: Patient is currently experiencing anxiety and may benefit from continued  medication management and behavioral health services.  GOALS ADDRESSED: Patient will: 1. Reduce symptoms of: anxiety 2. Increase knowledge and/or ability of: coping skills, healthy habits and stress reduction  3. Demonstrate ability to: Increase healthy adjustment to current life circumstances and Increase adequate support systems for patient/family  INTERVENTIONS: Interventions utilized: Supportive Counseling and Sleep Hygiene   PLAN: 1. Follow up with behavioral health clinician on : Monday Oct 1st at 8:45 am 2. At next session will perform the GAD and PHQ-9 to assess severity of symptoms.   Sande Rives, [facility]

## 2016-11-06 NOTE — Telephone Encounter (Signed)
[facility] called patient and left message asking to reschedule appointment for Monday Oct 1st at 8:45 am due to pre-planned meeting outside of the office.  Jacqueline Dawson left available dates and times on the message and asked the patient to call back to select another appt.  Sande Rives, [facility]

## 2016-11-10 ENCOUNTER — Ambulatory Visit: Payer: BC Managed Care – PPO | Admitting: Licensed Clinical Social Worker

## 2016-11-17 ENCOUNTER — Ambulatory Visit (INDEPENDENT_AMBULATORY_CARE_PROVIDER_SITE_OTHER): Payer: BC Managed Care – PPO | Admitting: Licensed Clinical Social Worker

## 2016-11-17 DIAGNOSIS — F320 Major depressive disorder, single episode, mild: Secondary | ICD-10-CM

## 2016-11-17 DIAGNOSIS — F411 Generalized anxiety disorder: Secondary | ICD-10-CM

## 2016-11-17 NOTE — BH Specialist Note (Signed)
Integrated Behavioral Health Follow Up Visit  MRN: [id number] Name: YUETTE PUTNAM  Number of [place] Clinician visits: 2/6 Session Start time: 8:51 am  Session End time: 10:11 am Total time: 1 hour 22 mins  Type of Service: [place] Interpretor:No. Interpretor Name and Language: N/A  SUBJECTIVE: TENNESSEE HANLON is a 41 y.o. female accompanied by self Patient was referred by Dr. Johnnye Sima for accumulated life stressors.  Patient reports the following symptoms/concerns: Patient took the PHQ-9 and the GAD-7 assessments today (See scores below).  Patient reported that the Lexapro medication has been helping reduce anxiety and improve depressive symptoms.  Patient denied history of experiencing panic attacks.   When patient experienced anxiety the following physical symptoms occur: tight chest where it is hard to breath, stomach gets "bubbly", and has increased heart rate. Patient experiences worry about "everything" and denied having thoughts about worsening of somatic symptoms or death from anxiety symptoms.  Patient reported that she can tell the start of her anxiety symptoms, give a progression of the symptoms, and interrupt the progression by talking to a friend or crying.  Patient reported crying at least twice a week by herself.  Patient was able to identify early messages and socialization with anxiety and reported the majority of the women in her family engaging in nervous, worry and having "nerves in our stomach".  Patient also verbalized having a matriarchal family with the 68 of women being "controlling".  Patient also briefly explored her mistrust of others and actively participated in a visualization activity involving trust and support.  Patient stated her spiritual beliefs and her feeling that she can trust God with everything except for her heart.  Severity of problem: Depression-Mild      Anxiety-  Severe  OBJECTIVE: Mood: Anxious and Affect: within range Risk of harm to self or others: No plan to harm self or others  Thought process: tangential Thought content: logical  LIFE CONTEXT: Family and Social: Patient is the oldest sibling and has two sisters and one brother.  Patient's father currently lives in [place].  Patient reported feeling like her grandparents house was home and that the majority of the values and morals were taught by her grandparents, however her mother was present during her upbringing.  ASSESSMENT: Patient is currently experiencing anxiety and lack of trust and may benefit from continued medication management and behavioral health services.  GOALS ADDRESSED: Patient will: 1.  Reduce symptoms of: anxiety  2.  Increase knowledge and/or ability of: coping skills, healthy habits and stress reduction  3.  Demonstrate ability to: Increase healthy adjustment to current life circumstances and Increase adequate support systems for patient/family  INTERVENTIONS: Interventions utilized:  Mindfulness or Relaxation Training and Supportive Counseling  Participated in visualization activity regarding trust.  Participated in a muscle relaxation exercise. Standardized Assessments completed: GAD-7 and PHQ 9   Depression screen PHQ 2/9 11/17/2016  Decreased Interest 0  Down, Depressed, Hopeless 1  PHQ - 2 Score 1  Altered sleeping 1  Tired, decreased energy 1  Change in appetite 2  Feeling bad or failure about yourself  1  Trouble concentrating 1  Moving slowly or fidgety/restless 1  Suicidal thoughts 0  PHQ-9 Score 8  Difficult doing work/chores Somewhat difficult    GAD 7 : Generalized Anxiety Score 11/17/2016  Nervous, Anxious, on Edge 3  Control/stop worrying 3  Worry too much - different things 3  Trouble relaxing 3  Restless 3  Easily annoyed  or irritable 3  Afraid - awful might happen 1  Total GAD 7 Score 19  Anxiety Difficulty Extremely difficult    PLAN: 1. Follow up with behavioral health clinician on : Tues Oct 16th at 8:45 am 2. Behavioral recommendations: Use learned muscle relaxation techniques as needed to reduce physical manifestation of anxiety in the body.  Write letter regarding lack of trust in others.   Sande Rives, [facility]

## 2016-11-25 ENCOUNTER — Ambulatory Visit (INDEPENDENT_AMBULATORY_CARE_PROVIDER_SITE_OTHER): Payer: BC Managed Care – PPO | Admitting: Licensed Clinical Social Worker

## 2016-11-25 DIAGNOSIS — F320 Major depressive disorder, single episode, mild: Secondary | ICD-10-CM

## 2016-11-25 DIAGNOSIS — F411 Generalized anxiety disorder: Secondary | ICD-10-CM
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
## 2016-12-02 ENCOUNTER — Ambulatory Visit (INDEPENDENT_AMBULATORY_CARE_PROVIDER_SITE_OTHER): Payer: BC Managed Care – PPO | Admitting: Licensed Clinical Social Worker

## 2016-12-02 DIAGNOSIS — F320 Major depressive disorder, single episode, mild: Secondary | ICD-10-CM

## 2016-12-02 DIAGNOSIS — F411 Generalized anxiety disorder: Secondary | ICD-10-CM
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
## 2016-12-09 ENCOUNTER — Ambulatory Visit (INDEPENDENT_AMBULATORY_CARE_PROVIDER_SITE_OTHER): Payer: BC Managed Care – PPO | Admitting: Licensed Clinical Social Worker

## 2016-12-09 ENCOUNTER — Other Ambulatory Visit: Payer: Self-pay

## 2016-12-09 DIAGNOSIS — F4322 Adjustment disorder with anxiety: Secondary | ICD-10-CM

## 2016-12-09 DIAGNOSIS — F411 Generalized anxiety disorder: Secondary | ICD-10-CM

## 2016-12-09 DIAGNOSIS — F320 Major depressive disorder, single episode, mild: Secondary | ICD-10-CM

## 2016-12-09 MED ORDER — ESCITALOPRAM OXALATE 20 MG PO TABS: 20.0000 mg | ORAL_TABLET | Freq: Every day | ORAL | 3 refills | Status: DC

## 2016-12-09 NOTE — Telephone Encounter (Signed)
Please send new dose of Lexapro to [facility] on [address].   Dose changed to Lexapro 20 mgs. New script needed./ Medication sent to pharmacy   Laverle Patter, RN

## 2016-12-09 NOTE — BH Specialist Note (Signed)
Integrated Behavioral Health Follow Up Visit  MRN: [id number] Name: Jacqueline Dawson  Number of [place] Clinician visits: 5/6 Session Start time: 8:54 am  Session End time: 9:48 am Total time: 55 minutes  Type of Service: [place] Interpretor:No. Interpretor Name and Language: N/A  SUBJECTIVE: Jacqueline Dawson is a 41 y.o. female accompanied by self Patient was referred by Dr. Johnnye Sima for accumulated life stressors.  Patient was willing to explore her sustained talk statements and ambivalence between her actions and her thoughts regarding her relationship.  Patient evaluated her communication interactions with her boyfriend, acceptance of his flaws, and her lack of patience while waiting for him to increase his romantic gestures and statements.  Patient also processed her lack of trust in the relationship and insecure attachment leading to spying.  Patient was engaged while discussing principles of a handout on Breaking the Cycle of Negative Self-Talk and was able to apply to her current situations. Patient was also open to reviewing natural consequences in her relationship and was receptive to using possible consequences as a motivator to avoid undesired consequences.  [facility] addressed termination again and reminded patient that next session will be the last session.  Patient will continue behavioral health services with Marcie Bal in-house.  Severity of problem: Depression- Mild    Anxiety-Severe  OBJECTIVE: Mood: Euthymic and Affect: Appropriate Risk of harm to self or others: No plan to harm self or others  Thought process: tangential Thought content: Logical  ASSESSMENT: Patient currently presents with less depressed mood and more insight into how negative thoughts and insecurities are fueling worry and anxiety. Depressed symptoms are showing improvement, however anxiety symptoms and negative self-talk are unchanged.  Patient may  benefit from continued behavioral health services and medication management.  GOALS ADDRESSED: Patient will: 1.  Reduce symptoms of: anxiety and depression  2.  Increase knowledge and/or ability of: coping skills, healthy habits and stress reduction  3.  Demonstrate ability to: Increase healthy adjustment to current life circumstances, Increase adequate support systems for patient/family and challenge faulty thinking errors  INTERVENTIONS: Interventions utilized:  Motivational Interviewing  PLAN: 1. Follow up with behavioral health clinician on : Tues Nov 6th at 8:45 am Last session 2. Behavioral recommendations: Continue reading the handout on Breaking the Cycle of Negative Self-Talk and complete the activity section 3. "From scale of 1-10, how likely are you to follow plan?": [place], [facility]

## 2016-12-16 ENCOUNTER — Ambulatory Visit (INDEPENDENT_AMBULATORY_CARE_PROVIDER_SITE_OTHER): Payer: BC Managed Care – PPO | Admitting: Licensed Clinical Social Worker

## 2016-12-16 DIAGNOSIS — F320 Major depressive disorder, single episode, mild: Secondary | ICD-10-CM

## 2016-12-16 DIAGNOSIS — F411 Generalized anxiety disorder: Secondary | ICD-10-CM

## 2016-12-16 NOTE — BH Specialist Note (Signed)
Integrated Behavioral Health Follow Up Visit  MRN: [id number] Name: Jacqueline Dawson  Number of [place] Clinician visits: 6/6 Session Start time: 8:54 am  Session End time: 9:50 am Total time: 1 hour  Type of Service: [place] Interpretor:No. Interpretor Name and Language: N/A  SUBJECTIVE: Jacqueline Dawson is a 41 y.o. female accompanied by self Patient was referred by Dr. Johnnye Sima for accumulated life stressors.  Patient reflected on her personal growth since receiving behavioral health services and was able to evaluate her use of cognitive distortions in her automatic thoughts.  Patient presented as still struggling with being judgmental and punitive of her thought process and was receptive of using rational responses.  Patient was receptive to using reframing to view her experiences differently and was able to reframe a negative thought about her relationship.  Patient reported that Lexapro medication has been helpful in reducing the "brain fog" of recurrent racing thoughts.  Patient reported mood as "excited" and "happy".  Severity of problem: Depression-Mild   Anxiety-Moderate  OBJECTIVE: Mood: happy and Affect: Appropriate Risk of harm to self or others: No plan to harm self or others  Thought process: Tangential Thought content: Logical  ASSESSMENT: Patient is continuing to present with less depressed mood and with more insight into how negative thinking contributes to worry and anxiety.  Depressed symptoms are showing marked improvement and anxiety symptoms have shown some improvement since the last session with a self-reported reduction of anxiety and racing thoughts.  Overall patient presents with more positive thinking and positive outcomes and reported increased life and relationship satisfaction.  Patient may benefit from continued behavioral health services and medication management.   GOALS ADDRESSED: Patient  will: 1.  Reduce symptoms of: anxiety and depression  2.  Increase knowledge and/or ability of: coping skills, healthy habits and stress reduction  3.  Demonstrate ability to: Increase healthy adjustment to current life circumstances, Increase adequate support systems for patient/family and and challenge faulty thinking errors  INTERVENTIONS: Interventions utilized:  Motivational Interviewing and Brief CBT  PLAN: 1. Follow up with behavioral health clinician on : Patient will not continue behavioral health services with [facility].  Instead patient has made appointment with in-house counselor Jessica Priest for Nov 8th at 12 pm to continue services longer term. 2. Behavioral recommendations: Patient will continue to use Cognitive Log to track automatic thoughts and challenge faulty thinking errors. 3. Referral(s): Integrated [employer] (In Clinic) Jessica Priest   [place], [place]

## 2016-12-18 ENCOUNTER — Ambulatory Visit: Payer: BC Managed Care – PPO

## 2016-12-25 ENCOUNTER — Ambulatory Visit: Payer: BC Managed Care – PPO

## 2017-01-08 ENCOUNTER — Telehealth: Payer: Self-pay

## 2017-01-08 ENCOUNTER — Ambulatory Visit (INDEPENDENT_AMBULATORY_CARE_PROVIDER_SITE_OTHER): Payer: BC Managed Care – PPO

## 2017-01-08 ENCOUNTER — Ambulatory Visit: Payer: BC Managed Care – PPO

## 2017-01-08 DIAGNOSIS — F411 Generalized anxiety disorder: Secondary | ICD-10-CM | POA: Diagnosis not present

## 2017-01-08 NOTE — BH Specialist Note (Signed)
Integrated Behavioral Health Follow Up Visit  MRN: [id number] Name: Jacqueline Dawson  Number of [place] Clinician visits: 9 Session Start time: 9:00  Session End time: 9:50  Total time: 50 minutes  Type of Service: [place] Interpretor:No. Interpretor Name and Language: N/A  SUBJECTIVE: Jacqueline Dawson is a 41 y.o. female accompanied by SELF Patient was referred by Dr. Johnnye Sima  for Anxiety. Patient reports the following symptoms/concerns: Patient shared with clinician that she is struggling with anxiety related to her current relationship.  Patient reports that she is realizing that this relationship is ending and has been unhealthy for her,  Therapist and patient discussed unhealthy relationship patterns and how they may impact overall mood and life satisfaction. Therapist provided supportive listening and cognitive reframing to help patient challenge negative thought patterns.  Duration of problem: 8-12 Months ; Severity of problem: mild  OBJECTIVE: Mood: Anxious and Affect: Tearful Risk of harm to self or others: No plan to harm self or others   GOALS ADDRESSED: Patient will: 1.  Reduce symptoms of: anxiety  2.  Increase knowledge and/or ability of: coping skills and healthy boundaries  3.  Demonstrate ability to: Increase healthy adjustment to current life circumstances  INTERVENTIONS: Interventions utilized:  Brief CBT  ASSESSMENT: Patient currently experiencing feelings of anxiety related to what she perceives as a "failure" but was open to cognitive reframing..   Patient may benefit from continued therapy.  PLAN: 1. Follow up with behavioral health clinician on : 01/15/17 at 9:00am. 2. Behavioral recommendations: client agreed to start journaling over the next week 3. "From scale of 1-10, how likely are you to follow plan?": [place], LCSW

## 2017-01-08 NOTE — Telephone Encounter (Signed)
Training with new staff only.

## 2017-01-15 ENCOUNTER — Ambulatory Visit: Payer: BC Managed Care – PPO

## 2017-01-22 ENCOUNTER — Ambulatory Visit: Payer: BC Managed Care – PPO

## 2017-01-29 ENCOUNTER — Ambulatory Visit: Payer: BC Managed Care – PPO

## 2017-02-05 ENCOUNTER — Ambulatory Visit: Payer: BC Managed Care – PPO

## 2017-02-12 ENCOUNTER — Ambulatory Visit: Payer: BC Managed Care – PPO

## 2017-02-26 ENCOUNTER — Ambulatory Visit: Payer: BC Managed Care – PPO

## 2017-02-26 DIAGNOSIS — F411 Generalized anxiety disorder: Secondary | ICD-10-CM

## 2017-03-02 ENCOUNTER — Other Ambulatory Visit: Payer: Self-pay

## 2017-03-02 DIAGNOSIS — F4322 Adjustment disorder with anxiety: Secondary | ICD-10-CM

## 2017-03-02 MED ORDER — ESCITALOPRAM OXALATE 20 MG PO TABS: 20.0000 mg | ORAL_TABLET | Freq: Every day | ORAL | 3 refills | Status: DC

## 2017-03-05 ENCOUNTER — Other Ambulatory Visit: Payer: Self-pay | Admitting: *Deleted

## 2017-03-05 ENCOUNTER — Ambulatory Visit: Payer: BC Managed Care – PPO

## 2017-03-05 DIAGNOSIS — F4322 Adjustment disorder with anxiety: Secondary | ICD-10-CM

## 2017-03-05 MED ORDER — ESCITALOPRAM OXALATE 20 MG PO TABS: 20.0000 mg | ORAL_TABLET | Freq: Every day | ORAL | 3 refills | Status: DC

## 2017-03-05 NOTE — Progress Notes (Signed)
Received notice that e-scribe failed. RN resent prescription 1/24 to [facility]. Attempted to notify patient, left message. Landis Gandy, RN

## 2017-03-12 ENCOUNTER — Ambulatory Visit: Payer: BC Managed Care – PPO

## 2017-04-09 ENCOUNTER — Ambulatory Visit: Payer: BC Managed Care – PPO

## 2017-04-16 ENCOUNTER — Ambulatory Visit: Payer: BC Managed Care – PPO

## 2017-04-17 ENCOUNTER — Telehealth: Payer: Self-pay | Admitting: *Deleted

## 2017-04-17 NOTE — Telephone Encounter (Signed)
Patient sees Jessica Priest weekly for counseling at [facility].  With Navia's knowledge and permission, Marcie Bal shared that Teyana is not taking her Perlie Gold, is actually unsure of when she last took it.  Per Ginette Pitman reports nausea, difficulty swallowing Triumeq.  Marcie Bal feels this is due to more than simply the size of the pill, but is concerned that Kaity has been off medication.   RN spoke with Cassie, asked if she would see the patient at the same time she is scheduled for labs on 3/13 at 9am.  OK per Cassie. RN left message scheduling [place] with Cassie 3/13 at 9:00. Landis Gandy, RN

## 2017-04-21 ENCOUNTER — Other Ambulatory Visit: Payer: Self-pay | Admitting: Obstetrics and Gynecology

## 2017-04-21 DIAGNOSIS — E049 Nontoxic goiter, unspecified: Secondary | ICD-10-CM

## 2017-04-22 ENCOUNTER — Other Ambulatory Visit: Payer: BC Managed Care – PPO

## 2017-04-22 ENCOUNTER — Ambulatory Visit (INDEPENDENT_AMBULATORY_CARE_PROVIDER_SITE_OTHER): Payer: BC Managed Care – PPO | Admitting: Pharmacist

## 2017-04-22 DIAGNOSIS — B20 Human immunodeficiency virus [HIV] disease: Secondary | ICD-10-CM | POA: Diagnosis not present

## 2017-04-22 DIAGNOSIS — Z91199 Patient's noncompliance with other medical treatment and regimen due to unspecified reason: Secondary | ICD-10-CM

## 2017-04-22 DIAGNOSIS — Z9119 Patient's noncompliance with other medical treatment and regimen: Secondary | ICD-10-CM

## 2017-04-22 MED ORDER — BICTEGRAVIR-EMTRICITAB-TENOFOV 50-200-25 MG PO TABS: 1.0000 | ORAL_TABLET | Freq: Every day | ORAL | 11 refills | Status: DC

## 2017-04-22 MED FILL — BIKTARVY 50-200-25 MG TABS: 50-200-25 | 30 days supply | Qty: 30 | Fill #0

## 2017-04-22 NOTE — Progress Notes (Signed)
[place] for Infectious Disease Pharmacy Visit  HPI: Jacqueline Dawson is a 42 y.o. female who presents to the [facility] clinic for HIV follow-up.   Patient Active Problem List   Diagnosis Date Noted  . Adjustment disorder with anxious mood 01/15/2016  . Vaginal discharge 08/02/2013  . Insomnia 10/09/2010  . ECTOPIC PREGNANCY 04/02/2007  . Human immunodeficiency virus (HIV) disease ([place]) 11/11/2005  . Depression 11/11/2005  . ABFND PAP SMEAR HGSIL 11/11/2005    Patient's Medications  New Prescriptions   BICTEGRAVIR-EMTRICITABINE-TENOFOVIR AF (BIKTARVY) 50-200-25 MG TABS TABLET    Take 1 tablet by mouth daily.  Previous Medications   CHOLECALCIFEROL (VITAMIN D) 1000 UNITS TABLET    Take 1,000 Units by mouth daily.   DRONABINOL (MARINOL) 5 MG CAPSULE    TAKE 1 CAPSULE BY MOUTH EVERY DAY BEFORE LUNCH   ESCITALOPRAM (LEXAPRO) 20 MG TABLET    Take 1 tablet (20 mg total) by mouth daily.   MULTIPLE VITAMINS-MINERALS (MULTIVITAMINS THER. W/MINERALS) TABS    Take 1 tablet by mouth daily.     NAPROXEN SODIUM (ANAPROX DS) 550 MG TABLET    1  po  pc  bid x 5 days then prn-pain  Modified Medications   No medications on file  Discontinued Medications   ABACAVIR-DOLUTEGRAVIR-LAMIVUDINE (TRIUMEQ) 600-50-300 MG TABLET    TAKE ONE TABLET BY MOUTH ONCE DAILY. STORE IN ORIGINAL BOTTLE AT [facility].   BIOTIN PO    Take 1 tablet by mouth daily.    Allergies: Allergies  Allergen Reactions  . Sulfonamide Derivatives Rash    Past Medical History: Past Medical History:  Diagnosis Date  . Anxiety   . HIV positive ([place])     Social History: Social History   Socioeconomic History  . Marital status: Divorced    Spouse name: Not on file  . Number of children: Not on file  . Years of education: Not on file  . Highest education level: Not on file  Social Needs  . Financial resource strain: Not on file  . Food insecurity - worry: Not on file  . Food insecurity - inability: Not on  file  . Transportation needs - medical: Not on file  . Transportation needs - non-medical: Not on file  Occupational History  . Not on file  Tobacco Use  . Smoking status: Former Smoker    Packs/day: 0.30    Years: 16.00    Pack years: 4.80    Types: Cigarettes    Last attempt to quit: 01/28/2011    Years since quitting: 6.2  . Smokeless tobacco: Never Used  . Tobacco comment: congratulated!  Substance and Sexual Activity  . Alcohol use: Yes    Alcohol/week: 4.2 oz    Types: 7 Glasses of wine per week    Comment: occasional  . Drug use: Yes    Frequency: 2.0 times per week    Types: Marijuana    Comment: 12/12/15  . Sexual activity: Not Currently    Partners: Male    Birth control/protection: Surgical    Comment: pt. declined condoms  Other Topics Concern  . Not on file  Social History Narrative  . Not on file    Labs: HIV 1 RNA Quant (copies/mL)  Date Value  07/16/2016 <20 DETECTED (A)  01/14/2016 46 (H)  07/11/2015 <20   CD4 T Cell Abs (/uL)  Date Value  07/16/2016 340 (L)  01/14/2016 310 (L)  07/11/2015 370 (L)   Hep B S Ab (no units)  Date Value  05/11/2013 NEG   Hepatitis B Surface Ag (no units)  Date Value  04/06/2006 NO   HCV Ab (no units)  Date Value  04/06/2006 NO    Lipids:    Component Value Date/Time   CHOL 163 07/16/2016 1030   TRIG 60 07/16/2016 1030   HDL 87 07/16/2016 1030   CHOLHDL 1.9 07/16/2016 1030   VLDL 12 07/16/2016 1030   LDLCALC 64 07/16/2016 1030    Current HIV Regimen: Triumeq  Assessment: [name] is here today to follow-up with me for her HIV infection.  She last saw Dr. Johnnye Sima in September 2018 and was doing well on Triumeq (started Triumeq in 2015). She sees our counselor, Marcie Bal, every Thursday and last Thursday she admitted that she is not taking her Triumeq like she is supposed to, so she was placed on my schedule.  She tells me today that she was taking her Triumeq every day but lately (the last month or so)  she has only been taking it on the week days but not all weekends and misses some days during the week.  She states the Triumeq is very hard for her to swallow and it causes a lot of nausea.  She wishes to change medications.  She also told me that her OB/GYN told her that her thyroid was swollen and she will be getting an ultrasound on it soon. I explained the problems associated with taking her medications every other day and skipping weekends. Explained how resistance develops and that if she ever had trouble in the future to stop completely and call us ASAP.  I assume she is getting the nausea from the abacavir component of the Triumeq.    I could not find any resistance in her chart and her viral load is usually undetectable, so I will switch her to [place] today. She is extremely excited about the size of the pill. Explained to her how to take it and possible side effects.  Also gave her my card and told her to call me ASAP with any issues. She comes to clinic weekly to see Marcie Bal so she can check in with me if needed when she is here. We are able to fill at [facility], so I sent her Rx over there and she will pick it up today. I will get a HIV viral load today with reflex to ultraquant as I'm scared she may have developed resistance. Hopefully [name] will still be ok. She has a follow-up with Dr. Johnnye Sima in a few weeks.   Plan: - Stop Triumeq - Start Biktarvy PO once daily - HIV RNA w reflex to ultraquant, CD4, CBC, CMET today - F/u with Dr. Johnnye Sima 3/27 at 845am  Etana Beets L. Gerad Cornelio, PharmD, [place], [place] for Infectious Disease 04/22/2017, 10:22 AM

## 2017-04-23 ENCOUNTER — Ambulatory Visit: Payer: BC Managed Care – PPO

## 2017-04-23 LAB — CBC
HCT: 37.2 % (ref 35.0–45.0)
Hemoglobin: 12.6 g/dL (ref 11.7–15.5)
MCH: 28.3 pg (ref 27.0–33.0)
MCHC: 33.9 g/dL (ref 32.0–36.0)
MCV: 83.4 fL (ref 80.0–100.0)
MPV: 10.5 fL (ref 7.5–12.5)
PLATELETS: 222 10*3/uL (ref 140–400)
RBC: 4.46 10*6/uL (ref 3.80–5.10)
RDW: 12.1 % (ref 11.0–15.0)
WBC: 4.8 10*3/uL (ref 3.8–10.8)

## 2017-04-23 LAB — COMPREHENSIVE METABOLIC PANEL
AG Ratio: 1.4 (calc) (ref 1.0–2.5)
ALT: 21 U/L (ref 6–29)
AST: 23 U/L (ref 10–30)
Albumin: 4.1 g/dL (ref 3.6–5.1)
Alkaline phosphatase (APISO): 47 U/L (ref 33–115)
BUN: 10 mg/dL (ref 7–25)
CO2: 26 mmol/L (ref 20–32)
CREATININE: 0.84 mg/dL (ref 0.50–1.10)
Calcium: 8.9 mg/dL (ref 8.6–10.2)
Chloride: 106 mmol/L (ref 98–110)
GLUCOSE: 64 mg/dL — AB (ref 65–99)
Globulin: 3 g/dL (calc) (ref 1.9–3.7)
Potassium: 4.3 mmol/L (ref 3.5–5.3)
Sodium: 139 mmol/L (ref 135–146)
Total Bilirubin: 0.2 mg/dL (ref 0.2–1.2)
Total Protein: 7.1 g/dL (ref 6.1–8.1)

## 2017-04-23 LAB — T-HELPER CELL (CD4) - (RCID CLINIC ONLY)
CD4 % Helper T Cell: 20 % — ABNORMAL LOW (ref 33–55)
CD4 T CELL ABS: 420 /uL (ref 400–2700)

## 2017-04-27 ENCOUNTER — Ambulatory Visit
Admission: RE | Admit: 2017-04-27 | Discharge: 2017-04-27 | Disposition: A | Discharge status: Home / Self Care | Payer: BC Managed Care – PPO | Source: Ambulatory Visit | Attending: Obstetrics and Gynecology | Admitting: Obstetrics and Gynecology

## 2017-04-27 DIAGNOSIS — E049 Nontoxic goiter, unspecified: Secondary | ICD-10-CM

## 2017-04-29 LAB — HIV RNA, RTPCR W/R GT (RTI, PI,INT)
HIV 1 RNA QUANT: 36 {copies}/mL — AB
HIV-1 RNA QUANT, LOG: 1.56 {Log_copies}/mL — AB

## 2017-04-30 ENCOUNTER — Ambulatory Visit: Payer: BC Managed Care – PPO

## 2017-05-06 ENCOUNTER — Ambulatory Visit (INDEPENDENT_AMBULATORY_CARE_PROVIDER_SITE_OTHER): Payer: BC Managed Care – PPO | Admitting: Infectious Diseases

## 2017-05-06 ENCOUNTER — Encounter: Payer: Self-pay | Admitting: Infectious Diseases

## 2017-05-06 VITALS — BP 146/94 | HR 80 | Temp 98.4°F | Wt 141.0 lb

## 2017-05-06 DIAGNOSIS — G4700 Insomnia, unspecified: Secondary | ICD-10-CM | POA: Diagnosis not present

## 2017-05-06 DIAGNOSIS — Z23 Encounter for immunization: Secondary | ICD-10-CM

## 2017-05-06 DIAGNOSIS — Z113 Encounter for screening for infections with a predominantly sexual mode of transmission: Secondary | ICD-10-CM

## 2017-05-06 DIAGNOSIS — B20 Human immunodeficiency virus [HIV] disease: Secondary | ICD-10-CM

## 2017-05-06 DIAGNOSIS — F324 Major depressive disorder, single episode, in partial remission: Secondary | ICD-10-CM

## 2017-05-06 DIAGNOSIS — R87613 High grade squamous intraepithelial lesion on cytologic smear of cervix (HGSIL): Secondary | ICD-10-CM

## 2017-05-06 DIAGNOSIS — Z79899 Other long term (current) drug therapy: Secondary | ICD-10-CM

## 2017-05-06 NOTE — Assessment & Plan Note (Signed)
States she wasn't taking her medicine the way she was supposed to.  She promises labs will be better at next visit.  Offered/refused condoms.  Give tet vax Return to clinic 9 months.

## 2017-05-06 NOTE — Assessment & Plan Note (Signed)
Appreciate f/u with Dr Mancel Bale.  Last result negative per pt.

## 2017-05-06 NOTE — Assessment & Plan Note (Signed)
Appreciate psych f/u.  Will defer rx at this point.

## 2017-05-06 NOTE — Addendum Note (Signed)
Addended by: Aundria Rud on: 05/06/2017 09:35 AM   Modules accepted: Orders
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
## 2017-05-06 NOTE — Assessment & Plan Note (Signed)
She is doing ok  Appreciate psych f/u.

## 2017-05-07 ENCOUNTER — Ambulatory Visit: Payer: BC Managed Care – PPO

## 2017-05-14 ENCOUNTER — Ambulatory Visit: Payer: BC Managed Care – PPO

## 2017-05-18 ENCOUNTER — Telehealth: Payer: Self-pay | Admitting: Pharmacist

## 2017-05-18 ENCOUNTER — Other Ambulatory Visit: Payer: Self-pay | Admitting: Pharmacist

## 2017-05-18 DIAGNOSIS — B20 Human immunodeficiency virus [HIV] disease: Secondary | ICD-10-CM

## 2017-05-18 MED ORDER — BICTEGRAVIR-EMTRICITAB-TENOFOV 50-200-25 MG PO TABS: 1.0000 | ORAL_TABLET | Freq: Every day | ORAL | 11 refills | Status: DC

## 2017-05-18 NOTE — Telephone Encounter (Signed)
Jacqueline Dawson's insurance is requiring her to fill her Biktarvy at [facility].  I called to let her know. She previously filled through them. [facility] from [place] [facility] and she [facility]. She will call tomorrow to set it up.

## 2017-05-21 ENCOUNTER — Ambulatory Visit: Payer: BC Managed Care – PPO

## 2017-05-28 ENCOUNTER — Ambulatory Visit: Payer: BC Managed Care – PPO

## 2017-06-04 ENCOUNTER — Ambulatory Visit: Payer: BC Managed Care – PPO

## 2017-06-11 ENCOUNTER — Ambulatory Visit: Payer: BC Managed Care – PPO

## 2017-06-18 ENCOUNTER — Ambulatory Visit: Payer: BC Managed Care – PPO

## 2017-06-25 ENCOUNTER — Ambulatory Visit: Payer: BC Managed Care – PPO

## 2017-07-02 ENCOUNTER — Ambulatory Visit: Payer: BC Managed Care – PPO

## 2017-07-09 ENCOUNTER — Ambulatory Visit: Payer: BC Managed Care – PPO

## 2017-07-16 ENCOUNTER — Ambulatory Visit: Payer: BC Managed Care – PPO

## 2017-07-23 ENCOUNTER — Ambulatory Visit: Payer: BC Managed Care – PPO

## 2017-07-30 ENCOUNTER — Ambulatory Visit: Payer: BC Managed Care – PPO

## 2017-08-06 ENCOUNTER — Ambulatory Visit: Payer: BC Managed Care – PPO

## 2017-08-13 ENCOUNTER — Ambulatory Visit: Payer: BC Managed Care – PPO

## 2017-08-20 ENCOUNTER — Ambulatory Visit: Payer: BC Managed Care – PPO

## 2017-08-27 ENCOUNTER — Ambulatory Visit: Payer: BC Managed Care – PPO

## 2017-09-03 ENCOUNTER — Ambulatory Visit: Payer: BC Managed Care – PPO

## 2017-09-10 ENCOUNTER — Ambulatory Visit: Payer: BC Managed Care – PPO

## 2017-09-17 ENCOUNTER — Ambulatory Visit: Payer: BC Managed Care – PPO

## 2017-09-24 ENCOUNTER — Ambulatory Visit: Payer: BC Managed Care – PPO

## 2017-10-01 ENCOUNTER — Ambulatory Visit: Payer: BC Managed Care – PPO

## 2017-10-01 NOTE — Progress Notes (Unsigned)
Integrated Behavioral Health Follow Up Visit  MRN: [id number] Name: Jacqueline Dawson  Session Start time: 9:00 Session End time: 10:00 Total time: 1 hour  Type of Service: Integrated Behavioral Health- Individual/Family   SUBJECTIVE: Jacqueline Dawson is a 42 y.o. female presenting to the [facility] for outpatient mental health counseling  OBJECTIVE: Mood: Arert, oriented x4, with no SI, HI, or symptoms of psychosis (risk low).     GOALS ADDRESSED: Patient will: 1.  Reduce symptoms of: anxiety  2.  Increase knowledge and/or ability of: developing improved self esteem and self-worth. 3.  Demonstrate ability to: set and maintain appropriate and healthy boundaries in her interpersonal relationships.  INTERVENTIONS: Interventions utilized: Therapist provided outpatient mental health individual therapy to include ongoing assessment, support, and reinforcement. Therapist provided client with supportive listening and used cognitive reframing exercise to help client identity negative thought patterns.    ASSESSMENT: Patient currently experiencing increased stress related to her new relationship and reported how this impacts her overall feeling of well-being.  Discussion focused on [place] learning to find feelings of well-being on her own, rather than waiting for someone else to provide this for her.  Therapist encouraged client to stop stating what "should or shouldn't" happen in her life according to how she "should or should not" feel, behave, etc.    Patient may benefit from continuing to understand that she is responsible for her own emotional well-being.Marland Kitchen  PLAN: 1. Follow up with behavioral health clinician on : 10/08/17.   Hughes Better, LCSW

## 2017-10-08 ENCOUNTER — Ambulatory Visit: Payer: BC Managed Care – PPO

## 2017-10-15 ENCOUNTER — Ambulatory Visit: Payer: BC Managed Care – PPO

## 2017-10-22 ENCOUNTER — Ambulatory Visit: Payer: BC Managed Care – PPO

## 2017-10-29 ENCOUNTER — Ambulatory Visit: Payer: BC Managed Care – PPO

## 2017-11-05 ENCOUNTER — Ambulatory Visit: Payer: BC Managed Care – PPO

## 2017-11-12 ENCOUNTER — Ambulatory Visit: Payer: BC Managed Care – PPO

## 2017-11-19 ENCOUNTER — Ambulatory Visit: Payer: BC Managed Care – PPO

## 2017-11-26 ENCOUNTER — Ambulatory Visit: Payer: BC Managed Care – PPO

## 2017-12-03 ENCOUNTER — Ambulatory Visit: Payer: BC Managed Care – PPO

## 2017-12-09 ENCOUNTER — Other Ambulatory Visit: Payer: Self-pay | Admitting: Obstetrics and Gynecology

## 2017-12-09 DIAGNOSIS — N6489 Other specified disorders of breast: Secondary | ICD-10-CM

## 2017-12-24 ENCOUNTER — Other Ambulatory Visit: Payer: BC Managed Care – PPO

## 2018-01-01 ENCOUNTER — Ambulatory Visit: Payer: BC Managed Care – PPO

## 2018-01-01 ENCOUNTER — Ambulatory Visit
Admission: RE | Admit: 2018-01-01 | Discharge: 2018-01-01 | Disposition: A | Discharge status: Home / Self Care | Payer: BC Managed Care – PPO | Source: Ambulatory Visit | Attending: Obstetrics and Gynecology | Admitting: Obstetrics and Gynecology

## 2018-01-01 DIAGNOSIS — N6489 Other specified disorders of breast: Secondary | ICD-10-CM

## 2018-01-05 ENCOUNTER — Other Ambulatory Visit: Payer: BC Managed Care – PPO

## 2018-01-12 ENCOUNTER — Telehealth: Payer: Self-pay | Admitting: *Deleted

## 2018-01-12 NOTE — Telephone Encounter (Signed)
Patient called to advise she is having what she thinks is a UTI. She is having strong smelling urine, back ache, pressure and urgency and not much urine output. She is coming to office for labs tomorrow 01/13/18 and would like for provider to add a urinalysis if possible. Advised will ask the provider and add the lab if he says it is ok to do so.

## 2018-01-13 ENCOUNTER — Other Ambulatory Visit (HOSPITAL_COMMUNITY)
Admission: RE | Admit: 2018-01-13 | Discharge: 2018-01-13 | Disposition: A | Discharge status: Home / Self Care | Payer: BC Managed Care – PPO | Source: Ambulatory Visit | Attending: Infectious Diseases | Admitting: Infectious Diseases

## 2018-01-13 ENCOUNTER — Other Ambulatory Visit: Payer: BC Managed Care – PPO

## 2018-01-13 DIAGNOSIS — R300 Dysuria: Secondary | ICD-10-CM

## 2018-01-13 DIAGNOSIS — Z79899 Other long term (current) drug therapy: Secondary | ICD-10-CM

## 2018-01-13 DIAGNOSIS — B20 Human immunodeficiency virus [HIV] disease: Secondary | ICD-10-CM

## 2018-01-13 DIAGNOSIS — Z113 Encounter for screening for infections with a predominantly sexual mode of transmission: Secondary | ICD-10-CM

## 2018-01-13 NOTE — Telephone Encounter (Signed)
UA and UCx fine, thank you!

## 2018-01-14 LAB — URINE CYTOLOGY ANCILLARY ONLY
Chlamydia: NEGATIVE
Neisseria Gonorrhea: NEGATIVE

## 2018-01-14 LAB — T-HELPER CELL (CD4) - (RCID CLINIC ONLY)
CD4 % Helper T Cell: 17 % — ABNORMAL LOW (ref 33–55)
CD4 T Cell Abs: 320 /uL — ABNORMAL LOW (ref 400–2700)

## 2018-01-14 NOTE — Telephone Encounter (Signed)
Patient called to check on her urine labs from 01/13/18. Advised her we are still waiting on the urine culture and once it is resulted we will call her with results and/or treatment. She [facility] [address]. Added it to her profile and advised will make sure it goes.

## 2018-01-15 ENCOUNTER — Other Ambulatory Visit: Payer: Self-pay | Admitting: *Deleted

## 2018-01-15 DIAGNOSIS — N390 Urinary tract infection, site not specified: Secondary | ICD-10-CM

## 2018-01-15 LAB — CBC
HEMATOCRIT: 38 % (ref 35.0–45.0)
Hemoglobin: 12.7 g/dL (ref 11.7–15.5)
MCH: 28.4 pg (ref 27.0–33.0)
MCHC: 33.4 g/dL (ref 32.0–36.0)
MCV: 85 fL (ref 80.0–100.0)
MPV: 10.8 fL (ref 7.5–12.5)
PLATELETS: 286 10*3/uL (ref 140–400)
RBC: 4.47 10*6/uL (ref 3.80–5.10)
RDW: 12.4 % (ref 11.0–15.0)
WBC: 5.8 10*3/uL (ref 3.8–10.8)

## 2018-01-15 LAB — LIPID PANEL
Cholesterol: 174 mg/dL (ref <200)
HDL: 51 mg/dL (ref >50)
LDL Cholesterol (Calc): 108 mg/dL (calc) — ABNORMAL HIGH
NON-HDL CHOLESTEROL (CALC): 123 mg/dL (ref <130)
Total CHOL/HDL Ratio: 3.4 (calc) (ref <5.0)
Triglycerides: 65 mg/dL (ref <150)

## 2018-01-15 LAB — RPR: RPR Ser Ql: NONREACTIVE

## 2018-01-15 LAB — URINALYSIS, ROUTINE W REFLEX MICROSCOPIC
BILIRUBIN URINE: NEGATIVE
Glucose, UA: NEGATIVE
Hyaline Cast: NONE SEEN /LPF
KETONES UR: NEGATIVE
NITRITE: NEGATIVE
PROTEIN: NEGATIVE
SPECIFIC GRAVITY, URINE: 1.012 (ref 1.001–1.03)
pH: 6.5 (ref 5.0–8.0)

## 2018-01-15 LAB — COMPREHENSIVE METABOLIC PANEL
AG RATIO: 1.6 (calc) (ref 1.0–2.5)
ALT: 10 U/L (ref 6–29)
AST: 17 U/L (ref 10–30)
Albumin: 4.4 g/dL (ref 3.6–5.1)
Alkaline phosphatase (APISO): 49 U/L (ref 33–115)
BUN: 11 mg/dL (ref 7–25)
CHLORIDE: 105 mmol/L (ref 98–110)
CO2: 29 mmol/L (ref 20–32)
Calcium: 9.9 mg/dL (ref 8.6–10.2)
Creat: 0.92 mg/dL (ref 0.50–1.10)
GLOBULIN: 2.7 g/dL (ref 1.9–3.7)
GLUCOSE: 85 mg/dL (ref 65–99)
Potassium: 4.6 mmol/L (ref 3.5–5.3)
SODIUM: 139 mmol/L (ref 135–146)
TOTAL PROTEIN: 7.1 g/dL (ref 6.1–8.1)
Total Bilirubin: 0.3 mg/dL (ref 0.2–1.2)

## 2018-01-15 LAB — URINE CULTURE
MICRO NUMBER:: 91453692
SPECIMEN QUALITY: ADEQUATE

## 2018-01-15 LAB — HIV-1 RNA QUANT-NO REFLEX-BLD
HIV 1 RNA Quant: 33 copies/mL — ABNORMAL HIGH
HIV-1 RNA Quant, Log: 1.52 Log copies/mL — ABNORMAL HIGH

## 2018-01-15 MED ORDER — LEVOFLOXACIN 500 MG PO TABS: 500.0000 mg | ORAL_TABLET | Freq: Every day | ORAL | 0 refills | Status: DC

## 2018-01-15 NOTE — Telephone Encounter (Signed)
Per verbal from Dr Johnnye Sima called patient to advise she has a UTI and Levaquin 500 was sent in to pharmacy 1 tab daily for 3 days. Left a message for her that the medication has been sent and call if any questions.

## 2018-01-21 ENCOUNTER — Ambulatory Visit: Payer: BC Managed Care – PPO

## 2018-01-27 ENCOUNTER — Encounter: Payer: Self-pay | Admitting: Infectious Diseases

## 2018-01-27 ENCOUNTER — Ambulatory Visit (INDEPENDENT_AMBULATORY_CARE_PROVIDER_SITE_OTHER): Payer: BC Managed Care – PPO | Admitting: Infectious Diseases

## 2018-01-27 VITALS — BP 147/89 | HR 106 | Temp 98.6°F | Wt 138.0 lb

## 2018-01-27 DIAGNOSIS — Z79899 Other long term (current) drug therapy: Secondary | ICD-10-CM

## 2018-01-27 DIAGNOSIS — Z113 Encounter for screening for infections with a predominantly sexual mode of transmission: Secondary | ICD-10-CM | POA: Diagnosis not present

## 2018-01-27 DIAGNOSIS — F324 Major depressive disorder, single episode, in partial remission: Secondary | ICD-10-CM

## 2018-01-27 DIAGNOSIS — Z23 Encounter for immunization: Secondary | ICD-10-CM | POA: Diagnosis not present

## 2018-01-27 DIAGNOSIS — B20 Human immunodeficiency virus [HIV] disease: Secondary | ICD-10-CM

## 2018-01-27 DIAGNOSIS — I10 Essential (primary) hypertension: Secondary | ICD-10-CM | POA: Insufficient documentation

## 2018-01-27 NOTE — Assessment & Plan Note (Signed)
She is doing well She is glad to be on biktarvy.  Flu and PCV 13 today Not sexually active.  mammo uptodate Will see her back in 6 months with labs prior.

## 2018-01-27 NOTE — Progress Notes (Signed)
   Subjective:    Patient ID: Jacqueline Dawson, female    DOB: 11-Sep-1975, 42 y.o.   MRN: [id number]  HPI 42yo F with hx of HIV+ and previous abn PAP/LEEP. 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 She had endometrial bx 05-2015 that was benign. 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  She had endometrial ablation on 01-25-16. Had f/u 04-2016, normal pap. Her last PAP was 04-2017 (nl).   Previously was changed from [place] to [place].  Complera recently due to persistent blips. HLA (-) 07-2013. Was then changed to triumeq 09-2013.  Then changed to biktarvy.  Woke up with headache which she attributes to stress. Has not eaten yet.    Has10and 71 yo kids. They are doing well.  Has a grandbaby now.  Works for [employer].   She had mammo at work 11-10-17 (nl).She then had diagnostic mammo due to "spot" that was (-) 12-2017.   HIV 1 RNA Quant (copies/mL)  Date Value  01/13/2018 33 (H)  04/22/2017 36 (H)  07/16/2016 <20 DETECTED (A)   CD4 T Cell Abs (/uL)  Date Value  01/13/2018 320 (L)  04/22/2017 420  07/16/2016 340 (L)   Review of Systems  Constitutional: Negative for appetite change and unexpected weight change.  Respiratory: Negative for cough and shortness of breath.   Gastrointestinal: Negative for constipation and diarrhea.  Genitourinary: Negative for difficulty urinating and menstrual problem.  amenorrhiec.  Please see HPI. All other systems reviewed and negative.     Objective:   Physical Exam Constitutional:      Appearance: Normal appearance. She is not ill-appearing.  HENT:     Head: Normocephalic.     Mouth/Throat:     Mouth: Mucous membranes are moist.     Pharynx: No oropharyngeal exudate.  Eyes:     Extraocular Movements: Extraocular movements intact.     Pupils: Pupils are equal, round, and reactive to light.  Neck:     Musculoskeletal: Normal range of motion and neck supple.  Cardiovascular:     Rate and Rhythm:  Normal rate and regular rhythm.  Pulmonary:     Effort: Pulmonary effort is normal.     Breath sounds: Normal breath sounds.  Abdominal:     General: Bowel sounds are normal.     Palpations: Abdomen is soft.     Tenderness: There is no abdominal tenderness.  Musculoskeletal:     Right lower leg: No edema.     Left lower leg: No edema.  Neurological:     General: No focal deficit present.     Mental Status: She is alert.  Psychiatric:        Mood and Affect: Mood normal.           Assessment & Plan:

## 2018-01-27 NOTE — Assessment & Plan Note (Signed)
BP  Is up today, will monitor at next visit and consider starting rx.

## 2018-01-27 NOTE — Addendum Note (Signed)
Addended by: Lenore Cordia on: 01/27/2018 11:40 AM   Modules accepted: Orders

## 2018-01-27 NOTE — Assessment & Plan Note (Signed)
Mood stable, doing well.  Has f/u with Marcie Bal

## 2018-03-11 ENCOUNTER — Ambulatory Visit: Payer: BC Managed Care – PPO

## 2018-03-18 ENCOUNTER — Ambulatory Visit: Payer: BC Managed Care – PPO

## 2018-03-25 ENCOUNTER — Ambulatory Visit: Payer: BC Managed Care – PPO

## 2018-04-01 ENCOUNTER — Ambulatory Visit: Payer: BC Managed Care – PPO

## 2018-04-08 ENCOUNTER — Ambulatory Visit: Payer: BC Managed Care – PPO

## 2018-04-15 ENCOUNTER — Ambulatory Visit: Payer: BC Managed Care – PPO

## 2018-04-22 ENCOUNTER — Ambulatory Visit: Payer: BC Managed Care – PPO

## 2018-05-31 ENCOUNTER — Other Ambulatory Visit: Payer: Self-pay | Admitting: Pharmacist

## 2018-05-31 DIAGNOSIS — B20 Human immunodeficiency virus [HIV] disease: Secondary | ICD-10-CM

## 2018-05-31 MED ORDER — BICTEGRAVIR-EMTRICITAB-TENOFOV 50-200-25 MG PO TABS: 1.0000 | ORAL_TABLET | Freq: Every day | ORAL | 11 refills | Status: DC

## 2018-06-16 ENCOUNTER — Other Ambulatory Visit: Payer: Self-pay | Admitting: Infectious Diseases

## 2018-06-16 DIAGNOSIS — N390 Urinary tract infection, site not specified: Secondary | ICD-10-CM

## 2018-07-29 ENCOUNTER — Telehealth: Payer: Self-pay | Admitting: *Deleted

## 2018-07-29 ENCOUNTER — Other Ambulatory Visit (HOSPITAL_COMMUNITY)
Admission: RE | Admit: 2018-07-29 | Discharge: 2018-07-29 | Disposition: A | Discharge status: Home / Self Care | Payer: BC Managed Care – PPO | Source: Ambulatory Visit | Attending: Infectious Diseases | Admitting: Infectious Diseases

## 2018-07-29 ENCOUNTER — Other Ambulatory Visit: Payer: Self-pay

## 2018-07-29 ENCOUNTER — Other Ambulatory Visit: Payer: BC Managed Care – PPO

## 2018-07-29 DIAGNOSIS — Z113 Encounter for screening for infections with a predominantly sexual mode of transmission: Secondary | ICD-10-CM | POA: Diagnosis not present

## 2018-07-29 DIAGNOSIS — B20 Human immunodeficiency virus [HIV] disease: Secondary | ICD-10-CM

## 2018-07-29 DIAGNOSIS — R5383 Other fatigue: Secondary | ICD-10-CM

## 2018-07-29 DIAGNOSIS — Z79899 Other long term (current) drug therapy: Secondary | ICD-10-CM

## 2018-07-29 NOTE — Telephone Encounter (Signed)
That would fine Thank you

## 2018-07-29 NOTE — Telephone Encounter (Signed)
Patient in for labs today and she advised that at last visit she and provider discussed adding a Vit D and Vit B12 to her labs as she was having trouble with fatigue at the time. Advised provider is out of the clinic but will send him a message.

## 2018-07-30 LAB — T-HELPER CELL (CD4) - (RCID CLINIC ONLY)
CD4 % Helper T Cell: 25 % — ABNORMAL LOW (ref 33–65)
CD4 T Cell Abs: 497 /uL (ref 400–1790)

## 2018-07-30 LAB — URINE CYTOLOGY ANCILLARY ONLY
Chlamydia: NEGATIVE
Neisseria Gonorrhea: NEGATIVE

## 2018-08-02 LAB — VITAMIN D 1,25 DIHYDROXY
Vitamin D 1, 25 (OH)2 Total: 75 pg/mL — ABNORMAL HIGH (ref 18–72)
Vitamin D2 1, 25 (OH)2: <8 pg/mL
Vitamin D3 1, 25 (OH)2: 75 pg/mL

## 2018-08-02 LAB — B12 AND FOLATE PANEL
Folate: 12.5 ng/mL
Vitamin B-12: 800 pg/mL (ref 200–1100)

## 2018-08-04 LAB — COMPREHENSIVE METABOLIC PANEL
AG Ratio: 1.7 (calc) (ref 1.0–2.5)
ALT: 9 U/L (ref 6–29)
AST: 17 U/L (ref 10–30)
Albumin: 4.8 g/dL (ref 3.6–5.1)
Alkaline phosphatase (APISO): 49 U/L (ref 31–125)
BUN: 10 mg/dL (ref 7–25)
CO2: 25 mmol/L (ref 20–32)
Calcium: 9.8 mg/dL (ref 8.6–10.2)
Chloride: 102 mmol/L (ref 98–110)
Creat: 0.91 mg/dL (ref 0.50–1.10)
Globulin: 2.9 g/dL (calc) (ref 1.9–3.7)
Glucose, Bld: 86 mg/dL (ref 65–99)
Potassium: 4.1 mmol/L (ref 3.5–5.3)
Sodium: 137 mmol/L (ref 135–146)
Total Bilirubin: 0.3 mg/dL (ref 0.2–1.2)
Total Protein: 7.7 g/dL (ref 6.1–8.1)

## 2018-08-04 LAB — LIPID PANEL
Cholesterol: 180 mg/dL (ref <200)
HDL: 60 mg/dL (ref >=50)
LDL Cholesterol (Calc): 104 mg/dL (calc) — ABNORMAL HIGH
Non-HDL Cholesterol (Calc): 120 mg/dL (calc) (ref <130)
Total CHOL/HDL Ratio: 3 (calc) (ref <5.0)
Triglycerides: 75 mg/dL (ref <150)

## 2018-08-04 LAB — CBC
HCT: 38.7 % (ref 35.0–45.0)
Hemoglobin: 13.1 g/dL (ref 11.7–15.5)
MCH: 28.6 pg (ref 27.0–33.0)
MCHC: 33.9 g/dL (ref 32.0–36.0)
MCV: 84.5 fL (ref 80.0–100.0)
MPV: 11.4 fL (ref 7.5–12.5)
Platelets: 231 10*3/uL (ref 140–400)
RBC: 4.58 10*6/uL (ref 3.80–5.10)
RDW: 12.5 % (ref 11.0–15.0)
WBC: 6.1 10*3/uL (ref 3.8–10.8)

## 2018-08-04 LAB — HIV-1 RNA QUANT-NO REFLEX-BLD
HIV 1 RNA Quant: 44 copies/mL — ABNORMAL HIGH
HIV-1 RNA Quant, Log: 1.64 Log copies/mL — ABNORMAL HIGH

## 2018-08-04 LAB — RPR: RPR Ser Ql: NONREACTIVE

## 2018-08-05 ENCOUNTER — Ambulatory Visit: Payer: BC Managed Care – PPO

## 2018-08-12 ENCOUNTER — Ambulatory Visit: Payer: BC Managed Care – PPO

## 2018-08-12 ENCOUNTER — Other Ambulatory Visit: Payer: Self-pay

## 2018-08-16 ENCOUNTER — Telehealth: Payer: Self-pay | Admitting: Infectious Diseases

## 2018-08-16 NOTE — Telephone Encounter (Signed)
COVID-19 Pre-Screening Questions:08/16/18 ° ° °Do you currently have a fever (>100 °F), chills or unexplained body aches? NO  ° °Are you currently experiencing new cough, shortness of breath, sore throat, runny nose?NO °•  °Have you recently travelled outside the state of [place] in the last 14 days? NO °•  °Have you been in contact with someone that is currently pending confirmation of Covid19 testing or has been confirmed to have the Covid19 virus?  NO ° °**If the patient answers NO to ALL questions -  advise the patient to please call the clinic before coming to the office should any symptoms develop.  ° ° °

## 2018-08-17 ENCOUNTER — Encounter: Payer: BC Managed Care – PPO | Admitting: Infectious Diseases

## 2018-08-26 ENCOUNTER — Ambulatory Visit: Payer: BC Managed Care – PPO

## 2018-08-26 ENCOUNTER — Other Ambulatory Visit: Payer: Self-pay

## 2018-08-26 ENCOUNTER — Ambulatory Visit (INDEPENDENT_AMBULATORY_CARE_PROVIDER_SITE_OTHER): Payer: BC Managed Care – PPO | Admitting: Infectious Diseases

## 2018-08-26 ENCOUNTER — Encounter: Payer: Self-pay | Admitting: Infectious Diseases

## 2018-08-26 VITALS — BP 122/87 | HR 88 | Temp 98.4°F | Wt 129.0 lb

## 2018-08-26 DIAGNOSIS — Z113 Encounter for screening for infections with a predominantly sexual mode of transmission: Secondary | ICD-10-CM

## 2018-08-26 DIAGNOSIS — R87613 High grade squamous intraepithelial lesion on cytologic smear of cervix (HGSIL): Secondary | ICD-10-CM | POA: Diagnosis not present

## 2018-08-26 DIAGNOSIS — I10 Essential (primary) hypertension: Secondary | ICD-10-CM

## 2018-08-26 DIAGNOSIS — F4322 Adjustment disorder with anxiety: Secondary | ICD-10-CM

## 2018-08-26 DIAGNOSIS — Z79899 Other long term (current) drug therapy: Secondary | ICD-10-CM

## 2018-08-26 DIAGNOSIS — B20 Human immunodeficiency virus [HIV] disease: Secondary | ICD-10-CM | POA: Diagnosis not present

## 2018-08-26 NOTE — Assessment & Plan Note (Signed)
She is doing ok Does not want to be "dependent on lexapro" Will restart as she desires.

## 2018-08-26 NOTE — Assessment & Plan Note (Signed)
BP up today, will repeat and continue to watch.

## 2018-08-26 NOTE — Assessment & Plan Note (Signed)
Being seen BIY  Doing well.

## 2018-08-26 NOTE — Progress Notes (Signed)
   Subjective:    Patient ID: Jacqueline Dawson, female    DOB: 1976/02/05, 43 y.o.   MRN: [id number]  HPI 42yo F with hx of HIV+ and previous abn PAP/LEEP. 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 She had endometrial bx 05-2015 that was benign. 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  She had endometrial ablation on 01-25-16. Had f/u 04-2016, normal pap.Her last PAP was 04-2017 (nl). (NL) 05-2018.   Previously was changed from [place] to [place].  Complera recently due to persistent blips. HLA (-) 07-2013. Was then changed to triumeq 09-2013.  Then changed to biktarvy ("I love it").    Has10and 22yo kids.They are doing well. Has a grandbaby now.  Works for [employer].   She had diagnostic mammo due to "spot" that was (-) 12-2017.  Next is October 2020.   Had B12 and D levels checked, normal. "I'm exhausted!". Takes OTC Vit D.   HIV 1 RNA Quant (copies/mL)  Date Value  07/29/2018 44 (H)  01/13/2018 33 (H)  04/22/2017 36 (H)   CD4 T Cell Abs (/uL)  Date Value  07/29/2018 497  01/13/2018 320 (L)  04/22/2017 420    Review of Systems  Constitutional: Negative for appetite change, chills, fever and unexpected weight change.  Respiratory: Negative for cough and shortness of breath.   Gastrointestinal: Negative for constipation and diarrhea.  Genitourinary: Negative for difficulty urinating.  Psychiatric/Behavioral: Positive for sleep disturbance.  anxiety keeps her awake. Does not want to take anything. Liked Lexapro but did not want to be dependent on it.  Would like to weigh 140.  Please see HPI. All other systems reviewed and negative.     Objective:   Physical Exam Constitutional:      Appearance: Normal appearance.  HENT:     Mouth/Throat:     Mouth: Mucous membranes are moist.     Pharynx: No oropharyngeal exudate.  Eyes:     Extraocular Movements: Extraocular movements intact.     Pupils: Pupils are equal, round, and  reactive to light.  Neck:     Musculoskeletal: Normal range of motion and neck supple.  Cardiovascular:     Rate and Rhythm: Normal rate and regular rhythm.  Pulmonary:     Effort: Pulmonary effort is normal.     Breath sounds: Normal breath sounds.  Abdominal:     General: Bowel sounds are normal. There is no distension.     Palpations: Abdomen is soft.     Tenderness: There is no abdominal tenderness.  Musculoskeletal:     Right lower leg: No edema.     Left lower leg: No edema.  Neurological:     General: No focal deficit present.     Mental Status: She is alert and oriented to person, place, and time.  Psychiatric:        Mood and Affect: Mood normal.       Assessment & Plan:

## 2018-08-26 NOTE — Assessment & Plan Note (Signed)
She is doing well, continued blips.  Offered/refused condoms.  vax are up to date.  rtc in 9 months with labs prior.  No change in meds Hold on restart marinol til BMI <20.

## 2018-09-02 ENCOUNTER — Ambulatory Visit: Payer: BC Managed Care – PPO

## 2019-03-17 ENCOUNTER — Telehealth: Payer: Self-pay

## 2019-03-17 NOTE — Telephone Encounter (Signed)
Patient called office today to find out if she should get covid vaccine. States her employer is offering vaccine next Thursday. Would like to make sure there will not be any interference regarding medication or diagnosis. Informed patient she should be fine getting vaccine regarding her diagnosis.  [place]

## 2019-03-24 ENCOUNTER — Ambulatory Visit: Payer: BC Managed Care – PPO | Attending: Family

## 2019-03-24 DIAGNOSIS — Z23 Encounter for immunization: Secondary | ICD-10-CM

## 2019-03-24 NOTE — Progress Notes (Signed)
   Covid-19 Vaccination Clinic  Name:  Jacqueline Dawson    MRN: [id number] DOB: [birth year]  03/24/2019  Jacqueline Dawson was observed post Covid-19 immunization for 15 minutes without incidence. She was provided with Vaccine Information Sheet and instruction to access the V-Safe system.   Jacqueline Dawson was instructed to call 911 with any severe reactions post vaccine: Marland Kitchen Difficulty breathing  . Swelling of your face and throat  . A fast heartbeat  . A bad rash all over your body  . Dizziness and weakness    Immunizations Administered    Name Date Dose VIS Date Route   Moderna COVID-19 Vaccine 03/24/2019  5:13 PM 0.5 mL 01/11/2019 Intramuscular   Manufacturer: Moderna   Lot: [id number]   [id number]

## 2019-04-21 ENCOUNTER — Ambulatory Visit: Payer: BC Managed Care – PPO | Attending: Family

## 2019-04-21 DIAGNOSIS — Z23 Encounter for immunization: Secondary | ICD-10-CM

## 2019-04-21 NOTE — Progress Notes (Signed)
   Covid-19 Vaccination Clinic  Name:  JOYCLYN KRAHL    MRN: [id number] DOB: 03/16/1975  04/21/2019  Ms. Harkness was observed post Covid-19 immunization for 15 minutes without incident. She was provided with Vaccine Information Sheet and instruction to access the V-Safe system.   Ms. [place]enburg was instructed to call 911 with any severe reactions post vaccine: Marland Kitchen Difficulty breathing  . Swelling of face and throat  . A fast heartbeat  . A bad rash all over body  . Dizziness and weakness   Immunizations Administered    Name Date Dose VIS Date Route   Moderna COVID-19 Vaccine 04/21/2019  3:14 PM 0.5 mL 01/11/2019 Intramuscular   Manufacturer: Moderna   Lot: [id number]   [id number]

## 2019-04-26 ENCOUNTER — Ambulatory Visit: Payer: BC Managed Care – PPO

## 2019-05-26 ENCOUNTER — Other Ambulatory Visit: Payer: BC Managed Care – PPO

## 2019-05-27 ENCOUNTER — Other Ambulatory Visit: Payer: BC Managed Care – PPO

## 2019-05-27 ENCOUNTER — Other Ambulatory Visit: Payer: Self-pay

## 2019-05-27 DIAGNOSIS — Z79899 Other long term (current) drug therapy: Secondary | ICD-10-CM

## 2019-05-27 DIAGNOSIS — B20 Human immunodeficiency virus [HIV] disease: Secondary | ICD-10-CM

## 2019-05-27 DIAGNOSIS — Z113 Encounter for screening for infections with a predominantly sexual mode of transmission: Secondary | ICD-10-CM

## 2019-05-27 LAB — T-HELPER CELL (CD4) - (RCID CLINIC ONLY)
CD4 % Helper T Cell: 23 % — ABNORMAL LOW (ref 33–65)
CD4 T Cell Abs: 523 /uL (ref 400–1790)

## 2019-06-01 LAB — LIPID PANEL
Cholesterol: 185 mg/dL (ref <200)
HDL: 76 mg/dL (ref >=50)
LDL Cholesterol (Calc): 97 mg/dL (calc)
Non-HDL Cholesterol (Calc): 109 mg/dL (calc) (ref <130)
Total CHOL/HDL Ratio: 2.4 (calc) (ref <5.0)
Triglycerides: 41 mg/dL (ref <150)

## 2019-06-01 LAB — COMPREHENSIVE METABOLIC PANEL
AG Ratio: 1.8 (calc) (ref 1.0–2.5)
ALT: 11 U/L (ref 6–29)
AST: 15 U/L (ref 10–30)
Albumin: 4.7 g/dL (ref 3.6–5.1)
Alkaline phosphatase (APISO): 41 U/L (ref 31–125)
BUN: 14 mg/dL (ref 7–25)
CO2: 26 mmol/L (ref 20–32)
Calcium: 9.6 mg/dL (ref 8.6–10.2)
Chloride: 104 mmol/L (ref 98–110)
Creat: 0.78 mg/dL (ref 0.50–1.10)
Globulin: 2.6 g/dL (calc) (ref 1.9–3.7)
Glucose, Bld: 101 mg/dL — ABNORMAL HIGH (ref 65–99)
Potassium: 4.5 mmol/L (ref 3.5–5.3)
Sodium: 137 mmol/L (ref 135–146)
Total Bilirubin: 0.4 mg/dL (ref 0.2–1.2)
Total Protein: 7.3 g/dL (ref 6.1–8.1)

## 2019-06-01 LAB — CBC
HCT: 40.1 % (ref 35.0–45.0)
Hemoglobin: 13.4 g/dL (ref 11.7–15.5)
MCH: 28.6 pg (ref 27.0–33.0)
MCHC: 33.4 g/dL (ref 32.0–36.0)
MCV: 85.7 fL (ref 80.0–100.0)
MPV: 10.5 fL (ref 7.5–12.5)
Platelets: 263 10*3/uL (ref 140–400)
RBC: 4.68 10*6/uL (ref 3.80–5.10)
RDW: 11.9 % (ref 11.0–15.0)
WBC: 8.1 10*3/uL (ref 3.8–10.8)

## 2019-06-01 LAB — RPR: RPR Ser Ql: NONREACTIVE

## 2019-06-01 LAB — HIV-1 RNA QUANT-NO REFLEX-BLD
HIV 1 RNA Quant: 23 copies/mL — ABNORMAL HIGH
HIV-1 RNA Quant, Log: 1.36 Log copies/mL — ABNORMAL HIGH

## 2019-06-07 ENCOUNTER — Encounter: Payer: Self-pay | Admitting: Infectious Diseases

## 2019-06-07 ENCOUNTER — Other Ambulatory Visit: Payer: Self-pay

## 2019-06-07 ENCOUNTER — Ambulatory Visit (INDEPENDENT_AMBULATORY_CARE_PROVIDER_SITE_OTHER): Payer: BC Managed Care – PPO | Admitting: Infectious Diseases

## 2019-06-07 VITALS — BP 127/89 | HR 90 | Temp 97.9°F | Wt 125.6 lb

## 2019-06-07 DIAGNOSIS — G4700 Insomnia, unspecified: Secondary | ICD-10-CM

## 2019-06-07 DIAGNOSIS — Z79899 Other long term (current) drug therapy: Secondary | ICD-10-CM

## 2019-06-07 DIAGNOSIS — B20 Human immunodeficiency virus [HIV] disease: Secondary | ICD-10-CM

## 2019-06-07 DIAGNOSIS — Z113 Encounter for screening for infections with a predominantly sexual mode of transmission: Secondary | ICD-10-CM | POA: Diagnosis not present

## 2019-06-07 DIAGNOSIS — R87613 High grade squamous intraepithelial lesion on cytologic smear of cervix (HGSIL): Secondary | ICD-10-CM

## 2019-06-07 DIAGNOSIS — F4322 Adjustment disorder with anxiety: Secondary | ICD-10-CM | POA: Diagnosis not present

## 2019-06-07 MED ORDER — TRAZODONE HCL 50 MG PO TABS: 25.0000 mg | ORAL_TABLET | Freq: Every evening | ORAL | 1 refills | Status: DC | PRN

## 2019-06-07 NOTE — Assessment & Plan Note (Signed)
She has stopped the lexapro.  Will refill/restart when she needs.  Offered to restart for her insomnia but she defers.

## 2019-06-07 NOTE — Assessment & Plan Note (Signed)
She is interested in rx- Has taken melatonin but wakes up in middle of night.  Will give her low dose of trazadone. As a trial.

## 2019-06-07 NOTE — Progress Notes (Signed)
Subjective:    Patient ID: Jacqueline Dawson, female    DOB: 10/06/1975, 44 y.o.   MRN: [id number]  HPI 44yo F with hx of HIV+ and previous abn PAP/LEEP. 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 She had endometrial bx 05-2015 that was benign. 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  She had endometrial ablation on 01-25-16. Had f/u 04-2016, normal pap.Her last PAP was (NL) 05-2018.   Previously was changed from Atripla to Complera. Complera recently due to persistent blips. HLA (-) 07-2013. Was then changed to triumeq 09-2013. Thenchanged to biktarvy ("I love it").   Has11and 23yo kids.They are doing well. Has a grandbaby now (3 yr old in June).  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  She had diagnostic mammo due to "spot" that was (-) 12-2017.October 2020 (-), done at work. COVID Vax 2-11 and 3-11.    Getting married! Fiance is (-). Has condoms from work. Does not want prep.    HIV 1 RNA Quant (copies/mL)  Date Value  05/27/2019 23 (H)  07/29/2018 44 (H)  01/13/2018 33 (H)   CD4 T Cell Abs (/uL)  Date Value  05/27/2019 523  07/29/2018 497  01/13/2018 320 (L)     Review of Systems  Constitutional: Negative for appetite change, chills, fever and unexpected weight change.  Respiratory: Negative for cough and shortness of breath.   Gastrointestinal: Negative for constipation and diarrhea.  Genitourinary: Negative for difficulty urinating and menstrual problem.  Psychiatric/Behavioral: Positive for sleep disturbance (she attributes to her work).   Please see HPI. All other systems reviewed and negative.     Objective:   Physical Exam Constitutional:      Appearance: Normal appearance. She is normal weight.  HENT:     Mouth/Throat:     Mouth: Mucous membranes are moist.     Pharynx: No oropharyngeal exudate.  Eyes:     Extraocular Movements: Extraocular movements intact.     Pupils: Pupils are equal, round, and reactive to light.    Cardiovascular:     Rate and Rhythm: Normal rate and regular rhythm.  Pulmonary:     Effort: Pulmonary effort is normal.     Breath sounds: Normal breath sounds.  Abdominal:     General: Bowel sounds are normal. There is no distension.     Palpations: Abdomen is soft.     Tenderness: There is no abdominal tenderness.  Musculoskeletal:        General: No swelling.     Cervical back: Normal range of motion.     Right lower leg: No edema.     Left lower leg: No edema.  Lymphadenopathy:     Cervical: No cervical adenopathy.  Neurological:     Mental Status: She is alert.  Psychiatric:        Mood and Affect: Mood normal.           Assessment & Plan:   

## 2019-06-07 NOTE — Assessment & Plan Note (Signed)
She is doing very well Will send note to pharm for cabenuva She has condoms. Offered more.  vax are up to date.  rtc in 9 months unless cabenuva sooner.

## 2019-06-07 NOTE — Assessment & Plan Note (Signed)
Appreciate f/u with Dr Mancel Bale.

## 2019-06-09 ENCOUNTER — Other Ambulatory Visit: Payer: Self-pay | Admitting: Infectious Diseases

## 2019-06-09 ENCOUNTER — Other Ambulatory Visit: Payer: Self-pay

## 2019-06-09 DIAGNOSIS — B20 Human immunodeficiency virus [HIV] disease: Secondary | ICD-10-CM

## 2019-06-09 MED ORDER — BIKTARVY 50-200-25 MG PO TABS: 1.0000 | ORAL_TABLET | Freq: Every day | ORAL | 5 refills | Status: DC

## 2019-06-10 ENCOUNTER — Encounter: Payer: BC Managed Care – PPO | Admitting: Infectious Diseases

## 2019-06-14 ENCOUNTER — Other Ambulatory Visit: Payer: Self-pay

## 2019-06-14 ENCOUNTER — Ambulatory Visit: Payer: BC Managed Care – PPO

## 2019-06-14 DIAGNOSIS — F4322 Adjustment disorder with anxiety: Secondary | ICD-10-CM

## 2019-06-14 NOTE — Progress Notes (Unsigned)
Mental Health Therapist Progress Note   Name: Jacqueline Dawson  Total time: 60  Type of Service: Individual Outpatient Mental Health Therapy  OBJECTIVE:  Mood: Euthymic and Affect: Appropriate Risk of harm to self or others: No plan to harm self or others  DIAGNOSIS:   GOALS ADDRESSED:  Patient will: 1.  Reduce symptoms of: anxiety  2.  Increase knowledge and/or ability of: coping skills  3.  Demonstrate ability to: {IBH Goals:21014053}  INTERVENTIONS: Interventions utilized:  Supportive Counseling Therapist met with patient for outpatient mental health individual therapy to include ongoing assessment, support, and reinforcement.  Therapist allowed patient to "check in" since previous session; asking patient to share any positive coping skills they may have used over the previous week, along with any challenges faced.  Therapist provided supportive listening as patient processed their thoughts, emotional responses, and behaviors surrounding several stressor, particularly related to her relationship with her fiancee and upcoming marriage.  EFFECTIVENESS/PLAN: Client was alert, oriented x3, with no SI, HI, or symptoms of psychosis (risk low).  Client was pleasant and friendly, engaging openly and appropriately with therapist, benefiting from supportive listening and exploration of feelings.  Next session recommended in one to two weeks.   Janet L Cecil, LCSW  

## 2019-06-14 NOTE — Progress Notes (Signed)
This encounter was created in error - please disregard.

## 2019-06-15 ENCOUNTER — Telehealth: Payer: Self-pay | Admitting: Pharmacist

## 2019-06-15 NOTE — Telephone Encounter (Signed)
Faxed patient's Cabenuva enrollment form to [facility] today. They will assess patient's insurance and send a benefits investigation form detailing how to start the injections (where to get it from and cost). Once benefits investigation is complete, we will order oral lead-in therapy and start patient on treatment. Will update encounter with further details when available. 

## 2019-06-16 NOTE — Telephone Encounter (Signed)
Thanks

## 2019-06-23 ENCOUNTER — Other Ambulatory Visit: Payer: Self-pay

## 2019-06-23 DIAGNOSIS — G4700 Insomnia, unspecified: Secondary | ICD-10-CM

## 2019-06-23 MED ORDER — TRAZODONE HCL 50 MG PO TABS: 25.0000 mg | ORAL_TABLET | Freq: Every evening | ORAL | 1 refills | Status: DC | PRN

## 2019-06-28 ENCOUNTER — Ambulatory Visit: Payer: BC Managed Care – PPO

## 2019-06-28 ENCOUNTER — Telehealth: Payer: Self-pay | Admitting: *Deleted

## 2019-06-28 NOTE — Telephone Encounter (Signed)
Is Jacqueline Dawson on the [place] list?

## 2019-06-28 NOTE — Telephone Encounter (Signed)
Thanks

## 2019-06-28 NOTE — Telephone Encounter (Signed)
Trazodone is working for patient, would like to keep taking it.  She asked for update on injection - understands this will take several months to roll out, but that she is on the interest list. She is unable to keep her counseling appointment with Marcie Bal today (had work conflict).  She is rescheduled for 5/27 for the full 60 minutes. Landis Gandy, RN

## 2019-06-28 NOTE — Telephone Encounter (Signed)
Yes I have submitted her application

## 2019-07-07 ENCOUNTER — Ambulatory Visit: Payer: BC Managed Care – PPO

## 2019-07-18 ENCOUNTER — Other Ambulatory Visit: Payer: Self-pay | Admitting: Infectious Diseases

## 2019-07-18 DIAGNOSIS — G4700 Insomnia, unspecified: Secondary | ICD-10-CM

## 2019-07-18 NOTE — Telephone Encounter (Signed)
Sent patient a MyChart message asking how medication was working for her. Will refill if ok with MD. Jacqueline Dawson

## 2019-07-19 MED ORDER — TRAZODONE HCL 50 MG PO TABS: 25.0000 mg | ORAL_TABLET | Freq: Every evening | ORAL | 1 refills | Status: DC | PRN

## 2019-09-21 ENCOUNTER — Other Ambulatory Visit: Payer: Self-pay | Admitting: Infectious Diseases

## 2019-09-21 ENCOUNTER — Other Ambulatory Visit: Payer: Self-pay

## 2019-09-21 DIAGNOSIS — G4700 Insomnia, unspecified: Secondary | ICD-10-CM

## 2019-09-21 MED ORDER — TRAZODONE HCL 50 MG PO TABS: 25.0000 mg | ORAL_TABLET | Freq: Every evening | ORAL | 3 refills | Status: DC | PRN

## 2019-11-08 IMAGING — MG DIGITAL DIAGNOSTIC UNILATERAL RIGHT MAMMOGRAM WITH TOMO AND CAD
3 series · 3 of 7 positions shown · non-contrast
Comparison: 12/01/2017 and earlier

CLINICAL DATA: The patient returns after screening study for
evaluation of possible RIGHT breast asymmetry

EXAM:
DIGITAL DIAGNOSTIC UNILATERAL RIGHT MAMMOGRAM WITH CAD AND TOMO

[R CC synth-2D]
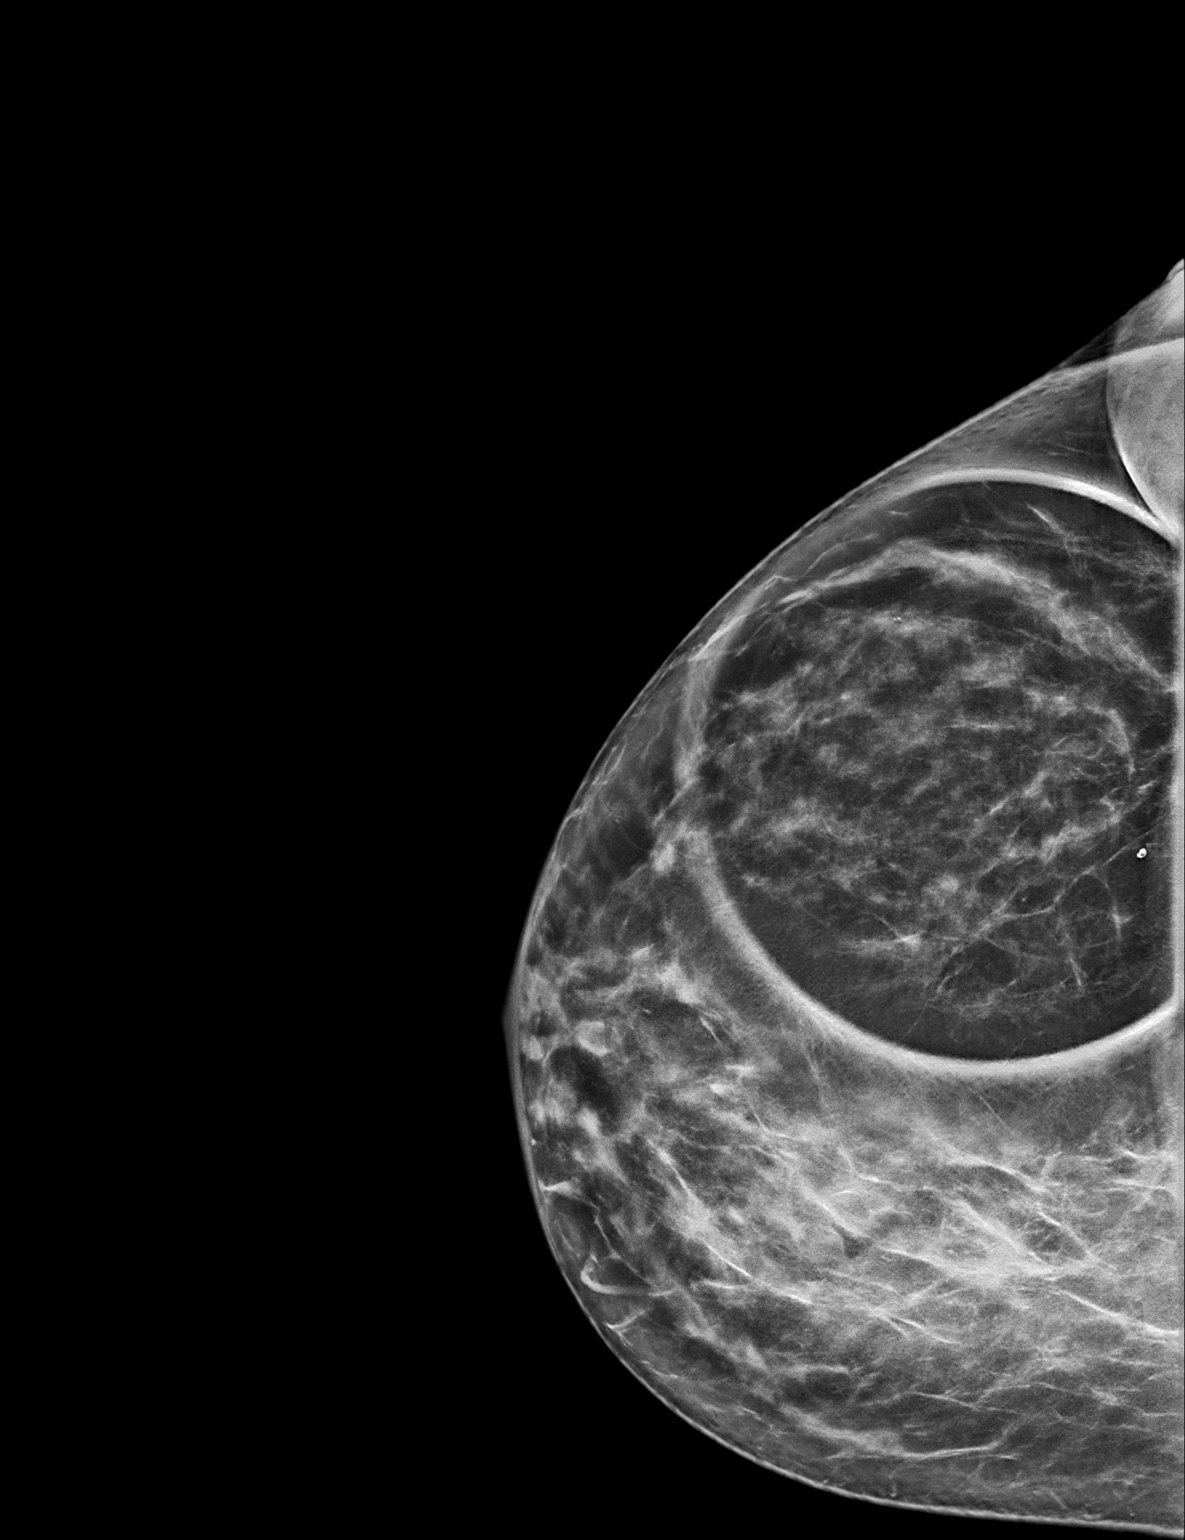

[R MLO synth-2D]
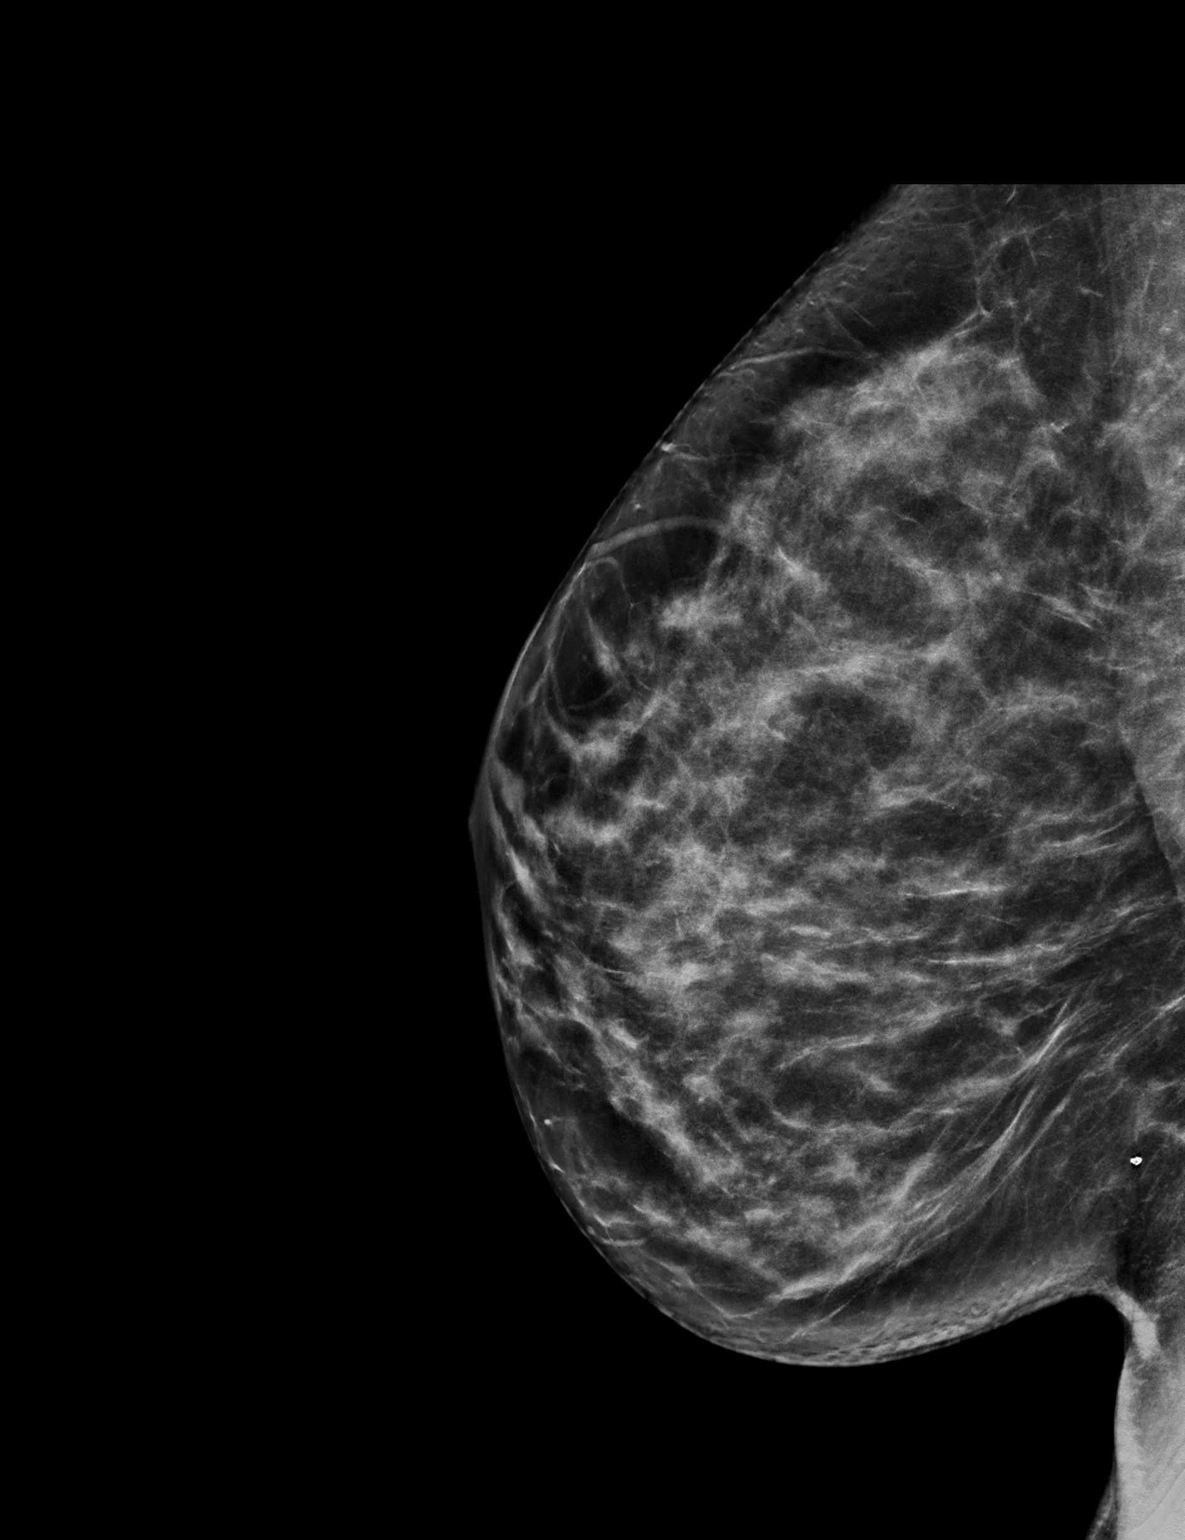

[R CC tomo · tomo slice 35/70.0]
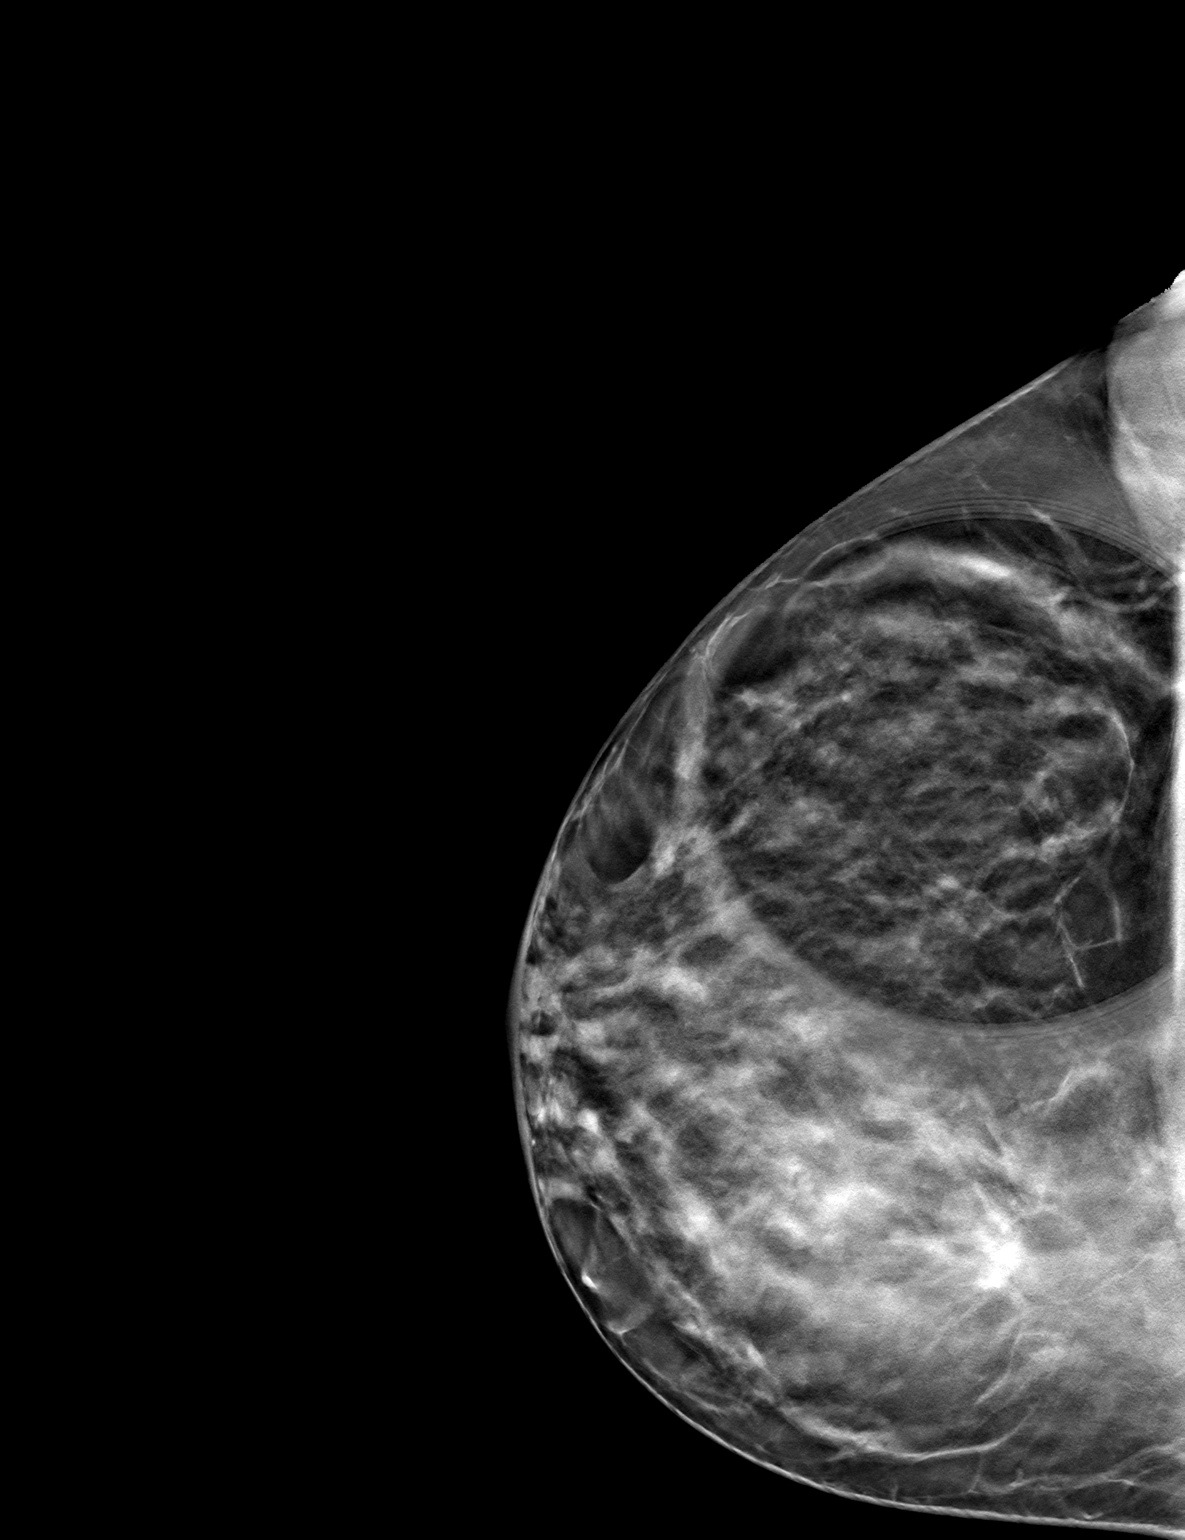

[3 of 7 positions shown; findings below may reference images not displayed]

ACR Breast Density Category c: The breast tissue is heterogeneously
dense, which may obscure small masses.
FINDINGS: Additional 2-D and 3-D images are performed. No persistent
abnormality identified in the LATERAL portion of the RIGHT breast.

Mammographic images were processed with CAD.
IMPRESSION: No mammographic evidence for malignancy.

RECOMMENDATION:
Screening mammogram in one year.(Code:[id number])

I have discussed the findings and recommendations with the patient.
Results were also provided in writing at the conclusion of the
visit. If applicable, a reminder letter will be sent to the patient
regarding the next appointment.

BI-RADS CATEGORY  1: Negative.

## 2019-11-30 ENCOUNTER — Telehealth: Payer: Self-pay

## 2019-11-30 NOTE — Telephone Encounter (Signed)
Sent patient my chart message regarding Kern Reap

## 2020-01-16 ENCOUNTER — Other Ambulatory Visit (HOSPITAL_COMMUNITY)
Admission: RE | Admit: 2020-01-16 | Discharge: 2020-01-16 | Disposition: A | Discharge status: Home / Self Care | Payer: BC Managed Care – PPO | Source: Ambulatory Visit | Attending: Infectious Diseases | Admitting: Infectious Diseases

## 2020-01-16 ENCOUNTER — Other Ambulatory Visit: Payer: BC Managed Care – PPO

## 2020-01-16 ENCOUNTER — Other Ambulatory Visit: Payer: Self-pay

## 2020-01-16 DIAGNOSIS — Z113 Encounter for screening for infections with a predominantly sexual mode of transmission: Secondary | ICD-10-CM | POA: Insufficient documentation

## 2020-01-16 DIAGNOSIS — Z79899 Other long term (current) drug therapy: Secondary | ICD-10-CM

## 2020-01-16 DIAGNOSIS — B20 Human immunodeficiency virus [HIV] disease: Secondary | ICD-10-CM

## 2020-01-17 LAB — URINE CYTOLOGY ANCILLARY ONLY
Chlamydia: NEGATIVE
Comment: NEGATIVE
Comment: NORMAL
Neisseria Gonorrhea: NEGATIVE

## 2020-01-17 LAB — T-HELPER CELL (CD4) - (RCID CLINIC ONLY)
CD4 % Helper T Cell: 22 % — ABNORMAL LOW (ref 33–65)
CD4 T Cell Abs: 299 /uL — ABNORMAL LOW (ref 400–1790)

## 2020-01-18 LAB — COMPREHENSIVE METABOLIC PANEL
AG Ratio: 1.4 (calc) (ref 1.0–2.5)
ALT: 9 U/L (ref 6–29)
AST: 17 U/L (ref 10–30)
Albumin: 4.2 g/dL (ref 3.6–5.1)
Alkaline phosphatase (APISO): 41 U/L (ref 31–125)
BUN: 16 mg/dL (ref 7–25)
CO2: 28 mmol/L (ref 20–32)
Calcium: 9.4 mg/dL (ref 8.6–10.2)
Chloride: 106 mmol/L (ref 98–110)
Creat: 0.88 mg/dL (ref 0.50–1.10)
Globulin: 2.9 g/dL (calc) (ref 1.9–3.7)
Glucose, Bld: 75 mg/dL (ref 65–99)
Potassium: 4.1 mmol/L (ref 3.5–5.3)
Sodium: 141 mmol/L (ref 135–146)
Total Bilirubin: 0.2 mg/dL (ref 0.2–1.2)
Total Protein: 7.1 g/dL (ref 6.1–8.1)

## 2020-01-18 LAB — CBC
HCT: 36.4 % (ref 35.0–45.0)
Hemoglobin: 12 g/dL (ref 11.7–15.5)
MCH: 28 pg (ref 27.0–33.0)
MCHC: 33 g/dL (ref 32.0–36.0)
MCV: 84.8 fL (ref 80.0–100.0)
MPV: 10.7 fL (ref 7.5–12.5)
Platelets: 223 10*3/uL (ref 140–400)
RBC: 4.29 10*6/uL (ref 3.80–5.10)
RDW: 11.8 % (ref 11.0–15.0)
WBC: 4.5 10*3/uL (ref 3.8–10.8)

## 2020-01-18 LAB — LIPID PANEL
Cholesterol: 182 mg/dL (ref <200)
HDL: 72 mg/dL (ref >=50)
LDL Cholesterol (Calc): 90 mg/dL (calc)
Non-HDL Cholesterol (Calc): 110 mg/dL (calc) (ref <130)
Total CHOL/HDL Ratio: 2.5 (calc) (ref <5.0)
Triglycerides: 105 mg/dL (ref <150)

## 2020-01-18 LAB — HIV-1 RNA QUANT-NO REFLEX-BLD
HIV 1 RNA Quant: 4920 Copies/mL — ABNORMAL HIGH
HIV-1 RNA Quant, Log: 3.69 Log cps/mL — ABNORMAL HIGH

## 2020-01-18 LAB — RPR: RPR Ser Ql: NONREACTIVE

## 2020-01-30 ENCOUNTER — Ambulatory Visit: Payer: BC Managed Care – PPO | Attending: Family

## 2020-01-30 DIAGNOSIS — Z23 Encounter for immunization: Secondary | ICD-10-CM

## 2020-01-31 ENCOUNTER — Ambulatory Visit (INDEPENDENT_AMBULATORY_CARE_PROVIDER_SITE_OTHER): Payer: BC Managed Care – PPO | Admitting: Infectious Diseases

## 2020-01-31 ENCOUNTER — Other Ambulatory Visit: Payer: Self-pay

## 2020-01-31 ENCOUNTER — Encounter: Payer: Self-pay | Admitting: Infectious Diseases

## 2020-01-31 VITALS — BP 147/99 | HR 89 | Temp 98.5°F | Wt 126.0 lb

## 2020-01-31 DIAGNOSIS — G4700 Insomnia, unspecified: Secondary | ICD-10-CM | POA: Diagnosis not present

## 2020-01-31 DIAGNOSIS — Z79899 Other long term (current) drug therapy: Secondary | ICD-10-CM | POA: Diagnosis not present

## 2020-01-31 DIAGNOSIS — Z113 Encounter for screening for infections with a predominantly sexual mode of transmission: Secondary | ICD-10-CM

## 2020-01-31 DIAGNOSIS — Z23 Encounter for immunization: Secondary | ICD-10-CM | POA: Diagnosis not present

## 2020-01-31 DIAGNOSIS — B20 Human immunodeficiency virus [HIV] disease: Secondary | ICD-10-CM

## 2020-01-31 NOTE — Assessment & Plan Note (Signed)
She wants cabaneuva, will send to pharmacy.  Will give her pcv 23 Offered/refused condoms, gets at work rtc in 4-5 months.

## 2020-01-31 NOTE — Assessment & Plan Note (Signed)
Continues to take trazadone which she states works very well.

## 2020-01-31 NOTE — Progress Notes (Signed)
   Subjective:    Patient ID: Jacqueline Dawson, female    DOB: [birth year], 44 y.o.   MRN: [id number]  HPI 44yo F with hx of HIV+ and previous abn PAP/LEEP. 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 She had endometrial bx 05-2015 that was benign. 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  She had endometrial ablation on 01-25-16. Had f/u 04-2016, normal pap.Her last PAP was (NL) 05-2018.Believes her last was in March or April of 2021.   Previously was changed from [place] to [place]. Complera recently due to persistent blips. HLA (-) 07-2013. Was then changed to triumeq 09-2013. Thenchanged to [employer].  Has11and 23yo kids.They are doing well. Has a grandbaby now (87 yr old in June).  Works for [employer].  She had diagnostic mammo due to "spot" that was (-) 12-2017.October 2020 (-), done at work. November 2021 (-) at work.  COVID Vax 2-11 and 3-11.    Getting married! Fiance is (-). Getting married in November. Has condoms from work. Does not want prep.   Today c/o being always tired- worsened anxiety. Getting new house. Always working (2 jobs).  Has been off biktarvy. Forgets to take in pm when she gets home. Interested in injectable.   Review of Systems  Constitutional: Positive for fatigue. Negative for appetite change, chills, fever and unexpected weight change.  Gastrointestinal: Negative for constipation and diarrhea.  Genitourinary: Negative for difficulty urinating.       Objective:   Physical Exam Vitals reviewed.  Constitutional:      Appearance: Normal appearance.  HENT:     Mouth/Throat:     Mouth: Mucous membranes are moist.     Pharynx: No oropharyngeal exudate.  Eyes:     Extraocular Movements: Extraocular movements intact.     Pupils: Pupils are equal, round, and reactive to light.  Cardiovascular:     Rate and Rhythm: Normal rate and regular rhythm.  Pulmonary:     Effort: Pulmonary effort is normal.     Breath  sounds: Normal breath sounds.  Abdominal:     General: Bowel sounds are normal. There is no distension.     Palpations: Abdomen is soft.     Tenderness: There is no abdominal tenderness.  Musculoskeletal:        General: Normal range of motion.     Cervical back: Neck supple.  Neurological:     General: No focal deficit present.     Mental Status: She is alert.  Psychiatric:        Mood and Affect: Mood normal.           Assessment & Plan:

## 2020-01-31 NOTE — Addendum Note (Signed)
Addended by: Daisy Floro T on: 01/31/2020 04:48 PM   Modules accepted: Orders

## 2020-02-01 ENCOUNTER — Telehealth: Payer: Self-pay

## 2020-02-01 NOTE — Telephone Encounter (Signed)
[facility] Patient Advocate Encounter   Received notification from [employer] that prior authorization for CABENUVA is required.   PA submitted on 02/01/20 Key [id number] Status is pending    [facility] will continue to follow.   [name], [place] [facility] Patient [facility] for Infectious Disease Phone: [phone] Fax:  [phone]

## 2020-02-16 ENCOUNTER — Telehealth: Payer: Self-pay

## 2020-02-16 NOTE — Telephone Encounter (Signed)
[facility] Patient Advocate Encounter  Prior Authorization for CABENUVA has been approved.    Prescription can be filled at [facility].   Effective dates: 02/16/20 through 02/15/21  Patients co-pay is $2812.28.   I was able to get a Co-pay Coupon Card for the patient to make the medication co-pay $0.00   BIN: [id number] PCN: [id number] GROUP: [id number] Patient ID [id number]     [facility] will continue to follow.  [name], CPhT Specialty Pharmacy Patient [facility] for Infectious Disease Phone: [phone] Fax:  [phone]

## 2020-02-17 ENCOUNTER — Other Ambulatory Visit: Payer: Self-pay | Admitting: Pharmacist

## 2020-02-17 DIAGNOSIS — B20 Human immunodeficiency virus [HIV] disease: Secondary | ICD-10-CM

## 2020-02-17 MED ORDER — VOCABRIA 30 MG PO TABS: 1.0000 | ORAL_TABLET | Freq: Every day | ORAL | 0 refills | Status: DC

## 2020-02-17 MED ORDER — EDURANT 25 MG PO TABS
25.0000 mg | ORAL_TABLET | Freq: Every day | ORAL | 0 refills | Status: DC
Start: 2020-02-17 — End: 2020-03-23

## 2020-02-23 ENCOUNTER — Telehealth: Payer: Self-pay

## 2020-02-23 NOTE — Telephone Encounter (Signed)
[facility] Patient Advocate Encounter  Patient's medications Jacqueline Dawson & Jacqueline Dawson) have been delivered to [facility] from [facility] and will be in on her next appointment on 02/23/10  Jacqueline Dawson , [place] Patient [facility] for Infectious Disease Phone: [phone] Fax:  [phone]

## 2020-02-24 ENCOUNTER — Other Ambulatory Visit: Payer: Self-pay

## 2020-02-24 ENCOUNTER — Other Ambulatory Visit: Payer: Self-pay | Admitting: Pharmacist

## 2020-02-24 ENCOUNTER — Ambulatory Visit (INDEPENDENT_AMBULATORY_CARE_PROVIDER_SITE_OTHER): Payer: BC Managed Care – PPO | Admitting: Pharmacist

## 2020-02-24 DIAGNOSIS — B20 Human immunodeficiency virus [HIV] disease: Secondary | ICD-10-CM | POA: Diagnosis not present

## 2020-02-24 MED ORDER — CABOTEGRAVIR & RILPIVIRINE ER 600 & 900 MG/3ML IM SUER: 1.0000 | Freq: Once | INTRAMUSCULAR | 0 refills | Status: DC

## 2020-02-24 NOTE — Progress Notes (Signed)
HPI: Jacqueline Dawson is a 45 y.o. female who presents to the [place] clinic for HIV follow-up.  Patient Active Problem List   Diagnosis Date Noted  . HTN (hypertension) 01/27/2018  . Adjustment disorder with anxious mood 01/15/2016  . Vaginal discharge 08/02/2013  . Insomnia 10/09/2010  . ECTOPIC PREGNANCY 04/02/2007  . Human immunodeficiency virus (HIV) disease ([place]) 11/11/2005  . Depression 11/11/2005  . ABFND PAP SMEAR HGSIL 11/11/2005    Patient's Medications  New Prescriptions   No medications on file  Previous Medications   CABOTEGRAVIR SODIUM (VOCABRIA) 30 MG TABS    Take 1 tablet by mouth daily.   CHOLECALCIFEROL (VITAMIN D) 1000 UNITS TABLET    Take 1,000 Units by mouth daily.   MULTIPLE VITAMINS-MINERALS (MULTIVITAMINS THER. W/MINERALS) TABS    Take 1 tablet by mouth daily.   RILPIVIRINE (EDURANT) 25 MG TABS TABLET    Take 1 tablet (25 mg total) by mouth daily with breakfast.   TRAZODONE (DESYREL) 50 MG TABLET    Take 0.5 tablets (25 mg total) by mouth at bedtime as needed for sleep.   VALACYCLOVIR (VALTREX) 500 MG TABLET    valacyclovir 500 mg tablet  TAKE 1 TABLET BY MOUTH EVERY DAY  Modified Medications   No medications on file  Discontinued Medications   No medications on file    Allergies: Allergies  Allergen Reactions  . Sulfonamide Derivatives Rash    Past Medical History: Past Medical History:  Diagnosis Date  . Anxiety   . HIV positive ([place])     Social History: Social History   Socioeconomic History  . Marital status: Divorced    Spouse name: Not on file  . Number of children: Not on file  . Years of education: Not on file  . Highest education level: Not on file  Occupational History  . Not on file  Tobacco Use  . Smoking status: Former Smoker    Packs/day: 0.30    Years: 16.00    Pack years: 4.80    Types: Cigarettes    Quit date: 01/28/2011    Years since quitting: 9.0  . Smokeless tobacco: Never Used  . Tobacco comment:  congratulated!  Substance and Sexual Activity  . Alcohol use: Yes    Alcohol/week: 7.0 standard drinks    Types: 7 Glasses of wine per week    Comment: occasional  . Drug use: Not Currently    Frequency: 2.0 times per week    Types: Marijuana    Comment: 12/12/15  . Sexual activity: Not Currently    Partners: Male    Birth control/protection: Surgical    Comment: pt. declined condoms  Other Topics Concern  . Not on file  Social History Narrative  . Not on file   Social Determinants of Health   Financial Resource Strain: Not on file  Food Insecurity: Not on file  Transportation Needs: Not on file  Physical Activity: Not on file  Stress: Not on file  Social Connections: Not on file    Labs: Lab Results  Component Value Date   HIV1RNAQUANT 4,920 (H) 01/16/2020   HIV1RNAQUANT 23 (H) 05/27/2019   HIV1RNAQUANT 44 (H) 07/29/2018   CD4TABS 299 (L) 01/16/2020   CD4TABS 523 05/27/2019   CD4TABS 497 07/29/2018    RPR and STI Lab Results  Component Value Date   LABRPR NON-REACTIVE 01/16/2020   LABRPR NON-REACTIVE 05/27/2019   LABRPR NON-REACTIVE 07/29/2018   LABRPR NON-REACTIVE 01/13/2018   LABRPR NON REAC 07/16/2016  STI Results GC CT  01/16/2020 Negative Negative  07/29/2018 Negative Negative  01/13/2018 Negative Negative  07/11/2015 Negative Negative  01/08/2015 Negative Negative  12/29/2014 Negative Negative    Hepatitis B Lab Results  Component Value Date   HEPBSAB NEG 05/11/2013   HEPBSAG NO 04/06/2006   Hepatitis C No results found for: HEPCAB, HCVRNAPCRQN Hepatitis A No results found for: HAV Lipids: Lab Results  Component Value Date   CHOL 182 01/16/2020   TRIG 105 01/16/2020   HDL 72 01/16/2020   CHOLHDL 2.5 01/16/2020   VLDL 12 07/16/2016   LDLCALC 90 01/16/2020    Current HIV Regimen: Biktarvy  Assessment: Jacqueline Dawson is here today to initiate [place].   Discussed the process of starting Cabenuva injections including the need to take oral  lead-in therapy for 28-30 days to assess tolerability to cabotegravir. Counseled patient to take one tablet of rilpivirine + one tablet of cabotegravir once daily WITH food. Encouraged patient to take the tablets around the same time each day and to make sure it is with a meal. Advised to never take one without the other. Discussed possible side effects such as nausea, headache, insomnia, and abnormal dreams. Explained the importance of not missing any doses of the 30 day regimen to make sure no resistance develops. Explained to patient that we will set a target date each month to administer injections. Explained the importance of showing up to appointments and not missing any monthly injections. Warned that if patient misses 2 appointments, it will be reassessed by their provider whether they are a good candidate for injection therapy. Made appointment for patient to come in and receive initial injection 28-30 days after starting oral lead-in therapy.  Counseled that [place] is two separate intramuscular injections in the gluteal muscle on each side for each monthly visit. Cautioned on possible side effects such as injection-site reactions, fatigue, headache, nasuea, rash, and dizziness.  Will make patient's follow up appointments for 6 months to help with compliance. Answered all questions. Gave patient my card to call me with any issues.  Plan: - Stop Biktarvy - Start [place] + Edurant once daily with food x 30 days - Initial Cabenuva injection appointment scheduled with [place] on 2/11 at 845am - Follow up [place] injection appointments scheduled through 5 months - Sent injection prescription to [facility] to mail to clinic for administration - Call with any issues or questions  Jacqueline Dawson, PharmD, BCIDP, AAHIVP, CPP Clinical Pharmacist Practitioner Infectious Diseases [place] for Infectious Disease 02/24/2020, 9:34 AM

## 2020-03-02 ENCOUNTER — Other Ambulatory Visit: Payer: Self-pay | Admitting: Pharmacist

## 2020-03-02 DIAGNOSIS — B20 Human immunodeficiency virus [HIV] disease: Secondary | ICD-10-CM

## 2020-03-02 MED ORDER — CABOTEGRAVIR & RILPIVIRINE ER 400 & 600 MG/2ML IM SUER: 1.0000 | INTRAMUSCULAR | 11 refills | Status: DC

## 2020-03-02 NOTE — Progress Notes (Signed)
Sending in maintenance dose of Cabenuva to [facility] so Butch Penny can coordinate after patient receives her initial loading dose injection.

## 2020-03-06 ENCOUNTER — Other Ambulatory Visit: Payer: Self-pay | Admitting: Infectious Diseases

## 2020-03-06 DIAGNOSIS — Z21 Asymptomatic human immunodeficiency virus [HIV] infection status: Secondary | ICD-10-CM

## 2020-03-15 MED FILL — CABENUVA 600 & 900 MG/3ML S: 600 & 900 | 30 days supply | Qty: 6 | Fill #0

## 2020-03-16 ENCOUNTER — Telehealth: Payer: Self-pay

## 2020-03-16 NOTE — Telephone Encounter (Signed)
[facility] Patient Advocate Encounter  Patient's medication Kern Reap) have been couriered to [facility] from [facility] and will be administered on next office visit on 03/23/20.  [name] , [place] [facility] Patient [facility] for Infectious Disease Phone: [phone] Fax:  [phone]

## 2020-03-23 ENCOUNTER — Encounter: Payer: Self-pay | Admitting: Infectious Diseases

## 2020-03-23 ENCOUNTER — Ambulatory Visit (INDEPENDENT_AMBULATORY_CARE_PROVIDER_SITE_OTHER): Payer: BC Managed Care – PPO | Admitting: Infectious Diseases

## 2020-03-23 ENCOUNTER — Other Ambulatory Visit: Payer: Self-pay

## 2020-03-23 DIAGNOSIS — R87613 High grade squamous intraepithelial lesion on cytologic smear of cervix (HGSIL): Secondary | ICD-10-CM | POA: Diagnosis not present

## 2020-03-23 DIAGNOSIS — B20 Human immunodeficiency virus [HIV] disease: Secondary | ICD-10-CM | POA: Diagnosis not present

## 2020-03-23 MED ORDER — CABOTEGRAVIR & RILPIVIRINE ER 600 & 900 MG/3ML IM SUER
1.0000 | Freq: Once | INTRAMUSCULAR | Status: AC
  Administered 2020-03-23: 1 via INTRAMUSCULAR

## 2020-03-23 MED ORDER — CABOTEGRAVIR & RILPIVIRINE ER 400 & 600 MG/2ML IM SUER: 1.0000 | Freq: Once | INTRAMUSCULAR | Status: DC

## 2020-03-23 NOTE — Patient Instructions (Signed)
We gave you your first injection of CABENUVA for treatment today.   Please stop your Cabotegravir and Rilpivarine pills now.   Please stop by the lab on your way out.   Your next appointment has been scheduled in 1 month. We will give your next injection and check your labs at that visit.    Helpful Tips:  If you experience any knots under the skin please use a warm compress to help.    Some people experience some redness at the site of the injection. This can be normal and should go away soon.   Moving around today is a good idea, if you sit too much it hurts more.   Please call the office to speak with our pharmacy or triage team if you have any questions or concerns.

## 2020-03-23 NOTE — Addendum Note (Signed)
Addended by: Daisy Floro T on: 03/23/2020 11:58 AM   Modules accepted: Orders

## 2020-03-23 NOTE — Addendum Note (Signed)
Addended by: Daisy Floro T on: 03/23/2020 09:27 AM   Modules accepted: Orders

## 2020-03-23 NOTE — Assessment & Plan Note (Signed)
She has a pap smear with her GYN scheduled today. I asked her to send records to [place] so we can update her chart.

## 2020-03-23 NOTE — Assessment & Plan Note (Signed)
Tolerated oral lead in well without side effects and took regimen correctly. Viral load will be checked today and again in 1 month given medication switch.   Tolerated initial injections of Cabenuva 600-900 mg well today.  The patient was monitored for 15 minutes in the office and no concern for side effects / allergic reaction to injection.   The patient will return in 30-days to proceed with first maintenance injection of CABENUVA 400-600mg  and repeat viral load. The patient will then continue this dose every 1 months thereafter. She will meet with Dr. Johnnye Sima in May to see if she would like to discuss potential for Q86m injections (will require a new PA for [employer]).

## 2020-03-23 NOTE — Progress Notes (Signed)
Subjective:    Patient ID: Jacqueline Dawson, female    DOB: 1976/01/16, 45 y.o.   MRN: [id number]  CC:  Cabenuva injection - First injection    HPI:  ELYSSE Dawson is a 45 y.o. female with well controlled HIV disease here for her first [place] injection visit. She completed the 28-day lead in with oral Cabotegrovir + Rilpivirine combination correctly with food everyday. Missed no doses. Tolerated well without side effect   She is excited to do an injectable regimen to help treat her condition. 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    Allergies  Allergen Reactions  . Sulfonamide Derivatives Rash      Outpatient Medications Prior to Visit  Medication Sig Dispense Refill  . cabotegravir & rilpivirine ER (CABENUVA) 400 & 600 MG/2ML injection Inject 1 kit into the muscle every 30 (thirty) days. 4 mL 11  . cholecalciferol (VITAMIN D) 1000 units tablet Take 1,000 Units by mouth daily.    . Multiple Vitamins-Minerals (MULTIVITAMINS THER. W/MINERALS) TABS Take 1 tablet by mouth daily.    . traZODone (DESYREL) 50 MG tablet Take 0.5 tablets (25 mg total) by mouth at bedtime as needed for sleep. 30 tablet 3  . valACYclovir (VALTREX) 500 MG tablet valacyclovir 500 mg tablet  TAKE 1 TABLET BY MOUTH EVERY DAY    . cabotegravir & rilpivirine ER (CABENUVA) 600 & 900 MG/3ML injection Inject 1 kit into the muscle once for 1 dose. 6 mL 0  . Cabotegravir Sodium (VOCABRIA) 30 MG TABS Take 1 tablet by mouth daily. 30 tablet 0  . rilpivirine (EDURANT) 25 MG TABS tablet Take 1 tablet (25 mg total) by mouth daily with breakfast. 30 tablet 0   No facility-administered medications prior to visit.     Past Medical History:  Diagnosis Date  . Anxiety   . HIV positive ([place])       Past Surgical History:  Procedure Laterality Date  . LAPAROSCOPIC OVARIAN CYSTECTOMY Left 12/13/2015   Procedure: LAPAROSCOPIC OVARIAN CYSTECTOMY;   Surgeon: Everett Graff, MD;  Location: [facility];  Service: Gynecology;  Laterality: Left;  . TUBAL LIGATION    . WISDOM TOOTH EXTRACTION        Review of Systems  Constitutional: Negative for appetite change, chills and unexpected weight change.  Gastrointestinal: Negative for diarrhea and nausea.  Neurological: Negative for dizziness and headaches.        Objective:    BP (!) 146/88   Pulse 90   Temp 98 F (36.7 C) (Oral)   Wt 126 lb (57.2 kg)   BMI 20.34 kg/m  Nursing note and vital signs reviewed.  Physical Exam Constitutional:      Appearance: Normal appearance. She is not ill-appearing.  HENT:     Mouth/Throat:     Mouth: Mucous membranes are moist.     Pharynx: Oropharynx is clear.  Eyes:     General: No scleral icterus. Pulmonary:     Effort: Pulmonary effort is normal.  Neurological:     Mental Status: She is oriented to person, place, and time.  Psychiatric:        Mood and Affect: Mood normal.        Thought Content: Thought content normal.        Assessment & Plan:   Patient Active Problem List   Diagnosis Date Noted  . HTN (hypertension) 01/27/2018  .  Adjustment disorder with anxious mood 01/15/2016  . Insomnia 10/09/2010  . ECTOPIC PREGNANCY 04/02/2007  . Human immunodeficiency virus (HIV) disease ([place]) 11/11/2005  . Depression 11/11/2005  . ABFND PAP SMEAR HGSIL 11/11/2005    Problem List Items Addressed This Visit      Unprioritized   Human immunodeficiency virus (HIV) disease ([place])    Tolerated oral lead in well without side effects and took regimen correctly. Viral load will be checked today and again in 1 month given medication switch.   Tolerated initial injections of Cabenuva 600-900 mg well today.  The patient was monitored for 15 minutes in the office and no concern for side effects / allergic reaction to injection.   The patient will return in 30-days to proceed with first maintenance injection of CABENUVA 400-63m and repeat  viral load. The patient will then continue this dose every 1 months thereafter. She will meet with Dr. HJohnnye Simain May to see if she would like to discuss potential for Q274mnjections (will require a new PA for [employer]          Relevant Orders   HIV-1 RNA quant-no reflex-bld   ABFND PAP SMEAR HGSIL    She has a pap smear with her GYN scheduled today. I asked her to send records to [place] we can update her chart.            I have discontinued Na[place]ald[place]'s Vo[place]o People's Democratic Republicnd EdChurdanI am also having her maintain her multivitamins ther. w/minerals, cholecalciferol, valACYclovir, traZODone, and cabotegravir & rilpivirine ER.   No orders of the defined types were placed in this encounter.    Follow-up: RTC in 30 days for maintenance injection. Appt has been scheduled.     StJanene MadeiraMSN, NP-C [facility] Infectious Disease [email or web address] Pager: [phone]: [phone]

## 2020-03-27 LAB — HIV-1 RNA QUANT-NO REFLEX-BLD
HIV 1 RNA Quant: <20 Copies/mL — ABNORMAL HIGH
HIV-1 RNA Quant, Log: <1.3 Log cps/mL — ABNORMAL HIGH

## 2020-04-12 ENCOUNTER — Telehealth: Payer: Self-pay

## 2020-04-12 NOTE — Telephone Encounter (Signed)
Patient called to reschedule her Cabenuva injection on 04-20-2020 due to family emergency.  Her father died in [place] and will be out of town.    Appointment rescheduled to 04-23-2020 which is within her target date of 7 days.    Laverle Patter, RN

## 2020-04-13 ENCOUNTER — Other Ambulatory Visit: Payer: Self-pay | Admitting: Family

## 2020-04-13 DIAGNOSIS — B20 Human immunodeficiency virus [HIV] disease: Secondary | ICD-10-CM

## 2020-04-13 MED ORDER — CABOTEGRAVIR & RILPIVIRINE ER 600 & 900 MG/3ML IM SUER: INTRAMUSCULAR | 6 refills | Status: DC

## 2020-04-16 ENCOUNTER — Other Ambulatory Visit: Payer: Self-pay | Admitting: Infectious Diseases

## 2020-04-16 ENCOUNTER — Other Ambulatory Visit: Payer: Self-pay | Admitting: Family

## 2020-04-16 DIAGNOSIS — B20 Human immunodeficiency virus [HIV] disease: Secondary | ICD-10-CM

## 2020-04-16 MED ORDER — CABOTEGRAVIR & RILPIVIRINE ER 600 & 900 MG/3ML IM SUER: INTRAMUSCULAR | 6 refills | Status: DC

## 2020-04-20 ENCOUNTER — Encounter: Payer: BC Managed Care – PPO | Admitting: Infectious Diseases

## 2020-04-23 ENCOUNTER — Encounter: Payer: Self-pay | Admitting: Infectious Diseases

## 2020-04-23 ENCOUNTER — Ambulatory Visit (INDEPENDENT_AMBULATORY_CARE_PROVIDER_SITE_OTHER): Payer: BC Managed Care – PPO | Admitting: Infectious Diseases

## 2020-04-23 ENCOUNTER — Other Ambulatory Visit: Payer: Self-pay

## 2020-04-23 VITALS — BP 145/93 | HR 98 | Temp 98.5°F | Wt 120.0 lb

## 2020-04-23 DIAGNOSIS — B20 Human immunodeficiency virus [HIV] disease: Secondary | ICD-10-CM

## 2020-04-23 MED ORDER — CABOTEGRAVIR & RILPIVIRINE ER 600 & 900 MG/3ML IM SUER
1.0000 | Freq: Once | INTRAMUSCULAR | Status: AC
  Administered 2020-04-23: 1 via INTRAMUSCULAR

## 2020-04-23 NOTE — Assessment & Plan Note (Signed)
Viral load last month was undetectable. She did well with minor injection site reactions from initial 2 rounds of shots.   Tolerated injections of Cabenuva 600-900 mg well today. Continue with q68m injections.   Next Injection: 06/19/2020 - she will also have routine labs at this visit with Dr. Johnnye Sima.

## 2020-04-23 NOTE — Patient Instructions (Signed)
We gave you your injection of CABENUVA for treatment today.    Your next appointment has been scheduled in 2 months on 06/19/2020.    Helpful Tips:  If you experience any knots under the skin please use a warm compress to help.    Some people experience some redness at the site of the injection. This can be normal and should go away soon.   Moving around today is a good idea, if you sit too much it hurts more.   Please call the office to speak with our pharmacy or triage team if you have any questions or concerns.

## 2020-04-23 NOTE — Progress Notes (Signed)
Subjective:    Patient ID: Jacqueline Dawson, female    DOB: 08/25/1975, 45 y.o.   MRN: [id number]  CC:  Cabenuva injection - maintenance injection  Lost her dad suddenly recently.    HPI:  Jacqueline Dawson is a 45 y.o. female with well controlled HIV maintained on Cabenuva injections. VL at last visit was undetectable.  Last injection 2/11 400-683m kit. At that visit we decided to submit interval for q216mnd this was approved.  Today she is prepared to get her higher dose for the 2 month injection regimen. She is working with her OB team to evaluate other causes of uterine bleeding (she is s/p ablation d/t fibroids in the past).    Allergies  Allergen Reactions  . Sulfonamide Derivatives Rash      Outpatient Medications Prior to Visit  Medication Sig Dispense Refill  . cabotegravir & rilpivirine ER (CABENUVA) 600 & 900 MG/3ML injection Inject 1 kit monthly for 1 month and then every other month. 6 mL 6  . cholecalciferol (VITAMIN D) 1000 units tablet Take 1,000 Units by mouth daily.    . Multiple Vitamins-Minerals (MULTIVITAMINS THER. W/MINERALS) TABS Take 1 tablet by mouth daily.    . traZODone (DESYREL) 50 MG tablet Take 0.5 tablets (25 mg total) by mouth at bedtime as needed for sleep. 30 tablet 3  . valACYclovir (VALTREX) 500 MG tablet valacyclovir 500 mg tablet  TAKE 1 TABLET BY MOUTH EVERY DAY     No facility-administered medications prior to visit.     Past Medical History:  Diagnosis Date  . Anxiety   . HIV positive ([place]      Past Surgical History:  Procedure Laterality Date  . LAPAROSCOPIC OVARIAN CYSTECTOMY Left 12/13/2015   Procedure: LAPAROSCOPIC OVARIAN CYSTECTOMY;  Surgeon: AnEverett GraffMD;  [facility];  Service: Gynecology;  Laterality: Left;  . TUBAL LIGATION    . WISDOM TOOTH EXTRACTION        Review of Systems  Constitutional: Negative for appetite change, chills and unexpected weight change.  Gastrointestinal: Negative for diarrhea  and nausea.  Neurological: Negative for dizziness and headaches.        Objective:    BP (!) 145/93   Pulse 98   Temp 98.5 F (36.9 C) (Oral)   Wt 120 lb (54.4 kg)   BMI 19.37 kg/m  Nursing note and vital signs reviewed.      Assessment & Plan:   Patient Active Problem List   Diagnosis Date Noted  . HTN (hypertension) 01/27/2018  . Adjustment disorder with anxious mood 01/15/2016  . Insomnia 10/09/2010  . ECTOPIC PREGNANCY 04/02/2007  . Human immunodeficiency virus (HIV) disease ([place]10/03/2005  . Depression 11/11/2005  . ABFND PAP SMEAR HGSIL 11/11/2005    Problem List Items Addressed This Visit      Unprioritized   Human immunodeficiency virus (HIV) disease ([place]    Viral load last month was undetectable. She did well with minor injection site reactions from initial 2 rounds of shots.   Tolerated injections of Cabenuva 600-900 mg well today. Continue with q2m21mjections.   Next Injection: 06/19/2020 - she will also have routine labs at this visit with Dr. HatJohnnye Sima               Meds ordered this encounter  Medications  . cabotegravir & rilpivirine ER (CABENUVA) 600 & 900 MG/3ML injection 1 kit     Follow-up: RTC in 60 days for  maintenance injection. Appt has been scheduled x 2 treatments.     Janene Madeira, MSN, NP-C [facility] for Infectious Disease [name] Pager: [phone] Office: [phone] [place]: [phone]

## 2020-05-04 ENCOUNTER — Other Ambulatory Visit (HOSPITAL_COMMUNITY): Payer: Self-pay

## 2020-05-18 ENCOUNTER — Encounter: Payer: BC Managed Care – PPO | Admitting: Infectious Diseases

## 2020-06-05 NOTE — Progress Notes (Signed)
   Covid-19 Vaccination Clinic  Name:  TERRILL WAUTERS    MRN: [id number] DOB: [birth year]  06/05/2020  Ms. Vassell was observed post Covid-19 immunization for 15 minutes without incident. She was provided with Vaccine Information Sheet and instruction to access the V-Safe system.   Ms. Nuon was instructed to call 911 with any severe reactions post vaccine: Marland Kitchen Difficulty breathing  . Swelling of face and throat  . A fast heartbeat  . A bad rash all over body  . Dizziness and weakness   Immunizations Administered    Name Date Dose VIS Date Route   Moderna COVID-19 Vaccine 01/30/2020 -- -- --   Moderna Covid-19 Booster Vaccine 01/30/2020 11:13 AM 0.25 mL 11/30/2019 Intramuscular   Manufacturer: Moderna   Lot: [id number]   [place]: [id number]

## 2020-06-11 ENCOUNTER — Other Ambulatory Visit (HOSPITAL_COMMUNITY): Payer: Self-pay

## 2020-06-13 ENCOUNTER — Other Ambulatory Visit: Payer: BC Managed Care – PPO

## 2020-06-15 ENCOUNTER — Telehealth: Payer: Self-pay

## 2020-06-15 NOTE — Telephone Encounter (Signed)
[facility] Patient Advocate Encounter  Patient's medication Jacqueline Dawson) have been delivered to [facility] from [name] and will be administered on patient next appointment on 06/19/20.  [name] , [name] Patient [name] for Infectious Disease Phone: [phone] Fax:  [phone]

## 2020-06-19 ENCOUNTER — Other Ambulatory Visit (HOSPITAL_COMMUNITY)
Admission: RE | Admit: 2020-06-19 | Discharge: 2020-06-19 | Disposition: A | Discharge status: Home / Self Care | Payer: BC Managed Care – PPO | Source: Ambulatory Visit | Attending: Infectious Diseases | Admitting: Infectious Diseases

## 2020-06-19 ENCOUNTER — Encounter: Payer: Self-pay | Admitting: Infectious Diseases

## 2020-06-19 ENCOUNTER — Ambulatory Visit (INDEPENDENT_AMBULATORY_CARE_PROVIDER_SITE_OTHER): Payer: BC Managed Care – PPO | Admitting: Infectious Diseases

## 2020-06-19 ENCOUNTER — Other Ambulatory Visit: Payer: Self-pay

## 2020-06-19 VITALS — BP 147/97 | HR 78 | Temp 98.0°F | Wt 127.0 lb

## 2020-06-19 DIAGNOSIS — R87613 High grade squamous intraepithelial lesion on cytologic smear of cervix (HGSIL): Secondary | ICD-10-CM | POA: Diagnosis not present

## 2020-06-19 DIAGNOSIS — Z113 Encounter for screening for infections with a predominantly sexual mode of transmission: Secondary | ICD-10-CM

## 2020-06-19 DIAGNOSIS — Z79899 Other long term (current) drug therapy: Secondary | ICD-10-CM

## 2020-06-19 DIAGNOSIS — B20 Human immunodeficiency virus [HIV] disease: Secondary | ICD-10-CM

## 2020-06-19 DIAGNOSIS — Z0000 Encounter for general adult medical examination without abnormal findings: Secondary | ICD-10-CM

## 2020-06-19 DIAGNOSIS — I10 Essential (primary) hypertension: Secondary | ICD-10-CM

## 2020-06-19 MED ORDER — CABOTEGRAVIR & RILPIVIRINE ER 600 & 900 MG/3ML IM SUER
1.0000 | Freq: Once | INTRAMUSCULAR | Status: AC
  Administered 2020-06-19: 1 via INTRAMUSCULAR

## 2020-06-19 NOTE — Addendum Note (Signed)
Addended by: Daisy Floro T on: 06/19/2020 05:08 PM   Modules accepted: Orders

## 2020-06-19 NOTE — Assessment & Plan Note (Signed)
Attributes to stress Discussed anti-htn rx, wants to defer.  Encouraged exercise.

## 2020-06-19 NOTE — Addendum Note (Signed)
Addended by: Caffie Pinto on: 06/19/2020 05:05 PM   Modules accepted: Orders

## 2020-06-19 NOTE — Assessment & Plan Note (Signed)
She has f/u with GYN Await u/s.  Appreciate GYN f/u.

## 2020-06-19 NOTE — Assessment & Plan Note (Addendum)
Will give her cabaneuva today Return in 2 months for repeat injection.  Offered/refused condoms.  She appears to be doing well Will check her labs today.  Will refer for colon Her vax are up to date rtc in 55 months for visit

## 2020-06-19 NOTE — Progress Notes (Signed)
Subjective:    Patient ID: Jacqueline Dawson, female  DOB: 1975/03/24, 45 y.o.        MRN: [id number]   HPI 45yo F with hx of HIV+ and previous abn PAP/LEEP. 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 She had endometrial bx 05-2015 that was benign. 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  She had endometrial ablation on 01-25-16. Since starting on cabaneuva she has restarted her menses. Painful. Wakes her from sleep. Monthly spotting.  She was seen by GYN (etiology- ovarian cyst, fibroids, cabaneuva) and started on norethindrone.  She thinks it is related to stress.  She is sched for u/s, pending.   Previously was changed from Atripla to Complera to triumeeq to  biktarvy. She was then changed to cabaneuva 01-2020.  Has12and 24yo kids.They are doing well. Has a grandbaby now(4 yr old in June).  Works for [employer].  She had diagnostic mammo due to "spot" that was (-) 12-2017.October 2020(-), done at work. November 2021 (-) at work.    Getting married! Fiance is (-). Getting married in November 2022. Has condoms from work. Does not want prep.   HIV 1 RNA Quant  Date Value  03/23/2020 <20 Copies/mL (H)  01/16/2020 4,920 Copies/mL (H)  05/27/2019 23 copies/mL (H)   CD4 T Cell Abs (/uL)  Date Value  01/16/2020 299 (L)  05/27/2019 523  07/29/2018 497     Health Maintenance  Topic Date Due  . PAP SMEAR-Modifier  04/22/2018  . COLONOSCOPY (Pts 45-61yrs Insurance coverage will need to be confirmed)  Never done  . COVID-19 Vaccine (4 - Booster for Moderna series) 07/30/2020  . INFLUENZA VACCINE  09/10/2020  . TETANUS/TDAP  05/07/2027  . Hepatitis C Screening  Completed  . HIV Screening  Completed  . HPV VACCINES  Aged Out      Review of Systems  Constitutional: Negative for chills, fever and weight loss.  Respiratory: Negative for cough and shortness of breath.   Cardiovascular: Negative for chest pain.  Gastrointestinal:  Negative for constipation.  Genitourinary: Negative for dysuria.  Musculoskeletal: Positive for back pain (r flank that radiates into her back and groin).  Neurological: Negative for dizziness and headaches.  Endo/Heme/Allergies: Does not bruise/bleed easily.  Psychiatric/Behavioral: The patient has insomnia (takes trazadone).   doesn't take naprosyn daily, prn for pain.   Please see HPI. All other systems reviewed and negative.     Objective:  Physical Exam Vitals reviewed.  Constitutional:      Appearance: Normal appearance.  HENT:     Mouth/Throat:     Mouth: Mucous membranes are moist.     Pharynx: No oropharyngeal exudate.  Eyes:     Extraocular Movements: Extraocular movements intact.     Pupils: Pupils are equal, round, and reactive to light.  Cardiovascular:     Rate and Rhythm: Normal rate and regular rhythm.  Pulmonary:     Effort: Pulmonary effort is normal.     Breath sounds: Normal breath sounds.  Abdominal:     General: Bowel sounds are normal. There is no distension.     Palpations: Abdomen is soft.     Tenderness: There is no abdominal tenderness. There is no right CVA tenderness or left CVA tenderness.  Musculoskeletal:        General: Normal range of motion.     Cervical back: Normal range of motion and neck supple.     Right lower leg: No edema.  Left lower leg: No edema.  Skin:    General: Skin is warm.  Neurological:     General: No focal deficit present.     Mental Status: She is alert.  Psychiatric:        Mood and Affect: Mood normal.            Assessment & Plan:

## 2020-06-20 LAB — T-HELPER CELL (CD4) - (RCID CLINIC ONLY)
CD4 % Helper T Cell: 26 % — ABNORMAL LOW (ref 33–65)
CD4 T Cell Abs: 464 /uL (ref 400–1790)

## 2020-06-20 NOTE — Addendum Note (Signed)
Addended by: Theo Krumholz C on: 06/20/2020 10:35 AM   Modules accepted: Orders

## 2020-06-21 LAB — URINE CYTOLOGY ANCILLARY ONLY
Chlamydia: NEGATIVE
Comment: NEGATIVE
Comment: NORMAL
Neisseria Gonorrhea: NEGATIVE

## 2020-06-22 LAB — RPR: RPR Ser Ql: NONREACTIVE

## 2020-06-22 LAB — COMPREHENSIVE METABOLIC PANEL
AG Ratio: 1.4 (calc) (ref 1.0–2.5)
ALT: 8 U/L (ref 6–29)
AST: 13 U/L (ref 10–35)
Albumin: 4.2 g/dL (ref 3.6–5.1)
Alkaline phosphatase (APISO): 29 U/L — ABNORMAL LOW (ref 31–125)
BUN: 12 mg/dL (ref 7–25)
CO2: 25 mmol/L (ref 20–32)
Calcium: 9.1 mg/dL (ref 8.6–10.2)
Chloride: 105 mmol/L (ref 98–110)
Creat: 0.9 mg/dL (ref 0.50–1.10)
Globulin: 2.9 g/dL (calc) (ref 1.9–3.7)
Glucose, Bld: 78 mg/dL (ref 65–99)
Potassium: 3.9 mmol/L (ref 3.5–5.3)
Sodium: 138 mmol/L (ref 135–146)
Total Bilirubin: 0.2 mg/dL (ref 0.2–1.2)
Total Protein: 7.1 g/dL (ref 6.1–8.1)

## 2020-06-22 LAB — CBC
HCT: 38.1 % (ref 35.0–45.0)
Hemoglobin: 12.4 g/dL (ref 11.7–15.5)
MCH: 27.7 pg (ref 27.0–33.0)
MCHC: 32.5 g/dL (ref 32.0–36.0)
MCV: 85.2 fL (ref 80.0–100.0)
MPV: 10.4 fL (ref 7.5–12.5)
Platelets: 242 10*3/uL (ref 140–400)
RBC: 4.47 10*6/uL (ref 3.80–5.10)
RDW: 11.9 % (ref 11.0–15.0)
WBC: 6.1 10*3/uL (ref 3.8–10.8)

## 2020-06-22 LAB — LIPID PANEL
Cholesterol: 146 mg/dL (ref <200)
HDL: 52 mg/dL (ref >=50)
LDL Cholesterol (Calc): 69 mg/dL (calc)
Non-HDL Cholesterol (Calc): 94 mg/dL (calc) (ref <130)
Total CHOL/HDL Ratio: 2.8 (calc) (ref <5.0)
Triglycerides: 173 mg/dL — ABNORMAL HIGH (ref <150)

## 2020-06-22 LAB — HIV-1 RNA QUANT-NO REFLEX-BLD
HIV 1 RNA Quant: NOT DETECTED Copies/mL
HIV-1 RNA Quant, Log: NOT DETECTED Log cps/mL

## 2020-06-28 ENCOUNTER — Encounter: Payer: BC Managed Care – PPO | Admitting: Infectious Diseases

## 2020-07-23 ENCOUNTER — Encounter: Payer: BC Managed Care – PPO | Admitting: Family

## 2020-07-28 ENCOUNTER — Other Ambulatory Visit: Payer: Self-pay | Admitting: Infectious Diseases

## 2020-07-28 DIAGNOSIS — G4700 Insomnia, unspecified: Secondary | ICD-10-CM

## 2020-08-07 ENCOUNTER — Telehealth: Payer: Self-pay

## 2020-08-07 NOTE — Telephone Encounter (Signed)
[facility] Patient Advocate Encounter  [facility] and I have been unsuccsessful in reaching patient to be able to refill medication.  Kern Reap)  We have tried multiple times without a response.  [name], [facility] Patient [facility] for Infectious Disease Phone: [phone] Fax:  [phone]

## 2020-08-10 ENCOUNTER — Ambulatory Visit: Payer: BC Managed Care – PPO | Attending: Family

## 2020-08-10 DIAGNOSIS — Z23 Encounter for immunization: Secondary | ICD-10-CM

## 2020-08-10 NOTE — Progress Notes (Signed)
   Covid-19 Vaccination Clinic  Name:  KASSADIE PANCAKE    MRN: [id number] DOB: [birth year]  08/10/2020  Ms. Sprankle was observed post Covid-19 immunization for 15 minutes without incident. She was provided with Vaccine Information Sheet and instruction to access the V-Safe system.   Ms. Kuklinski was instructed to call 911 with any severe reactions post vaccine: Difficulty breathing  Swelling of face and throat  A fast heartbeat  A bad rash all over body  Dizziness and weakness   Immunizations Administered     Name Date Dose VIS Date Route   Moderna Covid-19 Booster Vaccine 08/10/2020  1:34 PM 0.25 mL 11/30/2019 Intramuscular   Manufacturer: Moderna   Lot: [id number]   [place]: [id number]

## 2020-08-15 ENCOUNTER — Telehealth: Payer: Self-pay

## 2020-08-15 ENCOUNTER — Encounter: Payer: Self-pay | Admitting: Gastroenterology

## 2020-08-15 NOTE — Telephone Encounter (Signed)
[facility] Patient Advocate Encounter  Patient's medication Kern Reap) have been [facility] and will be administered on patient next office visit on 08/16/20.  [name] , [facility] Patient [facility] for Infectious Disease Phone: [phone] Fax:  [phone]

## 2020-08-16 ENCOUNTER — Encounter: Payer: Self-pay | Admitting: Infectious Diseases

## 2020-08-16 ENCOUNTER — Ambulatory Visit (INDEPENDENT_AMBULATORY_CARE_PROVIDER_SITE_OTHER): Payer: BC Managed Care – PPO | Admitting: Infectious Diseases

## 2020-08-16 ENCOUNTER — Other Ambulatory Visit: Payer: Self-pay

## 2020-08-16 DIAGNOSIS — B20 Human immunodeficiency virus [HIV] disease: Secondary | ICD-10-CM

## 2020-08-16 DIAGNOSIS — D219 Benign neoplasm of connective and other soft tissue, unspecified: Secondary | ICD-10-CM | POA: Diagnosis not present

## 2020-08-16 MED ORDER — CABOTEGRAVIR & RILPIVIRINE ER 600 & 900 MG/3ML IM SUER
1.0000 | Freq: Once | INTRAMUSCULAR | Status: AC
Start: 2020-08-16 — End: 2020-08-16
  Administered 2020-08-16: 1 via INTRAMUSCULAR

## 2020-08-16 NOTE — Assessment & Plan Note (Signed)
Appreciate Dr Mancel Bale f/u.  We spoke at length regarding possible procedure, her concept of "major surgery", the balance of benefits of decreasing her pain, bleeding, staying on estrogen.

## 2020-08-16 NOTE — Assessment & Plan Note (Signed)
She is doing very well Next cabaneuva injection today.  Check VL today.  Will see her back in sept and check full lab panel.

## 2020-08-16 NOTE — Progress Notes (Signed)
Subjective:    Patient ID: Jacqueline Dawson, female  DOB: Aug 21, 1975, 45 y.o.        MRN: [id number]   HPI 45 yo F with hx of HIV+ and previous abn PAP/LEEP. 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  She had endometrial bx 05-2015 that was benign. 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 She had endometrial ablation on 01-25-16.   Previously was changed from Atripla to Complera to triumeeq to  biktarvy. She was then changed to cabaneuva 01-2020.  She developed vaginal spotting and pelvic pain after this- u/s- cysts on her ovaries, fibroids. She has f/u with Dr Mancel Bale. She was started on norethedrine and naproxen. Taking less naproxen than prev.  Otherwise she likes the cabanueva very much.    Has 35 and 37 yo kids. They are doing well.  Has a grandbaby now (29 yr old in June). Works for [employer].    She had diagnostic mammo due to "spot" that was (-) 12-2017. October 2020 (-), done at work. November 2021 (-) at work.    Getting married! Fiance is (-). Getting married in December 21, 2020. Has condoms from work. Does not want prep.   November 04 2020 colonoscopy scheduled.    HIV 1 RNA Quant (Copies/mL)  Date Value  06/19/2020 Not Detected  03/23/2020 <20 (H)  01/16/2020 4,920 (H)   CD4 T Cell Abs (/uL)  Date Value  06/19/2020 464  01/16/2020 299 (L)  05/27/2019 523     Health Maintenance  Topic Date Due  . Pneumococcal Vaccine 65-51 Years old (1 - PCV) Never done  . PAP SMEAR-Modifier  04/22/2018  . COLONOSCOPY (Pts 45-37yrs Insurance coverage will need to be confirmed)  Never done  . INFLUENZA VACCINE  09/10/2020  . TETANUS/TDAP  05/07/2027  . COVID-19 Vaccine  Completed  . Hepatitis C Screening  Completed  . HIV Screening  Completed  . HPV VACCINES  Aged Out      Review of Systems  Constitutional:  Negative for chills, fever and weight loss.  Respiratory:  Negative for cough and shortness of breath.   Gastrointestinal:   Negative for constipation and diarrhea.  Genitourinary:  Negative for dysuria.   Please see HPI. All other systems reviewed and negative.     Objective:  Physical Exam Vitals reviewed.  Constitutional:      General: She is not in acute distress.    Appearance: Normal appearance. She is not ill-appearing or toxic-appearing.  HENT:     Mouth/Throat:     Pharynx: No oropharyngeal exudate.  Eyes:     Extraocular Movements: Extraocular movements intact.     Pupils: Pupils are equal, round, and reactive to light.  Cardiovascular:     Rate and Rhythm: Normal rate and regular rhythm.  Pulmonary:     Effort: Pulmonary effort is normal.     Breath sounds: Normal breath sounds.  Abdominal:     General: Bowel sounds are normal. There is distension (minimal).     Palpations: Abdomen is soft.     Tenderness: There is no abdominal tenderness.  Musculoskeletal:        General: Normal range of motion.     Cervical back: Normal range of motion and neck supple.     Right lower leg: No edema.     Left lower leg: No edema.  Neurological:     General: No focal deficit present.     Mental Status:  She is alert.          Assessment & Plan:

## 2020-08-18 LAB — HIV-1 RNA QUANT-NO REFLEX-BLD
HIV 1 RNA Quant: NOT DETECTED Copies/mL
HIV-1 RNA Quant, Log: NOT DETECTED Log cps/mL

## 2020-08-21 ENCOUNTER — Encounter: Payer: BC Managed Care – PPO | Admitting: Infectious Diseases

## 2020-09-24 ENCOUNTER — Other Ambulatory Visit: Payer: Self-pay | Admitting: Infectious Diseases

## 2020-09-24 DIAGNOSIS — G4700 Insomnia, unspecified: Secondary | ICD-10-CM

## 2020-09-24 MED ORDER — TRAZODONE HCL 50 MG PO TABS: ORAL_TABLET | ORAL | 3 refills | Status: DC

## 2020-10-16 ENCOUNTER — Other Ambulatory Visit: Payer: Self-pay | Admitting: Infectious Diseases

## 2020-10-16 DIAGNOSIS — G4700 Insomnia, unspecified: Secondary | ICD-10-CM

## 2020-10-17 ENCOUNTER — Telehealth: Payer: Self-pay

## 2020-10-17 NOTE — Telephone Encounter (Signed)
[facility] Patient Advocate Encounter  Patient's medication Kern Reap) have been couriered to [facility] from [facility] and will be administered on patient next office visit on 10/23/20.  [name] , [facility] Patient [facility] for Infectious Disease Phone: [phone] Fax:  [phone]

## 2020-10-18 ENCOUNTER — Encounter: Payer: BC Managed Care – PPO | Admitting: Infectious Diseases

## 2020-10-22 ENCOUNTER — Ambulatory Visit (AMBULATORY_SURGERY_CENTER): Payer: BC Managed Care – PPO | Admitting: *Deleted

## 2020-10-22 ENCOUNTER — Other Ambulatory Visit: Payer: Self-pay

## 2020-10-22 ENCOUNTER — Telehealth: Payer: Self-pay | Admitting: *Deleted

## 2020-10-22 VITALS — Ht 66.0 in | Wt 128.0 lb

## 2020-10-22 DIAGNOSIS — Z1211 Encounter for screening for malignant neoplasm of colon: Secondary | ICD-10-CM

## 2020-10-22 NOTE — Telephone Encounter (Signed)
Patient was called at 432 pm today for PV over the phone. No answer, left message for the patient to return my call before 5 pm.  Patient was called again at 440 pm. Left message for the patient to call me back before 5 pm.

## 2020-10-22 NOTE — Telephone Encounter (Signed)
Patient called back. Previsit done.

## 2020-10-22 NOTE — Progress Notes (Signed)

## 2020-10-23 ENCOUNTER — Encounter: Payer: Self-pay | Admitting: Gastroenterology

## 2020-10-23 ENCOUNTER — Other Ambulatory Visit: Payer: Self-pay

## 2020-10-23 ENCOUNTER — Encounter: Payer: Self-pay | Admitting: Family

## 2020-10-23 ENCOUNTER — Ambulatory Visit (INDEPENDENT_AMBULATORY_CARE_PROVIDER_SITE_OTHER): Payer: BC Managed Care – PPO | Admitting: Family

## 2020-10-23 VITALS — BP 124/89 | HR 88 | Temp 98.3°F | Ht 66.0 in | Wt 129.0 lb

## 2020-10-23 DIAGNOSIS — B20 Human immunodeficiency virus [HIV] disease: Secondary | ICD-10-CM | POA: Diagnosis not present

## 2020-10-23 DIAGNOSIS — G4700 Insomnia, unspecified: Secondary | ICD-10-CM | POA: Diagnosis not present

## 2020-10-23 MED ORDER — HYDROXYZINE HCL 25 MG PO TABS: 25.0000 mg | ORAL_TABLET | Freq: Three times a day (TID) | ORAL | 0 refills | Status: DC | PRN

## 2020-10-23 MED ORDER — CABOTEGRAVIR & RILPIVIRINE ER 600 & 900 MG/3ML IM SUER
1.0000 | Freq: Once | INTRAMUSCULAR | Status: AC
  Administered 2020-10-23: 1 via INTRAMUSCULAR

## 2020-10-23 NOTE — Patient Instructions (Addendum)
Nice to see you.  We will check your lab work today.  Take the hydroxyzine as needed. Be cautious as there is always the chance it may make you sleepy/drowsy so do not operate heavy machinery until you know how the medication will effect you.  Plan for follow up in 2 months or sooner if needed.   Have a great day and stay safe!

## 2020-10-23 NOTE — Assessment & Plan Note (Signed)
Jacqueline Dawson sleep has improved with the addition of trazodone although continues to have significant amount of stress/anxiety.  Discussed importance of stress relief and taking time for herself.  Recommended counseling.  We will try Vistaril as needed for stress/anxiety.  Avoid benzodiazepines as this stress is likely temporary secondary to multiple stressors

## 2020-10-23 NOTE — Progress Notes (Signed)
Patient ID: Jacqueline Dawson, female    DOB: 1975/12/16, 45 y.o.   MRN: [id number]  Subjective:    Chief Complaint  Patient presents with   Follow-up    Cabenuva     HPI:  Jacqueline Dawson is a 45 y.o. female to be disease last seen on 08/16/2020 with well-controlled virus and good tolerance to her every 2 month injection of Cabenuva.  Viral load was undetectable.  Here today for every 2 month injection.  Jacqueline Dawson has had no problems since her last injection. Describes the medication as the "best thing ever".  Soreness for a couple of days following injections managable with OTC medication. Overall feeling well today with concern for increased stress/anxiety related to multiple life stressors including work and planning a wedding. Trazodone is helping her to sleep some. Asking if any other medications can be prescribed as she feels very wound up at times and has come off as moody and irritable. Symptoms are generally worse at night as she stays busy at work during the day. Fiance has been going to the gym and is considering joining with him.    Allergies  Allergen Reactions   Sulfonamide Derivatives Rash      Outpatient Medications Prior to Visit  Medication Sig Dispense Refill   cabotegravir & rilpivirine ER (CABENUVA) 600 & 900 MG/3ML injection Inject 1 kit monthly for 1 month and then every other month. 6 mL 6   ketoconazole (NIZORAL) 2 % shampoo ketoconazole 2 % shampoo     Multiple Vitamins-Minerals (MULTIVITAMINS THER. W/MINERALS) TABS Take 1 tablet by mouth daily.     naproxen (NAPROSYN) 500 MG tablet naproxen 500 mg tablet  Take 1 tablet every 12 hours by oral route as needed.     norethindrone (AYGESTIN) 5 MG tablet norethindrone acetate 5 mg tablet  TAKE 2 TABLETS BY MOUTH EVERY DAY     traZODone (DESYREL) 50 MG tablet TAKE 1/2 TABLET (25 MG TOTAL) BY MOUTH AT BEDTIME AS NEEDED FOR SLEEP. 15 tablet 3   triamcinolone (KENALOG) 0.025 % cream SMARTSIG:2 Topical Twice Daily      valACYclovir (VALTREX) 500 MG tablet valacyclovir 500 mg tablet  TAKE 1 TABLET BY MOUTH EVERY DAY     No facility-administered medications prior to visit.     Past Medical History:  Diagnosis Date   Anxiety    Depression    HIV positive ([place])      Past Surgical History:  Procedure Laterality Date   LAPAROSCOPIC OVARIAN CYSTECTOMY Left 12/13/2015   Procedure: LAPAROSCOPIC OVARIAN CYSTECTOMY;  Surgeon: Everett Graff, MD;  Location: [facility];  Service: Gynecology;  Laterality: Left;   TUBAL LIGATION     WISDOM TOOTH EXTRACTION        Review of Systems  Constitutional:  Negative for appetite change, chills, diaphoresis, fatigue, fever and unexpected weight change.  Eyes:        Negative for acute change in vision  Respiratory:  Negative for chest tightness, shortness of breath and wheezing.   Cardiovascular:  Negative for chest pain.  Gastrointestinal:  Negative for diarrhea, nausea and vomiting.  Genitourinary:  Negative for dysuria, pelvic pain and vaginal discharge.  Musculoskeletal:  Negative for neck pain and neck stiffness.  Skin:  Negative for rash.  Neurological:  Negative for seizures, syncope, weakness and headaches.  Hematological:  Negative for adenopathy. Does not bruise/bleed easily.  Psychiatric/Behavioral:  Positive for sleep disturbance. Negative for hallucinations. The patient is nervous/anxious.  Objective:    BP 124/89   Pulse 88   Temp 98.3 F (36.8 C) (Oral)   Ht _0  (1.676 m)   Wt 129 lb (58.5 kg)   SpO2 98%   BMI 20.82 kg/m  Nursing note and vital signs reviewed.  Physical Exam Constitutional:      General: She is not in acute distress.    Appearance: She is well-developed.  Eyes:     Conjunctiva/sclera: Conjunctivae normal.  Cardiovascular:     Rate and Rhythm: Normal rate and regular rhythm.     Heart sounds: Normal heart sounds. No murmur heard.   No friction rub. No gallop.  Pulmonary:     Effort: Pulmonary effort is  normal. No respiratory distress.     Breath sounds: Normal breath sounds. No wheezing or rales.  Chest:     Chest wall: No tenderness.  Abdominal:     General: Bowel sounds are normal.     Palpations: Abdomen is soft.     Tenderness: There is no abdominal tenderness.  Musculoskeletal:     Cervical back: Neck supple.  Lymphadenopathy:     Cervical: No cervical adenopathy.  Skin:    General: Skin is warm and dry.     Findings: No rash.  Neurological:     Mental Status: She is alert and oriented to person, place, and time.  Psychiatric:        Behavior: Behavior normal.        Thought Content: Thought content normal.        Judgment: Judgment normal.     Depression screen [facility] 2/9 10/23/2020 08/16/2020 01/31/2020 01/27/2018 05/06/2017  Decreased Interest 0 0 0 0 0  Down, Depressed, Hopeless 0 0 0 0 1  PHQ - 2 Score 0 0 0 0 1  Altered sleeping - - - - -  Tired, decreased energy - - - - -  Change in appetite - - - - -  Feeling bad or failure about yourself  - - - - -  Trouble concentrating - - - - -  Moving slowly or fidgety/restless - - - - -  Suicidal thoughts - - - - -  PHQ-9 Score - - - - -  Difficult doing work/chores - - - - -  Some recent data might be hidden       Assessment & Plan:    Patient Active Problem List   Diagnosis Date Noted   Fibroids 08/16/2020   HTN (hypertension) 01/27/2018   Adjustment disorder with anxious mood 01/15/2016   Insomnia 10/09/2010   ECTOPIC PREGNANCY 04/02/2007   Human immunodeficiency virus (HIV) disease ([place]) 11/11/2005   Depression 11/11/2005   ABFND PAP SMEAR HGSIL 11/11/2005     Problem List Items Addressed This Visit       Other   Human immunodeficiency virus (HIV) disease ([place]) - Primary    Patient continues to have well-controlled virus with good adherence/tolerance to [place].  Check lab work today.  Every 2 month a dose of Cabenuva provided with no complications.  Plan for follow-up in 2 months or sooner if needed.       Relevant Medications   ketoconazole (NIZORAL) 2 % shampoo   Other Relevant Orders   T-helper cell (CD4)- ([facility] clinic only)   HIV-1 RNA quant-no reflex-bld   Insomnia    Ms. Voytko's sleep has improved with the addition of trazodone although continues to have significant amount of stress/anxiety.  Discussed importance of stress relief and  taking time for herself.  Recommended counseling.  We will try Vistaril as needed for stress/anxiety.  Avoid benzodiazepines as this stress is likely temporary secondary to multiple stressors        I am having Jacqueline Dawson start on hydrOXYzine. I am also having her maintain her multivitamins ther. w/minerals, valACYclovir, cabotegravir & rilpivirine ER, norethindrone, traZODone, naproxen, triamcinolone, and ketoconazole. We administered cabotegravir & rilpivirine ER.   Meds ordered this encounter  Medications   hydrOXYzine (ATARAX/VISTARIL) 25 MG tablet    Sig: Take 1-2 tablets (25-50 mg total) by mouth 3 (three) times daily as needed.    Dispense:  30 tablet    Refill:  0    Order Specific Question:   Supervising Provider    Answer:   Baxter Flattery, CYNTHIA [[id number]]   cabotegravir & rilpivirine ER (CABENUVA) 600 & 900 MG/3ML injection 1 kit   A total of 31 minutes was spent on this visit reviewing previous notes, counseling the patient on stress management, stress management techniques, and medication counseling, adjusting meds, and documenting the findings in the note   Follow-up: 2 months or sooner if needed.    Terri Piedra, MSN, FNP-C Nurse Practitioner [facility] [place] number: [phone]

## 2020-10-23 NOTE — Assessment & Plan Note (Signed)
Patient continues to have well-controlled virus with good adherence/tolerance to [place].  Check lab work today.  Every 2 month a dose of Cabenuva provided with no complications.  Plan for follow-up in 2 months or sooner if needed.

## 2020-10-24 LAB — T-HELPER CELL (CD4) - (RCID CLINIC ONLY)
CD4 % Helper T Cell: 25 % — ABNORMAL LOW (ref 33–65)
CD4 T Cell Abs: 428 /uL (ref 400–1790)

## 2020-10-25 LAB — HIV-1 RNA QUANT-NO REFLEX-BLD
HIV 1 RNA Quant: <20 Copies/mL — ABNORMAL HIGH
HIV-1 RNA Quant, Log: <1.3 Log cps/mL — ABNORMAL HIGH

## 2020-11-05 ENCOUNTER — Other Ambulatory Visit: Payer: Self-pay

## 2020-11-05 ENCOUNTER — Ambulatory Visit (AMBULATORY_SURGERY_CENTER): Payer: BC Managed Care – PPO | Admitting: Gastroenterology

## 2020-11-05 ENCOUNTER — Encounter: Payer: Self-pay | Admitting: Gastroenterology

## 2020-11-05 VITALS — BP 137/98 | HR 84 | Temp 98.6°F | Resp 17 | Ht 66.0 in | Wt 128.0 lb

## 2020-11-05 DIAGNOSIS — Z1211 Encounter for screening for malignant neoplasm of colon: Secondary | ICD-10-CM | POA: Diagnosis not present

## 2020-11-05 HISTORY — PX: COLONOSCOPY: SHX174

## 2020-11-05 MED ORDER — SODIUM CHLORIDE 0.9 % IV SOLN: 500.0000 mL | Freq: Once | INTRAVENOUS | Status: DC

## 2020-11-05 NOTE — Patient Instructions (Signed)
Resume previous diet and continue present medications. Repeat Colonoscopy in 10 years for surveillance.  YOU HAD AN ENDOSCOPIC PROCEDURE TODAY AT [facility]:   Refer to the procedure report that was given to you for any specific questions about what was found during the examination.  If the procedure report does not answer your questions, please call your gastroenterologist to clarify.  If you requested that your care partner not be given the details of your procedure findings, then the procedure report has been included in a sealed envelope for you to review at your convenience later.  YOU SHOULD EXPECT: Some feelings of bloating in the abdomen. Passage of more gas than usual.  Walking can help get rid of the air that was put into your GI tract during the procedure and reduce the bloating. If you had a lower endoscopy (such as a colonoscopy or flexible sigmoidoscopy) you may notice spotting of blood in your stool or on the toilet paper. If you underwent a bowel prep for your procedure, you may not have a normal bowel movement for a few days.  Please Note:  You might notice some irritation and congestion in your nose or some drainage.  This is from the oxygen used during your procedure.  There is no need for concern and it should clear up in a day or so.  SYMPTOMS TO REPORT IMMEDIATELY:  Following lower endoscopy (colonoscopy or flexible sigmoidoscopy):  Excessive amounts of blood in the stool  Significant tenderness or worsening of abdominal pains  Swelling of the abdomen that is new, acute  Fever of 100F or higher  For urgent or emergent issues, a gastroenterologist can be reached at any hour by calling [phone]. Do not use MyChart messaging for urgent concerns.    DIET:  We do recommend a small meal at first, but then you may proceed to your regular diet.  Drink plenty of fluids but you should avoid alcoholic beverages for 24 hours.  ACTIVITY:  You should plan to  take it easy for the rest of today and you should NOT DRIVE or use heavy machinery until tomorrow (because of the sedation medicines used during the test).    FOLLOW UP: Our staff will call the number listed on your records 48-72 hours following your procedure to check on you and address any questions or concerns that you may have regarding the information given to you following your procedure. If we do not reach you, we will leave a message.  We will attempt to reach you two times.  During this call, we will ask if you have developed any symptoms of COVID 19. If you develop any symptoms (ie: fever, flu-like symptoms, shortness of breath, cough etc.) before then, please call [phone].  If you test positive for Covid 19 in the 2 weeks post procedure, please call and report this information to [place].    If any biopsies were taken you will be contacted by phone or by letter within the next 1-3 weeks.  Please call us at [phone] if you have not heard about the biopsies in 3 weeks.    SIGNATURES/CONFIDENTIALITY: You and/or your care partner have signed paperwork which will be entered into your electronic medical record.  These signatures attest to the fact that that the information above on your After Visit Summary has been reviewed and is understood.  Full responsibility of the confidentiality of this discharge information lies with you and/or your care-partner.

## 2020-11-05 NOTE — Progress Notes (Signed)
Pt's states no medical or surgical changes since previsit or office visit.   Vs DT

## 2020-11-05 NOTE — Progress Notes (Signed)
Referring Provider: Campbell Riches, MD Primary Care Physician:  Campbell Riches, MD  Reason for Procedure:  Colon cancer screening   IMPRESSION:  Need for colon cancer screening  PLAN: Colonoscopy in the [place] today   HPI: Jacqueline Dawson is a 45 y.o. female presents for screening colonoscopy.  No prior colonoscopy or colon cancer screening.  No baseline GI symptoms.   No known family history of colon cancer or polyps. No family history of uterine/endometrial cancer, pancreatic cancer or gastric/stomach cancer.   Past Medical History:  Diagnosis Date   Anxiety    Depression    HIV positive ([place])     Past Surgical History:  Procedure Laterality Date   COLONOSCOPY  11/05/2020   LAPAROSCOPIC OVARIAN CYSTECTOMY Left 12/13/2015   Procedure: LAPAROSCOPIC OVARIAN CYSTECTOMY;  Surgeon: Everett Graff, MD;  Location: [facility];  Service: Gynecology;  Laterality: Left;   TUBAL LIGATION     WISDOM TOOTH EXTRACTION      Current Outpatient Medications  Medication Sig Dispense Refill   hydrOXYzine (ATARAX/VISTARIL) 25 MG tablet Take 1-2 tablets (25-50 mg total) by mouth 3 (three) times daily as needed. 30 tablet 0   ketoconazole (NIZORAL) 2 % shampoo ketoconazole 2 % shampoo     naproxen (NAPROSYN) 500 MG tablet naproxen 500 mg tablet  Take 1 tablet every 12 hours by oral route as needed.     traZODone (DESYREL) 50 MG tablet TAKE 1/2 TABLET (25 MG TOTAL) BY MOUTH AT BEDTIME AS NEEDED FOR SLEEP. 15 tablet 3   cabotegravir & rilpivirine ER (CABENUVA) 600 & 900 MG/3ML injection Inject 1 kit monthly for 1 month and then every other month. 6 mL 6   Multiple Vitamins-Minerals (MULTIVITAMINS THER. W/MINERALS) TABS Take 1 tablet by mouth daily.     norethindrone (AYGESTIN) 5 MG tablet norethindrone acetate 5 mg tablet  TAKE 2 TABLETS BY MOUTH EVERY DAY (Patient not taking: Reported on 11/05/2020)     Relugolix-Estradiol-Norethind (MYFEMBREE) 40-1-0.5 MG TABS Myfembree 40 mg-1 mg-0.5  mg tablet  Take 1 tablet every day by oral route. (Patient not taking: Reported on 11/05/2020)     triamcinolone (KENALOG) 0.025 % cream SMARTSIG:2 Topical Twice Daily     valACYclovir (VALTREX) 500 MG tablet valacyclovir 500 mg tablet  TAKE 1 TABLET BY MOUTH EVERY DAY     Current Facility-Administered Medications  Medication Dose Route Frequency Provider Last Rate Last Admin   0.9 %  sodium chloride infusion  500 mL Intravenous Once Thornton Park, MD        Allergies as of 11/05/2020 - Review Complete 11/05/2020  Allergen Reaction Noted   Sulfonamide derivatives Rash 11/11/2005    Family History  Problem Relation Age of Onset   Colon polyps Mother    Hypertension Mother    Colon cancer Maternal Aunt    Cancer - Cervical Maternal Aunt    Esophageal cancer Neg Hx    Rectal cancer Neg Hx    Stomach cancer Neg Hx      Physical Exam: General:   Alert,  well-nourished, pleasant and cooperative in NAD Head:  Normocephalic and atraumatic. Eyes:  Sclera clear, no icterus.   Conjunctiva pink. Mouth:  No deformity or lesions.   Neck:  Supple; no masses or thyromegaly. Lungs:  Clear throughout to auscultation.   No wheezes. Heart:  Regular rate and rhythm; no murmurs. Abdomen:  Soft, non-tender, nondistended, normal bowel sounds, no rebound or guarding.  Msk:  Symmetrical. No boney deformities LAD: No  inguinal or umbilical LAD Extremities:  No clubbing or edema. Neurologic:  Alert and  oriented x4;  grossly nonfocal Skin:  No obvious rash or bruise. Psych:  Alert and cooperative. Normal mood and affect.    Tanette Chauca L. Tarri Glenn, MD, MPH 11/05/2020, 11:40 AM

## 2020-11-05 NOTE — Op Note (Signed)
[facility] Patient Name: Jacqueline Dawson Procedure Date: 11/05/2020 11:43 AM MRN: [id number] Endoscopist: Thornton Park MD, MD Age: 45 Referring MD:  Date of Birth: 09/19/1975 Gender: Female Account #: [id number] Procedure:                Colonoscopy Indications:              Screening for colorectal malignant neoplasm, This                            is the patient's first colonoscopy                           No known family history of colon cancer or polyps Medicines:                Monitored Anesthesia Care Procedure:                Pre-Anesthesia Assessment:                           - Prior to the procedure, a History and Physical                            was performed, and patient medications and                            allergies were reviewed. The patient's tolerance of                            previous anesthesia was also reviewed. The risks                            and benefits of the procedure and the sedation                            options and risks were discussed with the patient.                            All questions were answered, and informed consent                            was obtained. Prior Anticoagulants: The patient has                            taken no previous anticoagulant or antiplatelet                            agents. ASA Grade Assessment: II - A patient with                            mild systemic disease. After reviewing the risks                            and benefits, the patient was deemed in  satisfactory condition to undergo the procedure.                           After obtaining informed consent, the colonoscope                            was passed under direct vision. Throughout the                            procedure, the patient's blood pressure, pulse, and                            oxygen saturations were monitored continuously. The                            Olympus PCF-[id number]  (#[id number]) Colonoscope was                            introduced through the anus and advanced to the 3                            cm into the ileum. A second forward view of the                            right colon was performed. The colonoscopy was                            performed without difficulty. The patient tolerated                            the procedure well. The quality of the bowel                            preparation was good. The terminal ileum, ileocecal                            valve, appendiceal orifice, and rectum were                            photographed. Scope In: 11:50:08 AM Scope Out: 12:00:31 PM Scope Withdrawal Time: 0 hours 8 minutes 26 seconds  Total Procedure Duration: 0 hours 10 minutes 23 seconds  Findings:                 The perianal and digital rectal examinations were                            normal.                           The entire examined colon appeared normal on direct                            and retroflexion views. Complications:            No immediate  complications. Estimated Blood Loss:     Estimated blood loss: none. Impression:               - The entire examined colon is normal on direct and                            retroflexion views.                           - No specimens collected. Recommendation:           - Patient has a contact number available for                            emergencies. The signs and symptoms of potential                            delayed complications were discussed with the                            patient. Return to normal activities tomorrow.                            Written discharge instructions were provided to the                            patient.                           - Resume previous diet.                           - Continue present medications.                           - Repeat colonoscopy in 10 years for surveillance,                            earlier with new  symptoms.                           - Emerging evidence supports eating a diet of                            fruits, vegetables, grains, calcium, and yogurt                            while reducing red meat and alcohol may reduce the                            risk of colon cancer.                           - Thank you for allowing me to be involved in your  colon cancer prevention. Thornton Park MD, MD 11/05/2020 12:05:29 PM This report has been signed electronically.

## 2020-11-05 NOTE — Progress Notes (Signed)
PT taken to PACU. Monitors in place. VSS. Report given to RN. 

## 2020-11-07 ENCOUNTER — Telehealth: Payer: Self-pay | Admitting: *Deleted

## 2020-11-07 NOTE — Telephone Encounter (Signed)
  Follow up Call-  Call back number 11/05/2020  Post procedure Call Back phone  # [phone]  Permission to leave phone message Yes  Some recent data might be hidden     Patient questions:  Message left to call us if necessary.

## 2020-11-07 NOTE — Telephone Encounter (Signed)
Message left

## 2020-11-26 ENCOUNTER — Other Ambulatory Visit: Payer: Self-pay

## 2020-11-27 MED ORDER — HYDROXYZINE HCL 25 MG PO TABS: 25.0000 mg | ORAL_TABLET | Freq: Three times a day (TID) | ORAL | 0 refills | Status: DC | PRN

## 2020-11-27 NOTE — Telephone Encounter (Signed)
Please advise on refill.

## 2020-12-04 ENCOUNTER — Ambulatory Visit: Payer: BC Managed Care – PPO | Attending: Family

## 2020-12-04 DIAGNOSIS — Z23 Encounter for immunization: Secondary | ICD-10-CM

## 2020-12-04 NOTE — Progress Notes (Signed)
   Covid-19 Vaccination Clinic  Name:  LOLETA FROMMELT    MRN: [id number] DOB: 01-16-1976  12/04/2020  Ms. Kope was observed post Covid-19 immunization for 15 minutes without incident. She was provided with Vaccine Information Sheet and instruction to access the V-Safe system.   Ms. Rossner was instructed to call 911 with any severe reactions post vaccine: Difficulty breathing  Swelling of face and throat  A fast heartbeat  A bad rash all over body  Dizziness and weakness   Immunizations Administered     Name Date Dose VIS Date Route   Moderna Covid-19 vaccine Bivalent Booster 12/04/2020  4:00 PM 0.5 mL 09/22/2020 Intramuscular   Manufacturer: Moderna   Lot: [id number]   [place]: 53005-110-21

## 2020-12-05 ENCOUNTER — Other Ambulatory Visit (HOSPITAL_COMMUNITY): Payer: Self-pay

## 2020-12-06 ENCOUNTER — Other Ambulatory Visit (HOSPITAL_COMMUNITY): Payer: Self-pay

## 2020-12-13 ENCOUNTER — Telehealth: Payer: Self-pay

## 2020-12-13 ENCOUNTER — Other Ambulatory Visit (HOSPITAL_COMMUNITY): Payer: Self-pay

## 2020-12-13 NOTE — Telephone Encounter (Signed)
[facility] Patient Advocate Encounter   I was successful in securing patient a $7500.00 grant from [employer] (PAF) to provide copayment coverage for [place].  This will make the out of pocket cost $0.00.     I have spoken with the patient.    The billing information is as follows and has been shared with [facility].        Dates of Eligibility: 12/13/20 through 12/13/21  Patient knows to call the office with questions or concerns.  [name], [facility] Patient [facility] Phone: [phone] Fax:  [phone]

## 2020-12-17 ENCOUNTER — Other Ambulatory Visit: Payer: Self-pay

## 2020-12-17 ENCOUNTER — Encounter: Payer: Self-pay | Admitting: Family

## 2020-12-17 ENCOUNTER — Ambulatory Visit (INDEPENDENT_AMBULATORY_CARE_PROVIDER_SITE_OTHER): Payer: BC Managed Care – PPO | Admitting: Family

## 2020-12-17 VITALS — BP 129/85 | HR 114 | Resp 16 | Ht 66.0 in | Wt 128.0 lb

## 2020-12-17 DIAGNOSIS — Z0000 Encounter for general adult medical examination without abnormal findings: Secondary | ICD-10-CM

## 2020-12-17 DIAGNOSIS — B20 Human immunodeficiency virus [HIV] disease: Secondary | ICD-10-CM | POA: Diagnosis not present

## 2020-12-17 DIAGNOSIS — G4700 Insomnia, unspecified: Secondary | ICD-10-CM

## 2020-12-17 DIAGNOSIS — Z79899 Other long term (current) drug therapy: Secondary | ICD-10-CM | POA: Diagnosis not present

## 2020-12-17 DIAGNOSIS — F4322 Adjustment disorder with anxiety: Secondary | ICD-10-CM

## 2020-12-17 MED ORDER — HYDROXYZINE HCL 25 MG PO TABS
25.0000 mg | ORAL_TABLET | Freq: Three times a day (TID) | ORAL | 0 refills | Status: DC | PRN
Start: 2020-12-17 — End: 2021-02-22

## 2020-12-17 MED ORDER — CABOTEGRAVIR & RILPIVIRINE ER 600 & 900 MG/3ML IM SUER
1.0000 | Freq: Once | INTRAMUSCULAR | Status: AC
  Administered 2020-12-17: 1 via INTRAMUSCULAR

## 2020-12-17 MED ORDER — TRAZODONE HCL 50 MG PO TABS: ORAL_TABLET | ORAL | 3 refills | Status: DC

## 2020-12-17 NOTE — Assessment & Plan Note (Signed)
Jacqueline Dawson has adequately controlled stress and improved ability to sleep with trazodone and hydroxyzine.  She is hoping to come off medication following her wedding which is this coming Friday.  We will continue medication for now.

## 2020-12-17 NOTE — Assessment & Plan Note (Signed)
Ms. Argo has well-controlled virus with good adherence and tolerance to Cabenuva.  No signs/symptoms of opportunistic infection.  We reviewed previous lab work and discussed plan of care.  Check blood work today.  Every 96-month injection of Cabenuva provided with no complications.  Plan for follow-up in 2 months or sooner if needed with lab work on the same day.

## 2020-12-17 NOTE — Patient Instructions (Addendum)
Nice to see you.  We will check your lab work today.  Plan for follow up in 2 months or sooner if needed with lab work on the same day.  Have a great day and stay safe!  Congratulations on your upcoming wedding!

## 2020-12-17 NOTE — Assessment & Plan Note (Signed)
   Discussed importance of safe sexual practices and condom usage.  Condoms offered.  Declines vaccines.

## 2020-12-17 NOTE — Progress Notes (Signed)
Patient ID: Jacqueline Dawson, female    DOB: Aug 22, 1975, 45 y.o.   MRN: [id number]  Subjective:    Chief Complaint  Patient presents with   Follow-up    B20 Condoms offered patient declined    HIV Positive/AIDS    HPI:  Jacqueline Dawson is a 45 y.o. female with HIV disease last seen on 10/23/2020 with good adherence and tolerance to q 2 monthly Cabenuva. Lab work with viral load undetectable and CD4 count of 428. Here today for routine follow up.  Jacqueline Dawson continues to have good tolerance to [place]. Overall feeling well today with no new concerns/complaints. Denies fevers, chills, night sweats, headaches, changes in vision, neck pain/stiffness, nausea, diarrhea, vomiting, lesions or rashes.  Jacqueline Dawson is receiving her medication without problems. Denies feelings of being down, depressed or hopeless recently. Contnues to drink a glass of wine daily with occasional marijuana usage and about 2 cigarettes per day. Condoms offered.  Hydroxyzine and trazadone are helping with her stress and anxiety. Her wedding is on Friday.    Allergies  Allergen Reactions   Sulfonamide Derivatives Rash      Outpatient Medications Prior to Visit  Medication Sig Dispense Refill   cabotegravir & rilpivirine ER (CABENUVA) 600 & 900 MG/3ML injection Inject 1 kit monthly for 1 month and then every other month. 6 mL 6   ketoconazole (NIZORAL) 2 % shampoo ketoconazole 2 % shampoo     Multiple Vitamins-Minerals (MULTIVITAMINS THER. W/MINERALS) TABS Take 1 tablet by mouth daily.     naproxen (NAPROSYN) 500 MG tablet naproxen 500 mg tablet  Take 1 tablet every 12 hours by oral route as needed.     norethindrone (AYGESTIN) 5 MG tablet      Relugolix-Estradiol-Norethind (MYFEMBREE) 40-1-0.5 MG TABS      triamcinolone (KENALOG) 0.025 % cream SMARTSIG:2 Topical Twice Daily     valACYclovir (VALTREX) 500 MG tablet valacyclovir 500 mg tablet  TAKE 1 TABLET BY MOUTH EVERY DAY     hydrOXYzine  (ATARAX/VISTARIL) 25 MG tablet Take 1-2 tablets (25-50 mg total) by mouth 3 (three) times daily as needed. 30 tablet 0   traZODone (DESYREL) 50 MG tablet TAKE 1/2 TABLET (25 MG TOTAL) BY MOUTH AT BEDTIME AS NEEDED FOR SLEEP. 15 tablet 3   No facility-administered medications prior to visit.     Past Medical History:  Diagnosis Date   Anxiety    Depression    HIV positive ([place])      Past Surgical History:  Procedure Laterality Date   COLONOSCOPY  11/05/2020   LAPAROSCOPIC OVARIAN CYSTECTOMY Left 12/13/2015   Procedure: LAPAROSCOPIC OVARIAN CYSTECTOMY;  Surgeon: Everett Graff, MD;  Location: [facility];  Service: Gynecology;  Laterality: Left;   TUBAL LIGATION     WISDOM TOOTH EXTRACTION        Review of Systems  Constitutional:  Negative for appetite change, chills, diaphoresis, fatigue, fever and unexpected weight change.  Eyes:        Negative for acute change in vision  Respiratory:  Negative for chest tightness, shortness of breath and wheezing.   Cardiovascular:  Negative for chest pain.  Gastrointestinal:  Negative for diarrhea, nausea and vomiting.  Genitourinary:  Negative for dysuria, pelvic pain and vaginal discharge.  Musculoskeletal:  Negative for neck pain and neck stiffness.  Skin:  Negative for rash.  Neurological:  Negative for seizures, syncope, weakness and headaches.  Hematological:  Negative for adenopathy. Does not bruise/bleed easily.  Psychiatric/Behavioral:  Negative  for hallucinations.      Objective:    BP 129/85   Pulse (!) 114   Resp 16   Ht 5' 6"  (1.676 m)   Wt 128 lb (58.1 kg)   SpO2 100%   BMI 20.66 kg/m  Nursing note and vital signs reviewed.  Physical Exam Constitutional:      General: She is not in acute distress.    Appearance: She is well-developed.  Eyes:     Conjunctiva/sclera: Conjunctivae normal.  Cardiovascular:     Rate and Rhythm: Normal rate and regular rhythm.     Heart sounds: Normal heart sounds. No murmur heard.    No friction rub. No gallop.  Pulmonary:     Effort: Pulmonary effort is normal. No respiratory distress.     Breath sounds: Normal breath sounds. No wheezing or rales.  Chest:     Chest wall: No tenderness.  Abdominal:     General: Bowel sounds are normal.     Palpations: Abdomen is soft.     Tenderness: There is no abdominal tenderness.  Musculoskeletal:     Cervical back: Neck supple.  Lymphadenopathy:     Cervical: No cervical adenopathy.  Skin:    General: Skin is warm and dry.     Findings: No rash.  Neurological:     Mental Status: She is alert and oriented to person, place, and time.  Psychiatric:        Behavior: Behavior normal.        Thought Content: Thought content normal.        Judgment: Judgment normal.     Depression screen [facility] 2/9 10/23/2020 08/16/2020 01/31/2020 01/27/2018 05/06/2017  Decreased Interest 0 0 0 0 0  Down, Depressed, Hopeless 0 0 0 0 1  PHQ - 2 Score 0 0 0 0 1  Altered sleeping - - - - -  Tired, decreased energy - - - - -  Change in appetite - - - - -  Feeling bad or failure about yourself  - - - - -  Trouble concentrating - - - - -  Moving slowly or fidgety/restless - - - - -  Suicidal thoughts - - - - -  PHQ-9 Score - - - - -  Difficult doing work/chores - - - - -  Some recent data might be hidden       Assessment & Plan:    Patient Active Problem List   Diagnosis Date Noted   Fibroids 08/16/2020   HTN (hypertension) 01/27/2018   Adjustment disorder with anxious mood 01/15/2016   Insomnia 10/09/2010   ECTOPIC PREGNANCY 04/02/2007   Human immunodeficiency virus (HIV) disease ([place]) 11/11/2005   Depression 11/11/2005   ABFND PAP SMEAR HGSIL 11/11/2005     Problem List Items Addressed This Visit       Other   Human immunodeficiency virus (HIV) disease ([place]) - Primary   Relevant Medications   cabotegravir & rilpivirine ER (CABENUVA) 600 & 900 MG/3ML injection 1 kit (Start on 12/17/2020 10:00 AM)   Other Relevant Orders    COMPLETE METABOLIC PANEL WITH GFR   T-helper cell (CD4)- ([facility] clinic only)   HIV-1 RNA quant-no reflex-bld   Insomnia   Relevant Medications   traZODone (DESYREL) 50 MG tablet   Other Visit Diagnoses     Pharmacologic therapy       Relevant Orders   Lipid Profile        I am having Jacqueline Dawson maintain her multivitamins  ther. w/minerals, valACYclovir, cabotegravir & rilpivirine ER, norethindrone, naproxen, triamcinolone, ketoconazole, Myfembree, hydrOXYzine, and traZODone. We will continue to administer cabotegravir & rilpivirine ER.   Meds ordered this encounter  Medications   hydrOXYzine (ATARAX/VISTARIL) 25 MG tablet    Sig: Take 1-2 tablets (25-50 mg total) by mouth 3 (three) times daily as needed.    Dispense:  30 tablet    Refill:  0    Order Specific Question:   Supervising Provider    Answer:   Baxter Flattery, CYNTHIA [[id number]]   traZODone (DESYREL) 50 MG tablet    Sig: TAKE 1/2 TABLET (25 MG TOTAL) BY MOUTH AT BEDTIME AS NEEDED FOR SLEEP.    Dispense:  15 tablet    Refill:  3    Order Specific Question:   Supervising Provider    Answer:   Baxter Flattery, CYNTHIA [[id number]]   cabotegravir & rilpivirine ER (CABENUVA) 600 & 900 MG/3ML injection 1 kit     Follow-up: Return in about 2 months (around 02/16/2021), or if symptoms worsen or fail to improve.   Terri Piedra, MSN, FNP-C Nurse Practitioner [facility] for Infectious Disease [place] number: [phone]

## 2020-12-18 ENCOUNTER — Telehealth: Payer: Self-pay

## 2020-12-18 LAB — T-HELPER CELL (CD4) - (RCID CLINIC ONLY)
CD4 % Helper T Cell: 24 % — ABNORMAL LOW (ref 33–65)
CD4 T Cell Abs: 365 /uL — ABNORMAL LOW (ref 400–1790)

## 2020-12-18 NOTE — Telephone Encounter (Signed)
[facility] Patient Advocate Encounter  Patient's medication Kern Reap) have been [facility] and will be administered on patient next office visit on 12/17/20.  [name] , [facility] Patient [facility] for Infectious Disease Phone: [phone] Fax:  [phone]

## 2020-12-19 LAB — COMPLETE METABOLIC PANEL WITH GFR
AG Ratio: 1.6 (calc) (ref 1.0–2.5)
ALT: 12 U/L (ref 6–29)
AST: 15 U/L (ref 10–35)
Albumin: 4.6 g/dL (ref 3.6–5.1)
Alkaline phosphatase (APISO): 39 U/L (ref 31–125)
BUN: 14 mg/dL (ref 7–25)
CO2: 29 mmol/L (ref 20–32)
Calcium: 9.8 mg/dL (ref 8.6–10.2)
Chloride: 103 mmol/L (ref 98–110)
Creat: 0.89 mg/dL (ref 0.50–0.99)
Globulin: 2.8 g/dL (calc) (ref 1.9–3.7)
Glucose, Bld: 80 mg/dL (ref 65–99)
Potassium: 4 mmol/L (ref 3.5–5.3)
Sodium: 138 mmol/L (ref 135–146)
Total Bilirubin: 0.4 mg/dL (ref 0.2–1.2)
Total Protein: 7.4 g/dL (ref 6.1–8.1)
eGFR: 81 mL/min/{1.73_m2} (ref >=60)

## 2020-12-19 LAB — LIPID PANEL
Cholesterol: 176 mg/dL (ref <200)
HDL: 72 mg/dL (ref >=50)
LDL Cholesterol (Calc): 86 mg/dL (calc)
Non-HDL Cholesterol (Calc): 104 mg/dL (calc) (ref <130)
Total CHOL/HDL Ratio: 2.4 (calc) (ref <5.0)
Triglycerides: 87 mg/dL (ref <150)

## 2020-12-19 LAB — HIV-1 RNA QUANT-NO REFLEX-BLD
HIV 1 RNA Quant: 22 Copies/mL — ABNORMAL HIGH
HIV-1 RNA Quant, Log: 1.35 Log cps/mL — ABNORMAL HIGH

## 2021-02-19 ENCOUNTER — Telehealth: Payer: Self-pay

## 2021-02-19 NOTE — Telephone Encounter (Signed)
[facility] Patient Advocate Encounter  Patient's medication Kern Reap) have been [facility] and will be administered on patient next office visit on 02/22/21.  [name] , [facility] Patient [facility] for Infectious Disease Phone: [phone] Fax:  [phone]

## 2021-02-22 ENCOUNTER — Other Ambulatory Visit: Payer: Self-pay

## 2021-02-22 ENCOUNTER — Encounter: Payer: Self-pay | Admitting: Infectious Diseases

## 2021-02-22 ENCOUNTER — Ambulatory Visit (INDEPENDENT_AMBULATORY_CARE_PROVIDER_SITE_OTHER): Payer: BC Managed Care – PPO | Admitting: Infectious Diseases

## 2021-02-22 VITALS — BP 145/88 | HR 91 | Temp 98.5°F | Wt 133.8 lb

## 2021-02-22 DIAGNOSIS — G4700 Insomnia, unspecified: Secondary | ICD-10-CM

## 2021-02-22 DIAGNOSIS — R6882 Decreased libido: Secondary | ICD-10-CM | POA: Insufficient documentation

## 2021-02-22 DIAGNOSIS — B20 Human immunodeficiency virus [HIV] disease: Secondary | ICD-10-CM

## 2021-02-22 MED ORDER — CABOTEGRAVIR & RILPIVIRINE ER 600 & 900 MG/3ML IM SUER
1.0000 | Freq: Once | INTRAMUSCULAR | Status: AC
  Administered 2021-02-22: 1 via INTRAMUSCULAR

## 2021-02-22 MED ORDER — HYDROXYZINE HCL 25 MG PO TABS: 25.0000 mg | ORAL_TABLET | Freq: Three times a day (TID) | ORAL | 3 refills | Status: DC | PRN

## 2021-02-22 MED ORDER — TRAZODONE HCL 50 MG PO TABS: ORAL_TABLET | ORAL | 3 refills | Status: DC

## 2021-02-22 NOTE — Assessment & Plan Note (Signed)
Possibly related to perimenopausal hormone shifts, Myfemree (3-4% occurrence with this med) vs situation anxiety/stressors. Will plan to speak with her GYN team about this at upcoming visit.

## 2021-02-22 NOTE — Assessment & Plan Note (Signed)
Refills provided today for hydroxyzine and trazodone at current doses

## 2021-02-22 NOTE — Progress Notes (Signed)
Subjective:    Patient ID: Jacqueline Dawson, female    DOB: 11/18/1975, 46 y.o.   MRN: [id number]  CC:  Cabenuva injection  Stress/insomnia  Low libido    HPI: Jacqueline Dawson is a 46 y.o. female with well controlled HIV disease here for q73minjection of her cabenuva treatment.   Last injection given 12/17/2020 - has been taking for about a year now and loves it. Works very well for her and she has very minimal s/e. Only slight injection site soreness.   She says she has noticed a pretty abrupt change in her libido. Married to husband. She lost her father about a year ago after her birthday. Wonders if this may have something to do with it. She does not get regular menses with having had a ablation and now on myfembree per GYN for post-ablation bleeding (new since October 2022).   Would like refills for hydroxyzine and trazodone - helps with sleep. Does not need the anxiety medication during the day.     Allergies  Allergen Reactions   Sulfonamide Derivatives Rash      Outpatient Medications Prior to Visit  Medication Sig Dispense Refill   cabotegravir & rilpivirine ER (CABENUVA) 600 & 900 MG/3ML injection Inject 1 kit monthly for 1 month and then every other month. 6 mL 6   ketoconazole (NIZORAL) 2 % shampoo ketoconazole 2 % shampoo     Multiple Vitamins-Minerals (MULTIVITAMINS THER. W/MINERALS) TABS Take 1 tablet by mouth daily.     naproxen (NAPROSYN) 500 MG tablet naproxen 500 mg tablet  Take 1 tablet every 12 hours by oral route as needed.     norethindrone (AYGESTIN) 5 MG tablet      Relugolix-Estradiol-Norethind (MYFEMBREE) 40-1-0.5 MG TABS      triamcinolone (KENALOG) 0.025 % cream SMARTSIG:2 Topical Twice Daily     valACYclovir (VALTREX) 500 MG tablet valacyclovir 500 mg tablet  TAKE 1 TABLET BY MOUTH EVERY DAY     hydrOXYzine (ATARAX/VISTARIL) 25 MG tablet Take 1-2 tablets (25-50 mg total) by mouth 3 (three) times daily as needed. 30 tablet 0   traZODone (DESYREL)  50 MG tablet TAKE 1/2 TABLET (25 MG TOTAL) BY MOUTH AT BEDTIME AS NEEDED FOR SLEEP. 15 tablet 3   No facility-administered medications prior to visit.     Past Medical History:  Diagnosis Date   Anxiety    Depression    HIV positive ([place]       Past Surgical History:  Procedure Laterality Date   COLONOSCOPY  11/05/2020   LAPAROSCOPIC OVARIAN CYSTECTOMY Left 12/13/2015   Procedure: LAPAROSCOPIC OVARIAN CYSTECTOMY;  Surgeon: AEverett Graff MD;  [facility];  Service: Gynecology;  Laterality: Left;   TUBAL LIGATION     WISDOM TOOTH EXTRACTION        Review of Systems  Constitutional:  Negative for appetite change, chills, fatigue, fever and unexpected weight change.  HENT:  Negative for mouth sores, sore throat and trouble swallowing.   Eyes:  Negative for pain and visual disturbance.  Respiratory:  Negative for cough and shortness of breath.   Cardiovascular:  Negative for chest pain.  Gastrointestinal:  Negative for abdominal pain, diarrhea and nausea.  Genitourinary:  Negative for dysuria, menstrual problem, pelvic pain and vaginal bleeding.       Low libido  Musculoskeletal:  Negative for back pain and neck pain.  Skin:  Negative for color change and rash.  Neurological:  Negative for weakness, numbness and headaches.  Hematological:  Negative for adenopathy.  Psychiatric/Behavioral:  Positive for sleep disturbance. Negative for dysphoric mood. The patient is not nervous/anxious.        Objective:    BP (!) 145/88    Pulse 91    Temp 98.5 F (36.9 C) (Oral)    Wt 133 lb 12.8 oz (60.7 kg)    SpO2 100%    BMI 21.60 kg/m  Nursing note and vital signs reviewed.  Physical Exam Constitutional:      Appearance: Normal appearance. She is not ill-appearing.  HENT:     Mouth/Throat:     Mouth: Mucous membranes are moist.     Pharynx: Oropharynx is clear.  Eyes:     General: No scleral icterus. Cardiovascular:     Rate and Rhythm: Normal rate.  Pulmonary:      Effort: Pulmonary effort is normal.  Neurological:     Mental Status: She is oriented to person, place, and time.  Psychiatric:        Mood and Affect: Mood normal.        Thought Content: Thought content normal.        Assessment & Plan:   Patient Active Problem List   Diagnosis Date Noted   Low libido 02/22/2021   Healthcare maintenance 12/17/2020   Fibroids 08/16/2020   HTN (hypertension) 01/27/2018   Adjustment disorder with anxious mood 01/15/2016   Insomnia 10/09/2010   ECTOPIC PREGNANCY 04/02/2007   Human immunodeficiency virus (HIV) disease ([place]) 11/11/2005   Depression 11/11/2005   ABFND PAP SMEAR HGSIL 11/11/2005    Problem List Items Addressed This Visit       Unprioritized   Human immunodeficiency virus (HIV) disease ([place])    Well controlled on q57mCabenuva injections now coming up on 1 year of treatment. I think we can space out viral load checks to every other injection now.  She tolerated treatment well today.   Next injection visit: 04/23/2021 with pharmacy team        Insomnia    Refills provided today for hydroxyzine and trazodone at current doses      Relevant Medications   traZODone (DESYREL) 50 MG tablet   Low libido    Possibly related to perimenopausal hormone shifts, Myfemree (3-4% occurrence with this med) vs situation anxiety/stressors. Will plan to speak with her GYN team about this at upcoming visit.       Other Visit Diagnoses     HIV disease ([place]    -  Primary   Relevant Medications   cabotegravir & rilpivirine ER (CABENUVA) 600 & 900 MG/3ML injection 1 kit (Completed)        I have changed Jacqueline Dawson's hydrOXYzine. I am also having her maintain her multivitamins ther. w/minerals, valACYclovir, cabotegravir & rilpivirine ER, norethindrone, naproxen, triamcinolone, ketoconazole, Myfembree, and traZODone. We administered cabotegravir & rilpivirine ER.   Meds ordered this encounter  Medications   traZODone (DESYREL) 50  MG tablet    Sig: TAKE 1/2 TABLET (25 MG TOTAL) BY MOUTH AT BEDTIME AS NEEDED FOR SLEEP.    Dispense:  30 tablet    Refill:  3   hydrOXYzine (ATARAX) 25 MG tablet    Sig: Take 1-2 tablets (25-50 mg total) by mouth 3 (three) times daily as needed.    Dispense:  60 tablet    Refill:  3   cabotegravir & rilpivirine ER (CABENUVA) 600 & 900 MG/3ML injection 1 kit     Follow-up: RTC in 60 days for maintenance injection  with our pharmacy team. FU with ID provider in 71m   Kristopher Delk, MSN, NP-C [facility] CLake [email or web address] Pager: [phone]: [phone] [phone]

## 2021-02-22 NOTE — Assessment & Plan Note (Signed)
Well controlled on q57m Cabenuva injections now coming up on 1 year of treatment. I think we can space out viral load checks to every other injection now.  She tolerated treatment well today.   Next injection visit: 04/23/2021 with pharmacy team

## 2021-03-04 ENCOUNTER — Encounter: Payer: Self-pay | Admitting: Pharmacist

## 2021-03-18 ENCOUNTER — Other Ambulatory Visit: Payer: Self-pay | Admitting: Infectious Diseases

## 2021-03-18 NOTE — Telephone Encounter (Signed)
Ok to refill 

## 2021-04-11 ENCOUNTER — Telehealth: Payer: Self-pay

## 2021-04-11 ENCOUNTER — Other Ambulatory Visit (HOSPITAL_COMMUNITY): Payer: Self-pay

## 2021-04-11 NOTE — Telephone Encounter (Signed)
[facility] Patient Advocate Encounter   Received notification from [facility] that prior authorization for Jacqueline Dawson is required.   PA submitted on 04/11/21 Jacqueline Dawson Status is pending    [facility] will continue to follow.   [name], [facility] Patient [facility] for Infectious Disease Phone: [phone] Fax:  [phone]

## 2021-04-22 ENCOUNTER — Other Ambulatory Visit: Payer: Self-pay | Admitting: Pharmacist

## 2021-04-22 ENCOUNTER — Other Ambulatory Visit (HOSPITAL_COMMUNITY): Payer: Self-pay

## 2021-04-22 DIAGNOSIS — B20 Human immunodeficiency virus [HIV] disease: Secondary | ICD-10-CM

## 2021-04-22 MED ORDER — CABOTEGRAVIR & RILPIVIRINE ER 600 & 900 MG/3ML IM SUER: 1.0000 | INTRAMUSCULAR | 5 refills | Status: DC

## 2021-04-23 ENCOUNTER — Ambulatory Visit: Payer: BC Managed Care – PPO | Admitting: Pharmacist

## 2021-04-23 ENCOUNTER — Other Ambulatory Visit: Payer: Self-pay

## 2021-04-23 ENCOUNTER — Other Ambulatory Visit: Payer: Self-pay | Admitting: Pharmacist

## 2021-04-23 ENCOUNTER — Ambulatory Visit (INDEPENDENT_AMBULATORY_CARE_PROVIDER_SITE_OTHER): Payer: BC Managed Care – PPO | Admitting: Pharmacist

## 2021-04-23 DIAGNOSIS — B20 Human immunodeficiency virus [HIV] disease: Secondary | ICD-10-CM

## 2021-04-23 MED ORDER — CABOTEGRAVIR & RILPIVIRINE ER 600 & 900 MG/3ML IM SUER
1.0000 | Freq: Once | INTRAMUSCULAR | Status: AC
  Administered 2021-04-23: 1 via INTRAMUSCULAR

## 2021-04-23 MED ORDER — CABOTEGRAVIR & RILPIVIRINE ER 600 & 900 MG/3ML IM SUER: 1.0000 | INTRAMUSCULAR | 5 refills | Status: DC

## 2021-04-23 NOTE — Progress Notes (Signed)
? ?HPI: Jacqueline Dawson is a 46 y.o. female who presents to the [place] clinic for [place] administration. ? ?Patient Active Problem List  ? Diagnosis Date Noted  ? Low libido 02/22/2021  ? Healthcare maintenance 12/17/2020  ? Fibroids 08/16/2020  ? HTN (hypertension) 01/27/2018  ? Adjustment disorder with anxious mood 01/15/2016  ? Insomnia 10/09/2010  ? ECTOPIC PREGNANCY 04/02/2007  ? Human immunodeficiency virus (HIV) disease ([place]) 11/11/2005  ? Depression 11/11/2005  ? ABFND PAP SMEAR HGSIL 11/11/2005  ? ? ?Patient's Medications  ?New Prescriptions  ? No medications on file  ?Previous Medications  ? CABOTEGRAVIR & RILPIVIRINE ER (CABENUVA) 600 & 900 MG/3ML INJECTION    Inject 1 kit into the muscle every 2 (two) months. Inject 1 kit monthly for 1 month and then every other month. ICD 10 B20  ? HYDROXYZINE (ATARAX) 25 MG TABLET    Take 1-2 tablets (25-50 mg total) by mouth 3 (three) times daily as needed.  ? KETOCONAZOLE (NIZORAL) 2 % SHAMPOO    ketoconazole 2 % shampoo  ? MULTIPLE VITAMINS-MINERALS (MULTIVITAMINS THER. W/MINERALS) TABS    Take 1 tablet by mouth daily.  ? NAPROXEN (NAPROSYN) 500 MG TABLET    naproxen 500 mg tablet ? Take 1 tablet every 12 hours by oral route as needed.  ? NORETHINDRONE (AYGESTIN) 5 MG TABLET      ? RELUGOLIX-ESTRADIOL-NORETHIND (MYFEMBREE) 40-1-0.5 MG TABS      ? TRAZODONE (DESYREL) 50 MG TABLET    TAKE 1/2 TABLET (25 MG TOTAL) BY MOUTH AT BEDTIME AS NEEDED FOR SLEEP.  ? TRIAMCINOLONE (KENALOG) 0.025 % CREAM    SMARTSIG:2 Topical Twice Daily  ? VALACYCLOVIR (VALTREX) 500 MG TABLET    valacyclovir 500 mg tablet ? TAKE 1 TABLET BY MOUTH EVERY DAY  ?Modified Medications  ? No medications on file  ?Discontinued Medications  ? No medications on file  ? ? ?Allergies: ?Allergies  ?Allergen Reactions  ? Sulfonamide Derivatives Rash  ? ? ?Past Medical History: ?Past Medical History:  ?Diagnosis Date  ? Anxiety   ? Depression   ? HIV positive ([place])   ? ? ?Social History: ?Social  History  ? ?Socioeconomic History  ? Marital status: Divorced  ?  Spouse name: Not on file  ? Number of children: Not on file  ? Years of education: Not on file  ? Highest education level: Not on file  ?Occupational History  ? Not on file  ?Tobacco Use  ? Smoking status: Every Day  ?  Packs/day: 0.10  ?  Years: 16.00  ?  Pack years: 1.60  ?  Types: Cigarettes  ?  Last attempt to quit: 01/28/2011  ?  Years since quitting: 10.2  ? Smokeless tobacco: Never  ? Tobacco comments:  ?  Reports she is down to 2 cigarettes a day   ?Vaping Use  ? Vaping Use: Never used  ?Substance and Sexual Activity  ? Alcohol use: Yes  ?  Alcohol/week: 7.0 standard drinks  ?  Types: 7 Glasses of wine per week  ?  Comment: glass of wine a day per pt  ? Drug use: Yes  ?  Types: Marijuana  ?  Comment: occ--11/05/20- states has been over a week  ? Sexual activity: Not Currently  ?  Partners: Male  ?  Birth control/protection: Surgical  ?Other Topics Concern  ? Not on file  ?Social History Narrative  ? Not on file  ? ?Social Determinants of Health  ? ?Emergency planning/management officer  Strain: Not on file  ?Food Insecurity: Not on file  ?Transportation Needs: Not on file  ?Physical Activity: Not on file  ?Stress: Not on file  ?Social Connections: Not on file  ? ? ?Labs: ?Lab Results  ?Component Value Date  ? HIV1RNAQUANT 22 (H) 12/17/2020  ? HIV1RNAQUANT <20 (H) 10/23/2020  ? HIV1RNAQUANT Not Detected 08/16/2020  ? CD4TABS 365 (L) 12/17/2020  ? CD4TABS 428 10/23/2020  ? CD4TABS 464 06/19/2020  ? ? ?RPR and STI ?Lab Results  ?Component Value Date  ? LABRPR NON-REACTIVE 06/19/2020  ? LABRPR NON-REACTIVE 01/16/2020  ? LABRPR NON-REACTIVE 05/27/2019  ? LABRPR NON-REACTIVE 07/29/2018  ? LABRPR NON-REACTIVE 01/13/2018  ? ? ?STI Results GC CT  ?06/19/2020 Negative Negative  ?01/16/2020 Negative Negative  ?07/29/2018 Negative Negative  ?01/13/2018 Negative Negative  ?07/11/2015 Negative Negative  ?01/08/2015 Negative Negative  ?12/29/2014 Negative Negative  ? ? ?Hepatitis  B ?Lab Results  ?Component Value Date  ? HEPBSAB NEG 05/11/2013  ? HEPBSAG NO 04/06/2006  ? ?Hepatitis C ?No results found for: [place], HCVRNAPCRQN ?Hepatitis A ?No results found for: HAV ?Lipids: ?Lab Results  ?Component Value Date  ? CHOL 176 12/17/2020  ? TRIG 87 12/17/2020  ? HDL 72 12/17/2020  ? CHOLHDL 2.4 12/17/2020  ? VLDL 12 07/16/2016  ? [place] 86 12/17/2020  ? ? ?TARGET DATE:  The 11th of the month ? ?Current HIV Regimen: ?Cabenuva q66mo? ?Assessment: ?[name] today for their maintenance Cabenuva injections. Initial/past injections were tolerated well without issues. No problems with systemic effects of injections. Patient did experience some itching on the right side of her gluteus. She endorses this is the first time this has occurred after administration. Upon examination, no skin manifestations noted. Will continue to monitor. Will check HIV RNA today.   ? ?Administered cabotegravir 6036m48mL in left upper outer quadrant of the gluteal muscle. Administered rilpivirine 900 mg/48m748mn the right upper outer quadrant of the gluteal muscle. Monitored patient for 10 minutes after injection. Injections were tolerated well without issue. Patient will follow up in 2 months for next injection. ? ?Patient endorses being monogamous with her husband and deferred cytologies today.  ? ?Plan: ?- Cabenuva injections administered ?- HIV RNA today ?- Next injections scheduled for 06/20/21 ?- Call with any issues or questions ? ?RacJoseph Artharm.D. ?PGY-1 Pharmacy Resident ?04/23/2021 9:47 AM ?

## 2021-04-26 LAB — HIV-1 RNA QUANT-NO REFLEX-BLD
HIV 1 RNA Quant: <20 Copies/mL — ABNORMAL HIGH
HIV-1 RNA Quant, Log: <1.3 Log cps/mL — ABNORMAL HIGH

## 2021-06-12 ENCOUNTER — Telehealth: Payer: Self-pay

## 2021-06-12 NOTE — Telephone Encounter (Signed)
[facility] Patient Advocate Encounter ? ?Patient's medication Kern Reap) have been couriered to [facility] from [place] and will be administered on the patient next office visit on 06/20/21. ? ?[name] , CPhT ?Specialty Pharmacy Patient Advocate ?[place] for Infectious Disease ?Phone: [phone] ?Fax:  [phone]  ?

## 2021-06-20 ENCOUNTER — Ambulatory Visit (INDEPENDENT_AMBULATORY_CARE_PROVIDER_SITE_OTHER): Payer: BC Managed Care – PPO | Admitting: Pharmacist

## 2021-06-20 ENCOUNTER — Other Ambulatory Visit: Payer: Self-pay

## 2021-06-20 DIAGNOSIS — Z23 Encounter for immunization: Secondary | ICD-10-CM

## 2021-06-20 DIAGNOSIS — B20 Human immunodeficiency virus [HIV] disease: Secondary | ICD-10-CM | POA: Diagnosis not present

## 2021-06-20 MED ORDER — CABOTEGRAVIR & RILPIVIRINE ER 600 & 900 MG/3ML IM SUER: 1.0000 | Freq: Once | INTRAMUSCULAR | Status: DC

## 2021-06-20 MED ORDER — CABOTEGRAVIR & RILPIVIRINE ER 600 & 900 MG/3ML IM SUER
1.0000 | Freq: Once | INTRAMUSCULAR | Status: AC
  Administered 2021-06-20: 1 via INTRAMUSCULAR

## 2021-06-20 NOTE — Progress Notes (Signed)
? ?HPI: Jacqueline Dawson is a 46 y.o. female who presents to the [place] clinic for [place] administration. ? ?Patient Active Problem List  ? Diagnosis Date Noted  ? Low libido 02/22/2021  ? Healthcare maintenance 12/17/2020  ? Fibroids 08/16/2020  ? HTN (hypertension) 01/27/2018  ? Adjustment disorder with anxious mood 01/15/2016  ? Insomnia 10/09/2010  ? ECTOPIC PREGNANCY 04/02/2007  ? Human immunodeficiency virus (HIV) disease ([place]) 11/11/2005  ? Depression 11/11/2005  ? ABFND PAP SMEAR HGSIL 11/11/2005  ? ? ?Patient's Medications  ?New Prescriptions  ? No medications on file  ?Previous Medications  ? CABOTEGRAVIR & RILPIVIRINE ER (CABENUVA) 600 & 900 MG/3ML INJECTION    Inject 1 kit into the muscle every 2 (two) months.  ? HYDROXYZINE (ATARAX) 25 MG TABLET    Take 1-2 tablets (25-50 mg total) by mouth 3 (three) times daily as needed.  ? KETOCONAZOLE (NIZORAL) 2 % SHAMPOO    ketoconazole 2 % shampoo  ? MULTIPLE VITAMINS-MINERALS (MULTIVITAMINS THER. W/MINERALS) TABS    Take 1 tablet by mouth daily.  ? NAPROXEN (NAPROSYN) 500 MG TABLET    naproxen 500 mg tablet ? Take 1 tablet every 12 hours by oral route as needed.  ? NORETHINDRONE (AYGESTIN) 5 MG TABLET      ? RELUGOLIX-ESTRADIOL-NORETHIND (MYFEMBREE) 40-1-0.5 MG TABS      ? TRAZODONE (DESYREL) 50 MG TABLET    TAKE 1/2 TABLET (25 MG TOTAL) BY MOUTH AT BEDTIME AS NEEDED FOR SLEEP.  ? TRIAMCINOLONE (KENALOG) 0.025 % CREAM    SMARTSIG:2 Topical Twice Daily  ? VALACYCLOVIR (VALTREX) 500 MG TABLET    valacyclovir 500 mg tablet ? TAKE 1 TABLET BY MOUTH EVERY DAY  ?Modified Medications  ? No medications on file  ?Discontinued Medications  ? No medications on file  ? ? ?Allergies: ?Allergies  ?Allergen Reactions  ? Sulfonamide Derivatives Rash  ? ? ?Past Medical History: ?Past Medical History:  ?Diagnosis Date  ? Anxiety   ? Depression   ? HIV positive ([place])   ? ? ?Social History: ?Social History  ? ?Socioeconomic History  ? Marital status: Divorced  ?  Spouse  name: Not on file  ? Number of children: Not on file  ? Years of education: Not on file  ? Highest education level: Not on file  ?Occupational History  ? Not on file  ?Tobacco Use  ? Smoking status: Every Day  ?  Packs/day: 0.10  ?  Years: 16.00  ?  Pack years: 1.60  ?  Types: Cigarettes  ?  Last attempt to quit: 01/28/2011  ?  Years since quitting: 10.4  ? Smokeless tobacco: Never  ? Tobacco comments:  ?  Reports she is down to 2 cigarettes a day   ?Vaping Use  ? Vaping Use: Never used  ?Substance and Sexual Activity  ? Alcohol use: Yes  ?  Alcohol/week: 7.0 standard drinks  ?  Types: 7 Glasses of wine per week  ?  Comment: glass of wine a day per pt  ? Drug use: Yes  ?  Types: Marijuana  ?  Comment: occ--11/05/20- states has been over a week  ? Sexual activity: Not Currently  ?  Partners: Male  ?  Birth control/protection: Surgical  ?Other Topics Concern  ? Not on file  ?Social History Narrative  ? Not on file  ? ?Social Determinants of Health  ? ?Financial Resource Strain: Not on file  ?Food Insecurity: Not on file  ?Transportation Needs: Not on  file  ?Physical Activity: Not on file  ?Stress: Not on file  ?Social Connections: Not on file  ? ? ?Labs: ?Lab Results  ?Component Value Date  ? HIV1RNAQUANT <20 (H) 04/23/2021  ? HIV1RNAQUANT 22 (H) 12/17/2020  ? HIV1RNAQUANT <20 (H) 10/23/2020  ? CD4TABS 365 (L) 12/17/2020  ? CD4TABS 428 10/23/2020  ? CD4TABS 464 06/19/2020  ? ? ?RPR and STI ?Lab Results  ?Component Value Date  ? LABRPR NON-REACTIVE 06/19/2020  ? LABRPR NON-REACTIVE 01/16/2020  ? LABRPR NON-REACTIVE 05/27/2019  ? LABRPR NON-REACTIVE 07/29/2018  ? LABRPR NON-REACTIVE 01/13/2018  ? ? ?STI Results GC CT  ?06/19/2020 ? 4:47 PM Negative   Negative    ?01/16/2020 ? 4:15 PM Negative   Negative    ?07/29/2018 ?12:00 AM Negative   Negative    ?01/13/2018 ?12:00 AM Negative   Negative    ?07/11/2015 ?12:00 AM Negative   Negative    ?01/08/2015 ?12:00 AM Negative   Negative    ?12/29/2014 ?12:00 AM Negative   Negative     ? ? ?Hepatitis B ?Lab Results  ?Component Value Date  ? HEPBSAB NEG 05/11/2013  ? HEPBSAG NO 04/06/2006  ? ?Hepatitis C ?No results found for: [place], HCVRNAPCRQN ?Hepatitis A ?No results found for: HAV ?Lipids: ?Lab Results  ?Component Value Date  ? CHOL 176 12/17/2020  ? TRIG 87 12/17/2020  ? HDL 72 12/17/2020  ? CHOLHDL 2.4 12/17/2020  ? VLDL 12 07/16/2016  ? [place] 86 12/17/2020  ? ? ?TARGET DATE: ?11th of the month ? ?Assessment: ?Harlow presents today for their maintenance Cabenuva injections. Past injections were tolerated well without issues. No problems with systemic side effects of injections. Patient did experience some site pain that was self limiting. Hepatitis A vaccine and Menveo booster were administered today as well. No signs or symptoms of STI and monogamous relationship with husband so STI testing was politely declined. HIV RNA and Hepatitis B SAb ordered to verify immunity. ? ?Administered cabotegravir 614m/3mL in left upper outer quadrant of the gluteal muscle. Administered rilpivirine 900 mg/346min the right upper outer quadrant of the gluteal muscle. Injections were tolerated well. Patient will follow up in 2 months for next set of injections.  ? ?Plan: ?- Cabenuva injections administered ?- Next injections scheduled for 7/12 with Dr. CaElna Breslownd 9/12 with Dr. KuEber Hong- Call with any issues or questions ?- Administered 1/2 Hep A vaccine in series and Menveo booster ?- F/u HepB Sab to assess for need of Hep B vaccine booster ? ?MaVarney DailyPharmD ?PGY1 Pharmacy Resident ?[facility] ? ?

## 2021-06-23 LAB — HIV-1 RNA QUANT-NO REFLEX-BLD
HIV 1 RNA Quant: NOT DETECTED copies/mL
HIV-1 RNA Quant, Log: NOT DETECTED Log copies/mL

## 2021-06-23 LAB — HEPATITIS B SURFACE ANTIBODY,QUALITATIVE: Hep B S Ab: REACTIVE — AB

## 2021-06-24 ENCOUNTER — Ambulatory Visit: Payer: BC Managed Care – PPO | Admitting: Pharmacist

## 2021-06-25 ENCOUNTER — Ambulatory Visit: Payer: BC Managed Care – PPO | Admitting: Pharmacist

## 2021-07-04 ENCOUNTER — Encounter: Payer: Self-pay | Admitting: Infectious Diseases

## 2021-08-20 ENCOUNTER — Telehealth: Payer: Self-pay

## 2021-08-20 NOTE — Telephone Encounter (Signed)
[facility] Patient Advocate Encounter  Patient's medication Kern Reap) have been couriered to [facility] from [place] and will be administered on the patient next office visit on 08/22/21.  [name] , [facility] Patient [facility] for Infectious Disease Phone: [phone] Fax:  [phone]

## 2021-08-22 ENCOUNTER — Other Ambulatory Visit: Payer: Self-pay

## 2021-08-22 ENCOUNTER — Ambulatory Visit (INDEPENDENT_AMBULATORY_CARE_PROVIDER_SITE_OTHER): Payer: BC Managed Care – PPO | Admitting: Family

## 2021-08-22 ENCOUNTER — Encounter: Payer: Self-pay | Admitting: Family

## 2021-08-22 VITALS — BP 148/97 | HR 85 | Temp 98.7°F | Wt 137.0 lb

## 2021-08-22 DIAGNOSIS — B20 Human immunodeficiency virus [HIV] disease: Secondary | ICD-10-CM

## 2021-08-22 DIAGNOSIS — Z0000 Encounter for general adult medical examination without abnormal findings: Secondary | ICD-10-CM

## 2021-08-22 MED ORDER — CABOTEGRAVIR & RILPIVIRINE ER 600 & 900 MG/3ML IM SUER
1.0000 | Freq: Once | INTRAMUSCULAR | Status: AC
  Administered 2021-08-22: 1 via INTRAMUSCULAR

## 2021-08-22 NOTE — Progress Notes (Signed)
Patient ID: Jacqueline Dawson, female    DOB: [birth year], 46 y.o.   MRN: [id number]  Subjective:    Chief Complaint  Patient presents with   HIV Positive/AIDS    HPI:  Jacqueline Dawson is a 46 y.o. female with HIV disease last seen on 06/20/2021 with well-controlled virus and good adherence and tolerance to every 2 month dosing of  Cabenuva.  Viral load was undetectable.  Here today for routine follow-up and next injection.   Jacqueline Dawson continues to have good tolerance with no adverse side effects.  Soreness lasts approximately 1 day and is well managed with movement and over-the-counter medications as needed.  Feeling well today with no new concerns/complaints. Denies fevers, chills, night sweats, headaches, changes in vision, neck pain/stiffness, nausea, diarrhea, vomiting, lesions or rashes.  Jacqueline Dawson denies feeling of being down, depressed or hopeless recently.  Drinks alcohol on occasion with no current recreational/illicit drug use and daily tobacco use. Condoms offered. Routine vaccinations up to date. Due for routine dental care. Cervical cancer, colon cancer and breast cancer screenings are up to date.    Allergies  Allergen Reactions   Sulfonamide Derivatives Rash      Outpatient Medications Prior to Visit  Medication Sig Dispense Refill   cabotegravir & rilpivirine ER (CABENUVA) 600 & 900 MG/3ML injection Inject 1 kit into the muscle every 2 (two) months. 6 mL 5   hydrOXYzine (ATARAX) 25 MG tablet Take 1-2 tablets (25-50 mg total) by mouth 3 (three) times daily as needed. 60 tablet 3   ketoconazole (NIZORAL) 2 % shampoo ketoconazole 2 % shampoo     Multiple Vitamins-Minerals (MULTIVITAMINS THER. W/MINERALS) TABS Take 1 tablet by mouth daily.     naproxen (NAPROSYN) 500 MG tablet naproxen 500 mg tablet  Take 1 tablet every 12 hours by oral route as needed.     norethindrone (AYGESTIN) 5 MG tablet      Relugolix-Estradiol-Norethind (MYFEMBREE) 40-1-0.5 MG TABS       traZODone (DESYREL) 50 MG tablet TAKE 1/2 TABLET (25 MG TOTAL) BY MOUTH AT BEDTIME AS NEEDED FOR SLEEP. 30 tablet 3   triamcinolone (KENALOG) 0.025 % cream SMARTSIG:2 Topical Twice Daily     valACYclovir (VALTREX) 500 MG tablet valacyclovir 500 mg tablet  TAKE 1 TABLET BY MOUTH EVERY DAY     No facility-administered medications prior to visit.     Past Medical History:  Diagnosis Date   Anxiety    Depression    HIV positive ([place])      Past Surgical History:  Procedure Laterality Date   COLONOSCOPY  11/05/2020   LAPAROSCOPIC OVARIAN CYSTECTOMY Left 12/13/2015   Procedure: LAPAROSCOPIC OVARIAN CYSTECTOMY;  Surgeon: Everett Graff, MD;  Location: [facility];  Service: Gynecology;  Laterality: Left;   TUBAL LIGATION     WISDOM TOOTH EXTRACTION        Review of Systems  Constitutional:  Negative for appetite change, chills, diaphoresis, fatigue, fever and unexpected weight change.  Eyes:        Negative for acute change in vision  Respiratory:  Negative for chest tightness, shortness of breath and wheezing.   Cardiovascular:  Negative for chest pain.  Gastrointestinal:  Negative for diarrhea, nausea and vomiting.  Genitourinary:  Negative for dysuria, pelvic pain and vaginal discharge.  Musculoskeletal:  Negative for neck pain and neck stiffness.  Skin:  Negative for rash.  Neurological:  Negative for seizures, syncope, weakness and headaches.  Hematological:  Negative for adenopathy. Does not  bruise/bleed easily.  Psychiatric/Behavioral:  Negative for hallucinations.       Objective:    BP (!) 148/97   Pulse 85   Temp 98.7 F (37.1 C) (Oral)   Wt 137 lb (62.1 kg)   SpO2 100%   BMI 22.11 kg/m  Nursing note and vital signs reviewed.  Physical Exam Constitutional:      General: She is not in acute distress.    Appearance: She is well-developed.  Eyes:     Conjunctiva/sclera: Conjunctivae normal.  Cardiovascular:     Rate and Rhythm: Normal rate and regular rhythm.      Heart sounds: Normal heart sounds. No murmur heard.    No friction rub. No gallop.  Pulmonary:     Effort: Pulmonary effort is normal. No respiratory distress.     Breath sounds: Normal breath sounds. No wheezing or rales.  Chest:     Chest wall: No tenderness.  Abdominal:     General: Bowel sounds are normal.     Palpations: Abdomen is soft.     Tenderness: There is no abdominal tenderness.  Musculoskeletal:     Cervical back: Neck supple.  Lymphadenopathy:     Cervical: No cervical adenopathy.  Skin:    General: Skin is warm and dry.     Findings: No rash.  Neurological:     Mental Status: She is alert and oriented to person, place, and time.  Psychiatric:        Behavior: Behavior normal.        Thought Content: Thought content normal.        Judgment: Judgment normal.         02/22/2021    9:23 AM 10/23/2020    2:19 PM 08/16/2020    4:34 PM 01/31/2020    3:44 PM 01/27/2018    9:45 AM  Depression screen PHQ 2/9  Decreased Interest 0 0 0 0 0  Down, Depressed, Hopeless 1 0 0 0 0  PHQ - 2 Score 1 0 0 0 0       Assessment & Plan:    Patient Active Problem List   Diagnosis Date Noted   Low libido 02/22/2021   Healthcare maintenance 12/17/2020   Fibroids 08/16/2020   HTN (hypertension) 01/27/2018   Adjustment disorder with anxious mood 01/15/2016   Insomnia 10/09/2010   ECTOPIC PREGNANCY 04/02/2007   Human immunodeficiency virus (HIV) disease ([place]) 11/11/2005   Depression 11/11/2005   ABFND PAP SMEAR HGSIL 11/11/2005     Problem List Items Addressed This Visit       Other   Human immunodeficiency virus (HIV) disease ([place]) - Primary    Jacqueline Dawson continues to have well-controlled virus with good adherence and tolerance to [place].  Reviewed previous lab work and discussed plan of care.  Check blood work today. Cabenuva injection administered with no adverse side effects or complications.  Plan for follow-up in 2 months or sooner if needed with lab work  on the same day if needed.      Relevant Orders   Comprehensive metabolic panel   HIV-1 RNA quant-no reflex-bld   T-helper cell (CD4)- ([facility] clinic only)   Healthcare maintenance    Discussed importance of safe sexual practices and condom use.  Condoms offered. Due for routine dental care which she will schedule independently. Breast cancer, cervical cancer, and colon cancer screenings are up-to-date per recommendations.        I am having Jacqueline Dawson maintain her multivitamins ther.  w/minerals, valACYclovir, norethindrone, naproxen, triamcinolone, ketoconazole, Myfembree, traZODone, hydrOXYzine, and cabotegravir & rilpivirine ER.   Follow-up: Return in about 2 months (around 10/23/2021).   Terri Piedra, MSN, FNP-C Nurse Practitioner [facility] for Infectious Disease [place] number: [phone]

## 2021-08-22 NOTE — Assessment & Plan Note (Signed)
Ms. Toren continues to have well-controlled virus with good adherence and tolerance to [place].  Reviewed previous lab work and discussed plan of care.  Check blood work today. Cabenuva injection administered with no adverse side effects or complications.  Plan for follow-up in 2 months or sooner if needed with lab work on the same day if needed.

## 2021-08-22 NOTE — Assessment & Plan Note (Signed)
   Discussed importance of safe sexual practices and condom use.  Condoms offered.  Due for routine dental care which she will schedule independently.  Breast cancer, cervical cancer, and colon cancer screenings are up-to-date per recommendations.

## 2021-08-22 NOTE — Patient Instructions (Addendum)
Nice to see you.  We will check your lab work today.  Plan for follow up in 2 months or sooner if needed.   Have a great day and stay safe!  

## 2021-08-23 LAB — T-HELPER CELL (CD4) - (RCID CLINIC ONLY)
CD4 % Helper T Cell: 22 % — ABNORMAL LOW (ref 33–65)
CD4 T Cell Abs: 355 /uL — ABNORMAL LOW (ref 400–1790)

## 2021-08-26 LAB — COMPREHENSIVE METABOLIC PANEL
AG Ratio: 1.9 (calc) (ref 1.0–2.5)
ALT: 11 U/L (ref 6–29)
AST: 14 U/L (ref 10–35)
Albumin: 4.7 g/dL (ref 3.6–5.1)
Alkaline phosphatase (APISO): 44 U/L (ref 31–125)
BUN: 15 mg/dL (ref 7–25)
CO2: 28 mmol/L (ref 20–32)
Calcium: 9.5 mg/dL (ref 8.6–10.2)
Chloride: 103 mmol/L (ref 98–110)
Creat: 0.91 mg/dL (ref 0.50–0.99)
Globulin: 2.5 g/dL (calc) (ref 1.9–3.7)
Glucose, Bld: 80 mg/dL (ref 65–99)
Potassium: 4.1 mmol/L (ref 3.5–5.3)
Sodium: 137 mmol/L (ref 135–146)
Total Bilirubin: 0.3 mg/dL (ref 0.2–1.2)
Total Protein: 7.2 g/dL (ref 6.1–8.1)

## 2021-08-26 LAB — HIV-1 RNA QUANT-NO REFLEX-BLD
HIV 1 RNA Quant: <20 Copies/mL — ABNORMAL HIGH
HIV-1 RNA Quant, Log: <1.3 Log cps/mL — ABNORMAL HIGH

## 2021-10-15 ENCOUNTER — Other Ambulatory Visit (HOSPITAL_COMMUNITY): Payer: Self-pay

## 2021-10-17 ENCOUNTER — Other Ambulatory Visit (HOSPITAL_COMMUNITY): Payer: Self-pay

## 2021-10-18 ENCOUNTER — Telehealth: Payer: Self-pay

## 2021-10-18 NOTE — Telephone Encounter (Signed)
[facility] Patient Advocate Encounter  Patient's medication Kern Reap) have been couriered to [facility] from [place] and will be administered on the patient next office visit on 10/22/21.  [name] , [facility] Patient [facility] for Infectious Disease Phone: [phone] Fax:  [phone]

## 2021-10-22 ENCOUNTER — Other Ambulatory Visit: Payer: Self-pay

## 2021-10-22 ENCOUNTER — Ambulatory Visit (INDEPENDENT_AMBULATORY_CARE_PROVIDER_SITE_OTHER): Payer: BC Managed Care – PPO | Admitting: Pharmacist

## 2021-10-22 DIAGNOSIS — B20 Human immunodeficiency virus [HIV] disease: Secondary | ICD-10-CM

## 2021-10-22 MED ORDER — CABOTEGRAVIR & RILPIVIRINE ER 600 & 900 MG/3ML IM SUER
1.0000 | Freq: Once | INTRAMUSCULAR | Status: AC
  Administered 2021-10-22: 1 via INTRAMUSCULAR

## 2021-10-22 NOTE — Progress Notes (Signed)
HPI: Jacqueline Dawson is a 46 y.o. female who presents to the [place] clinic for [place] administration.  Patient Active Problem List   Diagnosis Date Noted   Low libido 02/22/2021   Healthcare maintenance 12/17/2020   Fibroids 08/16/2020   HTN (hypertension) 01/27/2018   Adjustment disorder with anxious mood 01/15/2016   Insomnia 10/09/2010   ECTOPIC PREGNANCY 04/02/2007   Human immunodeficiency virus (HIV) disease ([place]) 11/11/2005   Depression 11/11/2005   ABFND PAP SMEAR HGSIL 11/11/2005    Patient's Medications  New Prescriptions   No medications on file  Previous Medications   CABOTEGRAVIR & RILPIVIRINE ER (CABENUVA) 600 & 900 MG/3ML INJECTION    Inject 1 kit into the muscle every 2 (two) months.   HYDROXYZINE (ATARAX) 25 MG TABLET    Take 1-2 tablets (25-50 mg total) by mouth 3 (three) times daily as needed.   KETOCONAZOLE (NIZORAL) 2 % SHAMPOO    ketoconazole 2 % shampoo   MULTIPLE VITAMINS-MINERALS (MULTIVITAMINS THER. W/MINERALS) TABS    Take 1 tablet by mouth daily.   NAPROXEN (NAPROSYN) 500 MG TABLET    naproxen 500 mg tablet  Take 1 tablet every 12 hours by oral route as needed.   NORETHINDRONE (AYGESTIN) 5 MG TABLET       RELUGOLIX-ESTRADIOL-NORETHIND (MYFEMBREE) 40-1-0.5 MG TABS       TRAZODONE (DESYREL) 50 MG TABLET    TAKE 1/2 TABLET (25 MG TOTAL) BY MOUTH AT BEDTIME AS NEEDED FOR SLEEP.   TRIAMCINOLONE (KENALOG) 0.025 % CREAM    SMARTSIG:2 Topical Twice Daily   VALACYCLOVIR (VALTREX) 500 MG TABLET    valacyclovir 500 mg tablet  TAKE 1 TABLET BY MOUTH EVERY DAY  Modified Medications   No medications on file  Discontinued Medications   No medications on file    Allergies: Allergies  Allergen Reactions   Sulfonamide Derivatives Rash    Past Medical History: Past Medical History:  Diagnosis Date   Anxiety    Depression    HIV positive ([place])     Social History: Social History   Socioeconomic History   Marital status: Divorced    Spouse  name: Not on file   Number of children: Not on file   Years of education: Not on file   Highest education level: Not on file  Occupational History   Not on file  Tobacco Use   Smoking status: Every Day    Packs/day: 0.10    Years: 16.00    Total pack years: 1.60    Types: Cigarettes    Last attempt to quit: 01/28/2011    Years since quitting: 10.7   Smokeless tobacco: Never   Tobacco comments:    Reports she is down to 2 cigarettes a day   Vaping Use   Vaping Use: Never used  Substance and Sexual Activity   Alcohol use: Yes    Alcohol/week: 7.0 standard drinks of alcohol    Types: 7 Glasses of wine per week    Comment: glass of wine a day per pt   Drug use: Yes    Types: Marijuana    Comment: occ--11/05/20- states has been over a week   Sexual activity: Not Currently    Partners: Male    Birth control/protection: Surgical  Other Topics Concern   Not on file  Social History Narrative   Not on file   Social Determinants of Health   Financial Resource Strain: Not on file  Food Insecurity: Not on file  Transportation  Needs: Not on file  Physical Activity: Not on file  Stress: Not on file  Social Connections: Not on file    Labs: Lab Results  Component Value Date   HIV1RNAQUANT <20 (H) 08/22/2021   HIV1RNAQUANT NOT DETECTED 06/20/2021   HIV1RNAQUANT <20 (H) 04/23/2021   CD4TABS 355 (L) 08/22/2021   CD4TABS 365 (L) 12/17/2020   CD4TABS 428 10/23/2020    RPR and STI Lab Results  Component Value Date   LABRPR NON-REACTIVE 06/19/2020   LABRPR NON-REACTIVE 01/16/2020   LABRPR NON-REACTIVE 05/27/2019   LABRPR NON-REACTIVE 07/29/2018   LABRPR NON-REACTIVE 01/13/2018    STI Results GC CT  06/19/2020  4:47 PM Negative  Negative   01/16/2020  4:15 PM Negative  Negative   07/29/2018 12:00 AM Negative  Negative   01/13/2018 12:00 AM Negative  Negative   07/11/2015 12:00 AM Negative  Negative   01/08/2015 12:00 AM Negative  Negative   12/29/2014 12:00 AM  Negative  Negative     Hepatitis B Lab Results  Component Value Date   HEPBSAB REACTIVE (A) 06/20/2021   HEPBSAG NO 04/06/2006   Hepatitis C No results found for: "HEPCAB", "HCVRNAPCRQN" Hepatitis A No results found for: "HAV" Lipids: Lab Results  Component Value Date   CHOL 176 12/17/2020   TRIG 87 12/17/2020   HDL 72 12/17/2020   CHOLHDL 2.4 12/17/2020   VLDL 12 07/16/2016   LDLCALC 86 12/17/2020    TARGET DATE: The 11th  Assessment: Jacqueline Dawson presents today for her maintenance Cabenuva injections. Past injections were tolerated well without issues. She is having issues with her eczema and darkening of the skin on her legs. She is using steroid cream daily. No new moisturizers or lotions. She is requesting an appointment with Marya Amsler to review. Will schedule that for her.  Administered cabotegravir 653m/3mL in left upper outer quadrant of the gluteal muscle. Administered rilpivirine 900 mg/347min the right upper outer quadrant of the gluteal muscle. No issues with injections. She will follow up in 2 months for next set of injections.  Plan: - Cabenuva injections administered - Next injections scheduled for 12/17/21 - F/u with GrMarya Amslern 10/6 - Call with any issues or questions  Ronon Ferger L. Dyland Panuco, PharmD, BCIDP, AAHIVP, CPLivingstonlinical Pharmacist Practitioner [facility]

## 2021-10-28 ENCOUNTER — Ambulatory Visit: Payer: BC Managed Care – PPO | Admitting: Family

## 2021-11-15 ENCOUNTER — Ambulatory Visit (INDEPENDENT_AMBULATORY_CARE_PROVIDER_SITE_OTHER): Payer: BC Managed Care – PPO | Admitting: Family

## 2021-11-15 ENCOUNTER — Encounter: Payer: Self-pay | Admitting: Family

## 2021-11-15 ENCOUNTER — Other Ambulatory Visit: Payer: Self-pay

## 2021-11-15 VITALS — BP 143/94 | HR 83 | Temp 97.9°F | Ht 66.0 in | Wt 139.0 lb

## 2021-11-15 DIAGNOSIS — B20 Human immunodeficiency virus [HIV] disease: Secondary | ICD-10-CM | POA: Diagnosis not present

## 2021-11-15 DIAGNOSIS — L309 Dermatitis, unspecified: Secondary | ICD-10-CM | POA: Diagnosis not present

## 2021-11-15 DIAGNOSIS — R5383 Other fatigue: Secondary | ICD-10-CM | POA: Diagnosis not present

## 2021-11-15 DIAGNOSIS — F32A Depression, unspecified: Secondary | ICD-10-CM | POA: Insufficient documentation

## 2021-11-15 MED ORDER — TRIAMCINOLONE ACETONIDE 0.1 % EX CREA: 1.0000 | TOPICAL_CREAM | Freq: Two times a day (BID) | CUTANEOUS | 0 refills | Status: AC

## 2021-11-15 MED ORDER — KETOCONAZOLE 2 % EX SHAM: MEDICATED_SHAMPOO | CUTANEOUS | 1 refills | Status: DC

## 2021-11-15 NOTE — Patient Instructions (Addendum)
Nice to see you.  Start using the triamcinolone cream. Use twice daily to affected areas for up to 2 weeks.   Moisturize with emollient cream like Eurcerin and use a moisturizing lotion.  If your symptoms do not improve we can send you to Dermatology.   Have a great day and stay safe!

## 2021-11-15 NOTE — Assessment & Plan Note (Signed)
Jacqueline Dawson has signs/symptoms consistent with eczema exacerbation. Will increase strength of triamcinolone cream to 0.1% and have encourage emulsion creams and lotions to help with moisturizing. If symptoms worsen or do not improve may require further evaluation by dermatology.

## 2021-11-15 NOTE — Assessment & Plan Note (Signed)
Well controlled with current dose of Cabenuva.

## 2021-11-15 NOTE — Progress Notes (Signed)
Subjective:    Patient ID: Jacqueline Dawson, female    DOB: 03-27-1975, 46 y.o.   MRN: [id number]  Chief Complaint  Patient presents with   Follow-up    HPI:  HEATHERLY STENNER is a 46 y.o. female with HIV disease last seen on 08/22/21 with well controlled virus and good adherence and tolerance to [place]. Presents today for an acute office visit.   Renell has been having increasing outbreak of eczema located on her anterior, bilateral shins with some areas on her abdomen that have been refractory to previously prescribed triamcinolone 0.025% cream and lotions. Skin is dry, itchy with some hyperpigmentation. Requesting refill of ketoconazole shampoo as well.     Allergies  Allergen Reactions   Sulfonamide Derivatives Rash      Outpatient Medications Prior to Visit  Medication Sig Dispense Refill   cabotegravir & rilpivirine ER (CABENUVA) 600 & 900 MG/3ML injection Inject 1 kit into the muscle every 2 (two) months. 6 mL 5   hydrOXYzine (ATARAX) 25 MG tablet Take 1-2 tablets (25-50 mg total) by mouth 3 (three) times daily as needed. 60 tablet 3   Multiple Vitamins-Minerals (MULTIVITAMINS THER. W/MINERALS) TABS Take 1 tablet by mouth daily.     naproxen (NAPROSYN) 500 MG tablet naproxen 500 mg tablet  Take 1 tablet every 12 hours by oral route as needed.     traZODone (DESYREL) 50 MG tablet TAKE 1/2 TABLET (25 MG TOTAL) BY MOUTH AT BEDTIME AS NEEDED FOR SLEEP. 30 tablet 3   valACYclovir (VALTREX) 500 MG tablet valacyclovir 500 mg tablet  TAKE 1 TABLET BY MOUTH EVERY DAY     triamcinolone (KENALOG) 0.025 % cream SMARTSIG:2 Topical Twice Daily     norethindrone (AYGESTIN) 5 MG tablet  (Patient not taking: Reported on 11/15/2021)     Relugolix-Estradiol-Norethind (MYFEMBREE) 40-1-0.5 MG TABS  (Patient not taking: Reported on 11/15/2021)     ketoconazole (NIZORAL) 2 % shampoo ketoconazole 2 % shampoo (Patient not taking: Reported on 11/15/2021)     No facility-administered medications  prior to visit.     Past Medical History:  Diagnosis Date   Anxiety    Depression    HIV positive ([place])      Past Surgical History:  Procedure Laterality Date   COLONOSCOPY  11/05/2020   LAPAROSCOPIC OVARIAN CYSTECTOMY Left 12/13/2015   Procedure: LAPAROSCOPIC OVARIAN CYSTECTOMY;  Surgeon: Everett Graff, MD;  Location: [facility];  Service: Gynecology;  Laterality: Left;   TUBAL LIGATION     WISDOM TOOTH EXTRACTION         Review of Systems  Constitutional:  Negative for chills, diaphoresis, fatigue and fever.  Respiratory:  Negative for cough, chest tightness, shortness of breath and wheezing.   Cardiovascular:  Negative for chest pain.  Gastrointestinal:  Negative for abdominal pain, diarrhea, nausea and vomiting.  Skin:  Positive for rash.      Objective:    BP (!) 143/94 Comment: Provider notified  Pulse 83   Temp 97.9 F (36.6 C) (Oral)   Ht _0  (1.676 m)   Wt 139 lb (63 kg)   SpO2 100%   BMI 22.44 kg/m  Nursing note and vital signs reviewed.  Physical Exam Constitutional:      General: She is not in acute distress.    Appearance: She is well-developed.  Cardiovascular:     Rate and Rhythm: Normal rate and regular rhythm.     Heart sounds: Normal heart sounds.  Pulmonary:  Effort: Pulmonary effort is normal.     Breath sounds: Normal breath sounds.  Skin:    General: Skin is warm and dry.     Comments: 2 areas of hyperpigmentation with eczematic appearance located on bilateral shins. Distal pulses and sensation are intact.   Neurological:     Mental Status: She is alert and oriented to person, place, and time.  Psychiatric:        Behavior: Behavior normal.        Thought Content: Thought content normal.        Judgment: Judgment normal.         11/15/2021   10:24 AM 02/22/2021    9:23 AM 10/23/2020    2:19 PM 08/16/2020    4:34 PM 01/31/2020    3:44 PM  Depression screen PHQ 2/9  Decreased Interest 0 0 0 0 0  Down, Depressed, Hopeless 0  1 0 0 0  PHQ - 2 Score 0 1 0 0 0       Assessment & Plan:    Patient Active Problem List   Diagnosis Date Noted   Eczema 11/15/2021   Other fatigue 11/15/2021   Low libido 02/22/2021   Healthcare maintenance 12/17/2020   Fibroids 08/16/2020   HTN (hypertension) 01/27/2018   Adjustment disorder with anxious mood 01/15/2016   Insomnia 10/09/2010   ECTOPIC PREGNANCY 04/02/2007   Human immunodeficiency virus (HIV) disease ([place]) 11/11/2005   Depression 11/11/2005   ABFND PAP SMEAR HGSIL 11/11/2005     Problem List Items Addressed This Visit       Musculoskeletal and Integument   Eczema    Ms. Conerly has signs/symptoms consistent with eczema exacerbation. Will increase strength of triamcinolone cream to 0.1% and have encourage emulsion creams and lotions to help with moisturizing. If symptoms worsen or do not improve may require further evaluation by dermatology.         Other   Human immunodeficiency virus (HIV) disease ([place])    Well controlled with current dose of Cabenuva.       Relevant Medications   ketoconazole (NIZORAL) 2 % shampoo   Other fatigue - Primary    Having increased levels of fatigue most likely multifactorial. Requested to have Vitamin D and B12 checked. Has been working and encouraged to obtain adequate sleep and rest. No evidence of depression. Continue to monitor.       Relevant Orders   B12   Vitamin D 1,25 dihydroxy     I have discontinued Analie O. Bossler's triamcinolone. I am also having her start on triamcinolone cream. Additionally, I am having her maintain her multivitamins ther. w/minerals, valACYclovir, norethindrone, naproxen, Myfembree, traZODone, hydrOXYzine, cabotegravir & rilpivirine ER, and ketoconazole.   Meds ordered this encounter  Medications   triamcinolone cream (KENALOG) 0.1 %    Sig: Apply 1 Application topically 2 (two) times daily.    Dispense:  80 g    Refill:  0    Order Specific Question:   Supervising Provider     Answer:   Baxter Flattery, CYNTHIA [[id number]]   ketoconazole (NIZORAL) 2 % shampoo    Sig: ketoconazole 2 % shampoo    Dispense:  120 mL    Refill:  1    Order Specific Question:   Supervising Provider    Answer:   Carlyle Basques [[id number]]     Follow-up: Return if symptoms worsen or fail to improve.   Terri Piedra, MSN, FNP-C Nurse Practitioner [facility] for Infectious Disease [facility] Main number: [phone]

## 2021-11-15 NOTE — Assessment & Plan Note (Addendum)
Having increased levels of fatigue most likely multifactorial. Requested to have Vitamin D and B12 checked. Has been working and encouraged to obtain adequate sleep and rest. No evidence of depression. Continue to monitor.

## 2021-11-19 ENCOUNTER — Other Ambulatory Visit: Payer: Self-pay | Admitting: Obstetrics and Gynecology

## 2021-12-03 MED ORDER — HYDROXYZINE HCL 25 MG PO TABS: 25.0000 mg | ORAL_TABLET | Freq: Three times a day (TID) | ORAL | 3 refills | Status: DC | PRN

## 2021-12-05 ENCOUNTER — Other Ambulatory Visit: Payer: Self-pay | Admitting: Infectious Diseases

## 2021-12-05 DIAGNOSIS — G4700 Insomnia, unspecified: Secondary | ICD-10-CM

## 2021-12-05 NOTE — Telephone Encounter (Signed)
Please advise on refill.

## 2021-12-06 ENCOUNTER — Telehealth: Payer: Self-pay

## 2021-12-06 NOTE — Telephone Encounter (Signed)
[facility] Patient Advocate Encounter  Patient's medication Kern Reap) have been couriered to [facility] from [place] and will be administered on the patient next office visit on 12/17/21.  [name] , [facility] Patient [facility] for Infectious Disease Phone: [phone] Fax:  [phone]

## 2021-12-15 NOTE — Progress Notes (Unsigned)
HPI: Jacqueline Dawson is a 46 y.o. female who presents to the [facility] for [place] administration.  Patient Active Problem List   Diagnosis Date Noted   Eczema 11/15/2021   Other fatigue 11/15/2021   Low libido 02/22/2021   Healthcare maintenance 12/17/2020   Fibroids 08/16/2020   HTN (hypertension) 01/27/2018   Adjustment disorder with anxious mood 01/15/2016   Insomnia 10/09/2010   ECTOPIC PREGNANCY 04/02/2007   Human immunodeficiency virus (HIV) disease ([place]) 11/11/2005   Depression 11/11/2005   ABFND PAP SMEAR HGSIL 11/11/2005    Patient's Medications  New Prescriptions   No medications on file  Previous Medications   CABOTEGRAVIR & RILPIVIRINE ER (CABENUVA) 600 & 900 MG/3ML INJECTION    Inject 1 kit into the muscle every 2 (two) months.   HYDROXYZINE (ATARAX) 25 MG TABLET    Take 1-2 tablets (25-50 mg total) by mouth 3 (three) times daily as needed.   KETOCONAZOLE (NIZORAL) 2 % SHAMPOO    ketoconazole 2 % shampoo   MULTIPLE VITAMINS-MINERALS (MULTIVITAMINS THER. W/MINERALS) TABS    Take 1 tablet by mouth daily.   NAPROXEN (NAPROSYN) 500 MG TABLET    naproxen 500 mg tablet  Take 1 tablet every 12 hours by oral route as needed.   TRAZODONE (DESYREL) 50 MG TABLET    TAKE 1/2 TABLET (25 MG TOTAL) BY MOUTH AT BEDTIME AS NEEDED FOR SLEEP.   TRIAMCINOLONE CREAM (KENALOG) 0.1 %    Apply 1 Application topically 2 (two) times daily.   VALACYCLOVIR (VALTREX) 500 MG TABLET    valacyclovir 500 mg tablet  TAKE 1 TABLET BY MOUTH EVERY DAY  Modified Medications   No medications on file  Discontinued Medications   No medications on file    Allergies: Allergies  Allergen Reactions   Sulfonamide Derivatives Rash    Past Medical History: Past Medical History:  Diagnosis Date   Anxiety    Depression    HIV positive ([place])     Social History: Social History   Socioeconomic History   Marital status: Divorced    Spouse name: Not on file   Number of children: Not  on file   Years of education: Not on file   Highest education level: Not on file  Occupational History   Not on file  Tobacco Use   Smoking status: Every Day    Packs/day: 0.10    Years: 16.00    Total pack years: 1.60    Types: Cigarettes    Last attempt to quit: 01/28/2011    Years since quitting: 10.8   Smokeless tobacco: Never   Tobacco comments:    Reports she is down to 2 cigarettes a day   Vaping Use   Vaping Use: Never used  Substance and Sexual Activity   Alcohol use: Yes    Alcohol/week: 7.0 standard drinks of alcohol    Types: 7 Glasses of wine per week    Comment: glass of wine a day per pt   Drug use: Not Currently    Types: Marijuana    Comment: occ--11/05/20- states has been over a week   Sexual activity: Not Currently    Partners: Male    Birth control/protection: Surgical    Comment: declined condoms  Other Topics Concern   Not on file  Social History Narrative   Not on file   Social Determinants of Health   Financial Resource Strain: Not on file  Food Insecurity: Not on file  Transportation Needs: Not  on file  Physical Activity: Not on file  Stress: Not on file  Social Connections: Not on file    Labs: Lab Results  Component Value Date   HIV1RNAQUANT <20 (H) 08/22/2021   HIV1RNAQUANT NOT DETECTED 06/20/2021   HIV1RNAQUANT <20 (H) 04/23/2021   CD4TABS 355 (L) 08/22/2021   CD4TABS 365 (L) 12/17/2020   CD4TABS 428 10/23/2020    RPR and STI Lab Results  Component Value Date   LABRPR NON-REACTIVE 06/19/2020   LABRPR NON-REACTIVE 01/16/2020   LABRPR NON-REACTIVE 05/27/2019   LABRPR NON-REACTIVE 07/29/2018   LABRPR NON-REACTIVE 01/13/2018    STI Results GC CT  06/19/2020  4:47 PM Negative  Negative   01/16/2020  4:15 PM Negative  Negative   07/29/2018 12:00 AM Negative  Negative   01/13/2018 12:00 AM Negative  Negative   07/11/2015 12:00 AM Negative  Negative   01/08/2015 12:00 AM Negative  Negative   12/29/2014 12:00 AM Negative   Negative     Hepatitis B Lab Results  Component Value Date   HEPBSAB REACTIVE (A) 06/20/2021   HEPBSAG NO 04/06/2006   Hepatitis C No results found for: "HEPCAB", "HCVRNAPCRQN" Hepatitis A No results found for: "HAV" Lipids: Lab Results  Component Value Date   CHOL 176 12/17/2020   TRIG 87 12/17/2020   HDL 72 12/17/2020   CHOLHDL 2.4 12/17/2020   VLDL 12 07/16/2016   LDLCALC 86 12/17/2020    TARGET DATE: 11th of each month   Assessment: Rani presents today for her maintenance Cabenuva injections. Past injections were tolerated well without issues.  Patient offered condoms during today's visit and   Administered cabotegravir 646m/3mL in left upper outer quadrant of the gluteal muscle. Administered rilpivirine 900 mg/370min the right upper outer quadrant of the gluteal muscle. No issues with injections. She will follow up in 2 months for next set of injections.  Patient asked if she would like to receive influenza vaccination during today's visit.   STI screening?   Plan: - Cabenuva injections administered - Next injections scheduled for *** - Administer Havrix dose #2/2 at next visit  - Call with any issues or questions  AuAdria DillPharmD PGY-2 Infectious Diseases Resident  12/15/2021 3:28 PM

## 2021-12-17 ENCOUNTER — Ambulatory Visit: Payer: BC Managed Care – PPO | Admitting: Pharmacist

## 2021-12-24 ENCOUNTER — Other Ambulatory Visit: Payer: Self-pay

## 2021-12-24 ENCOUNTER — Ambulatory Visit (INDEPENDENT_AMBULATORY_CARE_PROVIDER_SITE_OTHER): Payer: BC Managed Care – PPO | Admitting: Pharmacist

## 2021-12-24 DIAGNOSIS — B20 Human immunodeficiency virus [HIV] disease: Secondary | ICD-10-CM | POA: Diagnosis not present

## 2021-12-24 MED ORDER — CABOTEGRAVIR & RILPIVIRINE ER 600 & 900 MG/3ML IM SUER
1.0000 | Freq: Once | INTRAMUSCULAR | Status: AC
  Administered 2021-12-24: 1 via INTRAMUSCULAR

## 2021-12-24 NOTE — Progress Notes (Signed)
HPI: Jacqueline Dawson is a 46 y.o. female who presents to the [facility] for [place] administration.  Patient Active Problem List   Diagnosis Date Noted   Eczema 11/15/2021   Other fatigue 11/15/2021   Low libido 02/22/2021   Healthcare maintenance 12/17/2020   Fibroids 08/16/2020   HTN (hypertension) 01/27/2018   Adjustment disorder with anxious mood 01/15/2016   Insomnia 10/09/2010   ECTOPIC PREGNANCY 04/02/2007   Human immunodeficiency virus (HIV) disease ([place]) 11/11/2005   Depression 11/11/2005   ABFND PAP SMEAR HGSIL 11/11/2005    Patient's Medications  New Prescriptions   No medications on file  Previous Medications   CABOTEGRAVIR & RILPIVIRINE ER (CABENUVA) 600 & 900 MG/3ML INJECTION    Inject 1 kit into the muscle every 2 (two) months.   HYDROXYZINE (ATARAX) 25 MG TABLET    Take 1-2 tablets (25-50 mg total) by mouth 3 (three) times daily as needed.   KETOCONAZOLE (NIZORAL) 2 % SHAMPOO    ketoconazole 2 % shampoo   MULTIPLE VITAMINS-MINERALS (MULTIVITAMINS THER. W/MINERALS) TABS    Take 1 tablet by mouth daily.   NAPROXEN (NAPROSYN) 500 MG TABLET    naproxen 500 mg tablet  Take 1 tablet every 12 hours by oral route as needed.   TRAZODONE (DESYREL) 50 MG TABLET    TAKE 1/2 TABLET (25 MG TOTAL) BY MOUTH AT BEDTIME AS NEEDED FOR SLEEP.   TRIAMCINOLONE CREAM (KENALOG) 0.1 %    Apply 1 Application topically 2 (two) times daily.   VALACYCLOVIR (VALTREX) 500 MG TABLET    valacyclovir 500 mg tablet  TAKE 1 TABLET BY MOUTH EVERY DAY  Modified Medications   No medications on file  Discontinued Medications   No medications on file    Allergies: Allergies  Allergen Reactions   Sulfonamide Derivatives Rash    Past Medical History: Past Medical History:  Diagnosis Date   Anxiety    Depression    HIV positive ([place])     Social History: Social History   Socioeconomic History   Marital status: Divorced    Spouse name: Not on file   Number of children: Not  on file   Years of education: Not on file   Highest education level: Not on file  Occupational History   Not on file  Tobacco Use   Smoking status: Every Day    Packs/day: 0.10    Years: 16.00    Total pack years: 1.60    Types: Cigarettes    Last attempt to quit: 01/28/2011    Years since quitting: 10.9   Smokeless tobacco: Never   Tobacco comments:    Reports she is down to 2 cigarettes a day   Vaping Use   Vaping Use: Never used  Substance and Sexual Activity   Alcohol use: Yes    Alcohol/week: 7.0 standard drinks of alcohol    Types: 7 Glasses of wine per week    Comment: glass of wine a day per pt   Drug use: Not Currently    Types: Marijuana    Comment: occ--11/05/20- states has been over a week   Sexual activity: Not Currently    Partners: Male    Birth control/protection: Surgical    Comment: declined condoms  Other Topics Concern   Not on file  Social History Narrative   Not on file   Social Determinants of Health   Financial Resource Strain: Not on file  Food Insecurity: Not on file  Transportation Needs: Not  on file  Physical Activity: Not on file  Stress: Not on file  Social Connections: Not on file    Labs: Lab Results  Component Value Date   HIV1RNAQUANT <20 (H) 08/22/2021   HIV1RNAQUANT NOT DETECTED 06/20/2021   HIV1RNAQUANT <20 (H) 04/23/2021   CD4TABS 355 (L) 08/22/2021   CD4TABS 365 (L) 12/17/2020   CD4TABS 428 10/23/2020    RPR and STI Lab Results  Component Value Date   LABRPR NON-REACTIVE 06/19/2020   LABRPR NON-REACTIVE 01/16/2020   LABRPR NON-REACTIVE 05/27/2019   LABRPR NON-REACTIVE 07/29/2018   LABRPR NON-REACTIVE 01/13/2018    STI Results GC CT  06/19/2020  4:47 PM Negative  Negative   01/16/2020  4:15 PM Negative  Negative   07/29/2018 12:00 AM Negative  Negative   01/13/2018 12:00 AM Negative  Negative   07/11/2015 12:00 AM Negative  Negative   01/08/2015 12:00 AM Negative  Negative   12/29/2014 12:00 AM Negative   Negative     Hepatitis B Lab Results  Component Value Date   HEPBSAB REACTIVE (A) 06/20/2021   HEPBSAG NO 04/06/2006   Hepatitis C No results found for: "HEPCAB", "HCVRNAPCRQN" Hepatitis A No results found for: "HAV" Lipids: Lab Results  Component Value Date   CHOL 176 12/17/2020   TRIG 87 12/17/2020   HDL 72 12/17/2020   CHOLHDL 2.4 12/17/2020   VLDL 12 07/16/2016   LDLCALC 86 12/17/2020    TARGET DATE: 11th of each month  Assessment: Sula presents today for her maintenance Cabenuva injections. Past injections were tolerated well without issues.Patient offered condoms during visit and politely declined. STI screening also declined. Denies any STI symptoms or new partners.   Administered cabotegravir 644m/3mL in left upper outer quadrant of the gluteal muscle. Administered rilpivirine 900 mg/336min the right upper outer quadrant of the gluteal muscle. No issues with injections. She will follow up in 2 months for next set of injections.  Patient has already received the flu vaccine. She is not interested in receiving the COVID vaccine today.   Patient states her eczema is much improved and the hyperpigmentation of her skin is lightening. She states she would be interested in following up with GrMarya Amsleror this issue.   Plan: - Cabenuva injections administered - F/U HIV RNA, lipid panel  - Next injections scheduled for 02/14/22 with GrTerri PiedraNP and 04/22/22 with Cassie  - Call with any issues or questions  AuAdria DillPharmD PGY-2 Infectious Diseases Resident  12/24/2021 9:45 AM

## 2021-12-27 LAB — HIV-1 RNA QUANT-NO REFLEX-BLD
HIV 1 RNA Quant: NOT DETECTED Copies/mL
HIV-1 RNA Quant, Log: NOT DETECTED Log cps/mL

## 2021-12-27 LAB — LIPID PANEL
Cholesterol: 194 mg/dL (ref <200)
HDL: 69 mg/dL (ref >=50)
LDL Cholesterol (Calc): 101 mg/dL (calc) — ABNORMAL HIGH
Non-HDL Cholesterol (Calc): 125 mg/dL (calc) (ref <130)
Total CHOL/HDL Ratio: 2.8 (calc) (ref <5.0)
Triglycerides: 147 mg/dL (ref <150)

## 2022-01-13 ENCOUNTER — Other Ambulatory Visit: Payer: Self-pay | Admitting: Family

## 2022-01-13 NOTE — Telephone Encounter (Signed)
Please advise if okay to refill. 

## 2022-01-14 ENCOUNTER — Encounter (HOSPITAL_BASED_OUTPATIENT_CLINIC_OR_DEPARTMENT_OTHER): Payer: Self-pay | Admitting: Obstetrics and Gynecology

## 2022-01-14 NOTE — Progress Notes (Signed)
Spoke w/ via phone for pre-op interview--- [name]----  CBC, UPT and BMP per Psychologist, sport and exercise.             Lab results------ COVID test -----patient states asymptomatic no test needed Arrive at -------0800 NPO after MN NO Solid Food.  Clear liquids from MN until---0700 Med rec completed Medications to take morning of surgery -----NONE Diabetic medication ----- Patient instructed no nail polish to be worn day of surgery Patient instructed to bring photo id and insurance card day of surgery Patient aware to have Driver (ride ) / caregiver  Husband Jacqueline Dawson  for 24 hours after surgery  Patient Special Instructions ----- Pre-Op special Istructions ----- Patient verbalized understanding of instructions that were given at this phone interview. Patient denies shortness of breath, chest pain, fever, cough at this phone interview.

## 2022-01-17 ENCOUNTER — Encounter (HOSPITAL_BASED_OUTPATIENT_CLINIC_OR_DEPARTMENT_OTHER): Payer: Self-pay | Admitting: Obstetrics and Gynecology

## 2022-01-17 ENCOUNTER — Ambulatory Visit (HOSPITAL_BASED_OUTPATIENT_CLINIC_OR_DEPARTMENT_OTHER)
Admission: RE | Admit: 2022-01-17 | Discharge: 2022-01-17 | Disposition: A | Discharge status: Home / Self Care | Payer: BC Managed Care – PPO | Attending: Obstetrics and Gynecology | Admitting: Obstetrics and Gynecology

## 2022-01-17 ENCOUNTER — Ambulatory Visit (HOSPITAL_BASED_OUTPATIENT_CLINIC_OR_DEPARTMENT_OTHER): Payer: BC Managed Care – PPO | Admitting: Anesthesiology

## 2022-01-17 ENCOUNTER — Encounter (HOSPITAL_BASED_OUTPATIENT_CLINIC_OR_DEPARTMENT_OTHER)
Admission: RE | Disposition: A | Discharge status: Home / Self Care | Payer: Self-pay | Source: Home / Self Care | Attending: Obstetrics and Gynecology

## 2022-01-17 ENCOUNTER — Other Ambulatory Visit: Payer: Self-pay

## 2022-01-17 DIAGNOSIS — I10 Essential (primary) hypertension: Secondary | ICD-10-CM | POA: Diagnosis not present

## 2022-01-17 DIAGNOSIS — F418 Other specified anxiety disorders: Secondary | ICD-10-CM | POA: Diagnosis not present

## 2022-01-17 DIAGNOSIS — Z21 Asymptomatic human immunodeficiency virus [HIV] infection status: Secondary | ICD-10-CM | POA: Insufficient documentation

## 2022-01-17 DIAGNOSIS — F17210 Nicotine dependence, cigarettes, uncomplicated: Secondary | ICD-10-CM | POA: Diagnosis not present

## 2022-01-17 DIAGNOSIS — D270 Benign neoplasm of right ovary: Secondary | ICD-10-CM | POA: Insufficient documentation

## 2022-01-17 HISTORY — PX: LAPAROSCOPIC OVARIAN CYSTECTOMY: SHX6248

## 2022-01-17 LAB — BASIC METABOLIC PANEL
Anion gap: 9 (ref 5–15)
BUN: 18 mg/dL (ref 6–20)
CO2: 25 mmol/L (ref 22–32)
Calcium: 9.3 mg/dL (ref 8.9–10.3)
Chloride: 101 mmol/L (ref 98–111)
Creatinine, Ser: 0.89 mg/dL (ref 0.44–1.00)
GFR, Estimated: >60 mL/min (ref >60)
Glucose, Bld: 89 mg/dL (ref 70–99)
Potassium: 3.8 mmol/L (ref 3.5–5.1)
Sodium: 135 mmol/L (ref 135–145)

## 2022-01-17 LAB — CBC
HCT: 39.7 % (ref 36.0–46.0)
Hemoglobin: 12.5 g/dL (ref 12.0–15.0)
MCH: 27.5 pg (ref 26.0–34.0)
MCHC: 31.5 g/dL (ref 30.0–36.0)
MCV: 87.3 fL (ref 80.0–100.0)
Platelets: 228 10*3/uL (ref 150–400)
RBC: 4.55 MIL/uL (ref 3.87–5.11)
RDW: 12.2 % (ref 11.5–15.5)
WBC: 5.6 10*3/uL (ref 4.0–10.5)
nRBC: 0 % (ref 0.0–0.2)

## 2022-01-17 LAB — POCT PREGNANCY, URINE: Preg Test, Ur: NEGATIVE

## 2022-01-17 SURGERY — EXCISION, CYST, OVARY, LAPAROSCOPIC
Anesthesia: General | Site: Abdomen | Laterality: Right

## 2022-01-17 MED ORDER — CEFAZOLIN SODIUM-DEXTROSE 2-4 GM/100ML-% IV SOLN
2.0000 g | INTRAVENOUS | Status: AC
  Administered 2022-01-17: 2 g via INTRAVENOUS

## 2022-01-17 MED ORDER — SUGAMMADEX SODIUM 200 MG/2ML IV SOLN
INTRAVENOUS | Status: DC | PRN
  Administered 2022-01-17: 200 mg via INTRAVENOUS

## 2022-01-17 MED ORDER — PROPOFOL 10 MG/ML IV BOLUS
INTRAVENOUS | Status: AC
  Filled 2022-01-17: qty 20

## 2022-01-17 MED ORDER — DEXAMETHASONE SODIUM PHOSPHATE 10 MG/ML IJ SOLN
INTRAMUSCULAR | Status: AC
  Filled 2022-01-17: qty 1

## 2022-01-17 MED ORDER — ONDANSETRON HCL 4 MG/2ML IJ SOLN
INTRAMUSCULAR | Status: AC
  Filled 2022-01-17: qty 2

## 2022-01-17 MED ORDER — FENTANYL CITRATE (PF) 100 MCG/2ML IJ SOLN
INTRAMUSCULAR | Status: AC
  Filled 2022-01-17: qty 2

## 2022-01-17 MED ORDER — LIDOCAINE HCL (PF) 2 % IJ SOLN
INTRAMUSCULAR | Status: AC
  Filled 2022-01-17: qty 5

## 2022-01-17 MED ORDER — FENTANYL CITRATE (PF) 250 MCG/5ML IJ SOLN
INTRAMUSCULAR | Status: AC
  Filled 2022-01-17: qty 5

## 2022-01-17 MED ORDER — SCOPOLAMINE 1 MG/3DAYS TD PT72
1.0000 | MEDICATED_PATCH | Freq: Once | TRANSDERMAL | Status: DC
  Administered 2022-01-17: 1.5 mg via TRANSDERMAL

## 2022-01-17 MED ORDER — KETOROLAC TROMETHAMINE 30 MG/ML IJ SOLN
INTRAMUSCULAR | Status: AC
  Filled 2022-01-17: qty 1

## 2022-01-17 MED ORDER — ONDANSETRON HCL 4 MG/2ML IJ SOLN
INTRAMUSCULAR | Status: DC | PRN
  Administered 2022-01-17: 4 mg via INTRAVENOUS

## 2022-01-17 MED ORDER — LACTATED RINGERS IV SOLN: INTRAVENOUS | Status: DC

## 2022-01-17 MED ORDER — FENTANYL CITRATE (PF) 100 MCG/2ML IJ SOLN
25.0000 ug | INTRAMUSCULAR | Status: DC | PRN
  Administered 2022-01-17 (×2): 25 ug via INTRAVENOUS

## 2022-01-17 MED ORDER — ROCURONIUM BROMIDE 10 MG/ML (PF) SYRINGE
PREFILLED_SYRINGE | INTRAVENOUS | Status: AC
  Filled 2022-01-17: qty 10

## 2022-01-17 MED ORDER — SCOPOLAMINE 1 MG/3DAYS TD PT72
MEDICATED_PATCH | TRANSDERMAL | Status: AC
  Filled 2022-01-17: qty 1

## 2022-01-17 MED ORDER — LIDOCAINE 2% (20 MG/ML) 5 ML SYRINGE
INTRAMUSCULAR | Status: DC | PRN
  Administered 2022-01-17: 60 mg via INTRAVENOUS

## 2022-01-17 MED ORDER — ROCURONIUM BROMIDE 10 MG/ML (PF) SYRINGE
PREFILLED_SYRINGE | INTRAVENOUS | Status: DC | PRN
  Administered 2022-01-17: 50 mg via INTRAVENOUS

## 2022-01-17 MED ORDER — ACETAMINOPHEN 500 MG PO TABS
1000.0000 mg | ORAL_TABLET | Freq: Once | ORAL | Status: AC
  Administered 2022-01-17: 1000 mg via ORAL

## 2022-01-17 MED ORDER — OXYCODONE HCL 5 MG PO TABS
ORAL_TABLET | ORAL | Status: AC
  Filled 2022-01-17: qty 1

## 2022-01-17 MED ORDER — KETOROLAC TROMETHAMINE 30 MG/ML IJ SOLN
INTRAMUSCULAR | Status: DC | PRN
  Administered 2022-01-17: 30 mg via INTRAVENOUS

## 2022-01-17 MED ORDER — PROPOFOL 10 MG/ML IV BOLUS
INTRAVENOUS | Status: DC | PRN
  Administered 2022-01-17: 120 mg via INTRAVENOUS

## 2022-01-17 MED ORDER — MIDAZOLAM HCL 2 MG/2ML IJ SOLN
INTRAMUSCULAR | Status: DC | PRN
  Administered 2022-01-17: 2 mg via INTRAVENOUS

## 2022-01-17 MED ORDER — POVIDONE-IODINE 10 % EX SWAB: 2.0000 | Freq: Once | CUTANEOUS | Status: DC

## 2022-01-17 MED ORDER — AMISULPRIDE (ANTIEMETIC) 5 MG/2ML IV SOLN
10.0000 mg | Freq: Once | INTRAVENOUS | Status: AC | PRN
  Administered 2022-01-17: 10 mg via INTRAVENOUS

## 2022-01-17 MED ORDER — PHENYLEPHRINE 80 MCG/ML (10ML) SYRINGE FOR IV PUSH (FOR BLOOD PRESSURE SUPPORT)
PREFILLED_SYRINGE | INTRAVENOUS | Status: AC
  Filled 2022-01-17: qty 10

## 2022-01-17 MED ORDER — OXYCODONE HCL 5 MG/5ML PO SOLN: 5.0000 mg | Freq: Once | ORAL | Status: AC | PRN

## 2022-01-17 MED ORDER — BUPIVACAINE HCL (PF) 0.25 % IJ SOLN
INTRAMUSCULAR | Status: DC | PRN
  Administered 2022-01-17: 15 mL

## 2022-01-17 MED ORDER — PHENYLEPHRINE 80 MCG/ML (10ML) SYRINGE FOR IV PUSH (FOR BLOOD PRESSURE SUPPORT)
PREFILLED_SYRINGE | INTRAVENOUS | Status: DC | PRN
  Administered 2022-01-17 (×5): 80 ug via INTRAVENOUS

## 2022-01-17 MED ORDER — CEFAZOLIN SODIUM-DEXTROSE 2-4 GM/100ML-% IV SOLN
INTRAVENOUS | Status: AC
  Filled 2022-01-17: qty 100

## 2022-01-17 MED ORDER — AMISULPRIDE (ANTIEMETIC) 5 MG/2ML IV SOLN
INTRAVENOUS | Status: AC
  Filled 2022-01-17: qty 4

## 2022-01-17 MED ORDER — ACETAMINOPHEN 500 MG PO TABS
ORAL_TABLET | ORAL | Status: AC
  Filled 2022-01-17: qty 2

## 2022-01-17 MED ORDER — FENTANYL CITRATE (PF) 100 MCG/2ML IJ SOLN
INTRAMUSCULAR | Status: DC | PRN
  Administered 2022-01-17 (×2): 50 ug via INTRAVENOUS
  Administered 2022-01-17: 25 ug via INTRAVENOUS
  Administered 2022-01-17: 50 ug via INTRAVENOUS
  Administered 2022-01-17: 25 ug via INTRAVENOUS

## 2022-01-17 MED ORDER — DEXAMETHASONE SODIUM PHOSPHATE 10 MG/ML IJ SOLN
INTRAMUSCULAR | Status: DC | PRN
  Administered 2022-01-17 (×2): 5 mg via INTRAVENOUS

## 2022-01-17 MED ORDER — OXYCODONE HCL 5 MG PO TABS: 5.0000 mg | ORAL_TABLET | Freq: Four times a day (QID) | ORAL | 0 refills | Status: DC | PRN

## 2022-01-17 MED ORDER — OXYCODONE HCL 5 MG PO TABS
5.0000 mg | ORAL_TABLET | Freq: Once | ORAL | Status: AC | PRN
  Administered 2022-01-17: 5 mg via ORAL

## 2022-01-17 MED ORDER — MIDAZOLAM HCL 2 MG/2ML IJ SOLN
INTRAMUSCULAR | Status: AC
  Filled 2022-01-17: qty 2

## 2022-01-17 SURGICAL SUPPLY — 30 items
ADH SKN CLS APL DERMABOND .7 (GAUZE/BANDAGES/DRESSINGS) ×1
BAG SPEC RTRVL LRG 6X4 10 (ENDOMECHANICALS) ×1
COVER MAYO STAND STRL (DRAPES) ×2 IMPLANT
DERMABOND ADVANCED .7 DNX12 (GAUZE/BANDAGES/DRESSINGS) ×2 IMPLANT
DILATOR CANAL MILEX (MISCELLANEOUS) IMPLANT
DURAPREP 26ML APPLICATOR (WOUND CARE) ×2 IMPLANT
GLOVE BIO SURGEON STRL SZ7.5 (GLOVE) ×2 IMPLANT
GLOVE BIOGEL PI IND STRL 7.0 (GLOVE) ×4 IMPLANT
GLOVE BIOGEL PI IND STRL 7.5 (GLOVE) ×4 IMPLANT
GOWN STRL REUS W/TWL LRG LVL3 (GOWN DISPOSABLE) ×2 IMPLANT
KIT TURNOVER CYSTO (KITS) ×2 IMPLANT
LIGASURE VESSEL 5MM BLUNT TIP (ELECTROSURGICAL) ×2 IMPLANT
NDL INSUFFLATION 14GA 120MM (NEEDLE) ×2 IMPLANT
NEEDLE INSUFFLATION 14GA 120MM (NEEDLE) ×1 IMPLANT
PACK LAPAROSCOPY BASIN (CUSTOM PROCEDURE TRAY) ×2 IMPLANT
PACK TRENDGUARD 450 HYBRID PRO (MISCELLANEOUS) ×2 IMPLANT
POUCH SPECIMEN RETRIEVAL 10MM (ENDOMECHANICALS) IMPLANT
SET SUCTION IRRIG HYDROSURG (IRRIGATION / IRRIGATOR) IMPLANT
SET TUBE SMOKE EVAC HIGH FLOW (TUBING) ×2 IMPLANT
SUT VIC AB 0 CT1 27 (SUTURE) ×1
SUT VIC AB 0 CT1 27XBRD ANTBC (SUTURE) IMPLANT
SUT VIC AB 4-0 PS2 18 (SUTURE) IMPLANT
SUT VICRYL 0 UR6 27IN ABS (SUTURE) ×2 IMPLANT
SYR 50ML LL SCALE MARK (SYRINGE) ×2 IMPLANT
TOWEL OR 17X26 10 PK STRL BLUE (TOWEL DISPOSABLE) ×2 IMPLANT
TRAY FOLEY W/BAG SLVR 14FR LF (SET/KITS/TRAYS/PACK) ×2 IMPLANT
TRENDGUARD 450 HYBRID PRO PACK (MISCELLANEOUS) ×1
TROCAR Z-THREAD FIOS 11X100 BL (TROCAR) ×2 IMPLANT
TROCAR Z-THREAD FIOS 5X100MM (TROCAR) ×2 IMPLANT
WARMER LAPAROSCOPE (MISCELLANEOUS) ×2 IMPLANT

## 2022-01-17 NOTE — Transfer of Care (Signed)
Immediate Anesthesia Transfer of Care Note  Patient: Jacqueline Dawson  Procedure(s) Performed: Procedure(s) (LRB): LAPAROSCOPIC RIGHT OOPHERECTOMY (Right)  Patient Location: PACU  Anesthesia Type: General  Level of Consciousness: awake, alert  and oriented  Airway & Oxygen Therapy: Patient Spontanous Breathing and Patient connected to nasal cannula oxygen  Post-op Assessment: Report given to PACU RN and Post -op Vital signs reviewed and stable  Post vital signs: Reviewed and stable  Complications: No apparent anesthesia complications  Last Vitals:  Vitals Value Taken Time  BP 133/96 01/17/22 1216  Temp    Pulse 92 01/17/22 1220  Resp 13 01/17/22 1220  SpO2 98 % 01/17/22 1220  Vitals shown include unvalidated device data.  Last Pain:  Vitals:   01/17/22 0841  TempSrc: Oral  PainSc: 0-No pain      Patients Stated Pain Goal: 5 (75/88/32 5498)  Complications: No notable events documented.

## 2022-01-17 NOTE — Op Note (Signed)
Preop Diagnosis: RIGHT OVARIAN CYST   Postop Diagnosis: RIGHT OVARIAN CYST   Procedure: LAPAROSCOPIC RIGHT OOPHORECTOMY  Anesthesia: Choice   Anesthesiologist: See flowsheet  Attending: Everett Graff, MD   Assistant:  Crawford Givens, MD  Findings: Rt ovarian cyst  Pathology: Rt ovary with cyst  Fluids: 1000 cc  UOP: 150 cc  EBL: 25 cc  Complications: None  Procedure:  The patient was taken to the operating room after the risks, benefits, alternatives, complications, treatment options, and expected outcomes were discussed with the patient. The patient verbalized understanding, the patient concurred with the proposed plan and consent signed and witnessed. The patient was taken to the Operating Room and identified as Jacqueline Dawson and the procedure verified as Laparoscopic Ovarian Cystectomy possible Oophorectomy.  The patient was placed under general anesthesia per anesthesia staff, the patient was placed in modified dorsal lithotomy position and was prepped, draped, and catheterized in the normal, sterile fashion.  A Time Out was held and the above information confirmed.  The cervix was visualized and an intrauterine manipulator was placed.  A 10 mm umbilical incision was then performed. Veress needle was passed and pneumoperitoneum was established. A 10 mm trocar was advanced into the intraabdominal cavity, the laparoscope was introduced and findings as noted above.  Patient was placed in trendelenburg and marcaine injected in the RLQ and a 5 mm incision was made and 5 mm trocar advanced into the intraabdominal cavity and a 10 mm in the suprapubic area in a similar fashion.   The rt ureter was identified and rt infundibulopelvic ligament was cauterized and incised with the ligasure.  The remainder of the mesosalpinx and utero-ovarian were cauterized and incised as well.  The right ovary was placed in an endopouch and after extending the 49m suprapubic incision was removed  without difficulty and sent to pathology.  The fascia was repaired with 0 vicryl via a running stitch.  The intraabdominal cavity was reinsufflated and good hemostasis at the pedicle was noted.  Irrigation was performed.  The RLQ trocar was removed under direct visualization.  Pneumoperitoneum relieved and umbilical trocar removed as well under direct visualization.  The suprapubic incision was repaired with 3-0 monocryl and the 10 mm umbilical incisions was repaired with 3-0 monocryl via a subcuticular stitch as well and dermabond was applied to all incisions.    Sponge, instrument, lap and needle counts were correct.  The patient tolerated the procedure well and was awaiting extubation and transfer to the recovery room.  An assistant was needed due to the complexity of anatomy.

## 2022-01-17 NOTE — Anesthesia Procedure Notes (Signed)
Procedure Name: Intubation Date/Time: 01/17/2022 10:35 AM  Performed by: Mechele Claude, CRNAPre-anesthesia Checklist: Patient identified, Emergency Drugs available, Suction available and Patient being monitored Patient Re-evaluated:Patient Re-evaluated prior to induction Oxygen Delivery Method: Circle system utilized Preoxygenation: Pre-oxygenation with 100% oxygen Induction Type: IV induction Ventilation: Mask ventilation without difficulty Laryngoscope Size: Mac and 3 Grade View: Grade I Tube type: Oral Tube size: 7.0 mm Number of attempts: 1 Airway Equipment and Method: Stylet and Oral airway Placement Confirmation: ETT inserted through vocal cords under direct vision, positive ETCO2 and breath sounds checked- equal and bilateral Secured at: 21 cm Tube secured with: Tape Dental Injury: Teeth and Oropharynx as per pre-operative assessment

## 2022-01-17 NOTE — Anesthesia Postprocedure Evaluation (Signed)
Anesthesia Post Note  Patient: Jacqueline Dawson  Procedure(s) Performed: LAPAROSCOPIC RIGHT OOPHERECTOMY (Right: Abdomen)     Patient location during evaluation: PACU Anesthesia Type: General Level of consciousness: awake and alert Pain management: pain level controlled Vital Signs Assessment: post-procedure vital signs reviewed and stable Respiratory status: spontaneous breathing, nonlabored ventilation and respiratory function stable Cardiovascular status: blood pressure returned to baseline Postop Assessment: no apparent nausea or vomiting Anesthetic complications: no   No notable events documented.  Last Vitals:  Vitals:   01/17/22 1245 01/17/22 1300  BP: (!) 145/96 (!) 149/101  Pulse: 84 94  Resp: 11 18  Temp:    SpO2: 100% 97%    Last Pain:  Vitals:   01/17/22 1300  TempSrc:   PainSc: [place]

## 2022-01-17 NOTE — Discharge Instructions (Signed)
No ibuprofen, Advil, Aleve, Motrin, ketorolac, meloxicam, naproxen, or other NSAIDS until after 5:52 pm today if needed.  No acetaminophen/Tylenol until after 2:46 pm today if needed.  Oxycodone was given to you today at 12:40pm, your next dose if needed can be taken at 6:40pm    Post Anesthesia Home Care Instructions  Activity: Get plenty of rest for the remainder of the day. A responsible individual must stay with you for 24 hours following the procedure.  For the next 24 hours, DO NOT: -Drive a car -Paediatric nurse -Drink alcoholic beverages -Take any medication unless instructed by your physician -Make any legal decisions or sign important papers.  Meals: Start with liquid foods such as gelatin or soup. Progress to regular foods as tolerated. Avoid greasy, spicy, heavy foods. If nausea and/or vomiting occur, drink only clear liquids until the nausea and/or vomiting subsides. Call your physician if vomiting continues.  Special Instructions/Symptoms: Your throat may feel dry or sore from the anesthesia or the breathing tube placed in your throat during surgery. If this causes discomfort, gargle with warm salt water. The discomfort should disappear within 24 hours.  If you had a scopolamine patch placed behind your ear for the management of post- operative nausea and/or vomiting:  1. The medication in the patch is effective for 72 hours, after which it should be removed.  Wrap patch in a tissue and discard in the trash. Wash hands thoroughly with soap and water. 2. You may remove the patch earlier than 72 hours if you experience unpleasant side effects which may include dry mouth, dizziness or visual disturbances. 3. Avoid touching the patch. Wash your hands with soap and water after contact with the patch.

## 2022-01-17 NOTE — Anesthesia Preprocedure Evaluation (Addendum)
Anesthesia Evaluation  Patient identified by MRN, date of birth, ID band Patient awake    Reviewed: Allergy & Precautions, NPO status , Patient's Chart, lab work & pertinent test results  History of Anesthesia Complications Negative for: history of anesthetic complications  Airway Mallampati: II  TM Distance: >3 FB Neck ROM: Full    Dental no notable dental hx.    Pulmonary Current Smoker and Patient abstained from smoking.   Pulmonary exam normal        Cardiovascular hypertension, Normal cardiovascular exam     Neuro/Psych   Anxiety Depression    negative neurological ROS     GI/Hepatic negative GI ROS, Neg liver ROS,,,  Endo/Other  negative endocrine ROS    Renal/GU negative Renal ROS  negative genitourinary   Musculoskeletal negative musculoskeletal ROS (+)    Abdominal   Peds  Hematology  (+) HIV  Anesthesia Other Findings Day of surgery medications reviewed with patient.  Reproductive/Obstetrics Right ovarian cyst                             Anesthesia Physical Anesthesia Plan  ASA: 2  Anesthesia Plan: General   Post-op Pain Management: Tylenol PO (pre-op)* and Toradol IV (intra-op)*   Induction: Intravenous  PONV Risk Score and Plan: 2 and Treatment may vary due to age or medical condition, Midazolam, Dexamethasone, Ondansetron and Scopolamine patch - Pre-op  Airway Management Planned: Oral ETT  Additional Equipment: None  Intra-op Plan:   Post-operative Plan: Extubation in OR  Informed Consent: I have reviewed the patients History and Physical, chart, labs and discussed the procedure including the risks, benefits and alternatives for the proposed anesthesia with the patient or authorized representative who has indicated his/her understanding and acceptance.     Dental advisory given  Plan Discussed with: CRNA  Anesthesia Plan Comments:        Anesthesia  Quick Evaluation

## 2022-01-17 NOTE — H&P (Signed)
Jacqueline Dawson is an 46 y.o. female. Pt well known to me.  She has had a persistent ovarian cyst and would like it removed.  Pertinent Gynecological History: No cycles s/p ablation  Menstrual History:  No LMP recorded. Patient has had an ablation.    Past Medical History:  Diagnosis Date   Anxiety    Depression    HIV positive ([place])     Past Surgical History:  Procedure Laterality Date   COLONOSCOPY  11/05/2020   LAPAROSCOPIC OVARIAN CYSTECTOMY Left 12/13/2015   Procedure: LAPAROSCOPIC OVARIAN CYSTECTOMY;  Surgeon: Everett Graff, MD;  Location: [facility];  Service: Gynecology;  Laterality: Left;   TUBAL LIGATION     WISDOM TOOTH EXTRACTION      Family History  Problem Relation Age of Onset   Colon polyps Mother    Hypertension Mother    Colon cancer Maternal Aunt    Cancer - Cervical Maternal Aunt    Esophageal cancer Neg Hx    Rectal cancer Neg Hx    Stomach cancer Neg Hx     Social History:  reports that she has been smoking cigarettes. She has a 1.60 pack-year smoking history. She has never used smokeless tobacco. She reports current alcohol use of about 7.0 standard drinks of alcohol per week. She reports that she does not currently use drugs after having used the following drugs: Marijuana.  Allergies:  Allergies  Allergen Reactions   Sulfonamide Derivatives Rash    Medications Prior to Admission  Medication Sig Dispense Refill Last Dose   hydrOXYzine (ATARAX) 25 MG tablet Take 1-2 tablets (25-50 mg total) by mouth 3 (three) times daily as needed. 60 tablet 3 01/12/2022   ketoconazole (NIZORAL) 2 % shampoo USE AS DIRECTED 120 mL 1 Past Month   Multiple Vitamins-Minerals (MULTIVITAMINS THER. W/MINERALS) TABS Take 1 tablet by mouth daily.   01/15/2022   naproxen (NAPROSYN) 500 MG tablet naproxen 500 mg tablet  Take 1 tablet every 12 hours by oral route as needed.   01/16/2022   traZODone (DESYREL) 50 MG tablet TAKE 1/2 TABLET (25 MG TOTAL) BY MOUTH AT BEDTIME AS  NEEDED FOR SLEEP. 30 tablet 3 01/16/2022   triamcinolone cream (KENALOG) 0.1 % Apply 1 Application topically 2 (two) times daily. 80 g 0 01/16/2022   valACYclovir (VALTREX) 500 MG tablet valacyclovir 500 mg tablet  TAKE 1 TABLET BY MOUTH EVERY DAY   01/12/2022   cabotegravir & rilpivirine ER (CABENUVA) 600 & 900 MG/3ML injection Inject 1 kit into the muscle every 2 (two) months. 6 mL 5 More than a month    Review of Systems Denies F/C/N/V/D  Blood pressure (!) 146/104, pulse 81, temperature (!) 97 F (36.1 C), temperature source Oral, resp. rate 15, height 5' (1.524 m), weight 61.7 kg, SpO2 100 %. Physical Exam Lungs CTA  CV RRR Abdomen soft, NT Extremities no calf tenderness  Results for orders placed or performed during the hospital encounter of 01/17/22 (from the past 24 hour(s))  Pregnancy, urine POC     Status: None   Collection Time: 01/17/22  8:42 AM  Result Value Ref Range   Preg Test, Ur NEGATIVE NEGATIVE  CBC     Status: None   Collection Time: 01/17/22  8:59 AM  Result Value Ref Range   WBC 5.6 4.0 - 10.5 K/uL   RBC 4.55 3.87 - 5.11 MIL/uL   Hemoglobin 12.5 12.0 - 15.0 g/dL   HCT 39.7 36.0 - 46.0 %   MCV 87.3  80.0 - 100.0 fL   MCH 27.5 26.0 - 34.0 pg   MCHC 31.5 30.0 - 36.0 g/dL   RDW 12.2 11.5 - 15.5 %   Platelets 228 150 - 400 K/uL   nRBC 0.0 0.0 - 0.2 %  Basic metabolic panel     Status: None   Collection Time: 01/17/22  8:59 AM  Result Value Ref Range   Sodium 135 135 - 145 mmol/L   Potassium 3.8 3.5 - 5.1 mmol/L   Chloride 101 98 - 111 mmol/L   CO2 25 22 - 32 mmol/L   Glucose, Bld 89 70 - 99 mg/dL   BUN 18 6 - 20 mg/dL   Creatinine, Ser 0.89 0.44 - 1.00 mg/dL   Calcium 9.3 8.9 - 10.3 mg/dL   GFR, Estimated >60 >60 mL/min   Anion gap 9 5 - 15    No results found.  Assessment/Plan: [id number] with persistent rt ovarian cyst.  Pt presents for removal and upon discussion in preop area, if cyst is still present she would like ovary removed.  Risks benefits  alternatives reviewed with the patient including but not limited to bleeding infection and injury.  Questions answered and consent signed and witnessed.  Delice Lesch 01/17/2022, 10:17 AM

## 2022-01-20 ENCOUNTER — Encounter (HOSPITAL_BASED_OUTPATIENT_CLINIC_OR_DEPARTMENT_OTHER): Payer: Self-pay | Admitting: Obstetrics and Gynecology

## 2022-01-20 LAB — SURGICAL PATHOLOGY

## 2022-01-27 ENCOUNTER — Other Ambulatory Visit (HOSPITAL_COMMUNITY): Payer: Self-pay

## 2022-02-06 ENCOUNTER — Telehealth: Payer: Self-pay

## 2022-02-06 NOTE — Telephone Encounter (Signed)
[facility] Patient Advocate Encounter  Patient's medication Kern Reap) have been couriered to [facility] from [place] and will be administered on the patient next office visit on 02/14/22.  [name] , [facility] Patient [facility] for Infectious Disease Phone: [phone] Fax:  [phone]

## 2022-02-08 ENCOUNTER — Other Ambulatory Visit: Payer: Self-pay | Admitting: Family

## 2022-02-08 DIAGNOSIS — G4700 Insomnia, unspecified: Secondary | ICD-10-CM

## 2022-02-11 NOTE — Telephone Encounter (Signed)
Appt 02/14/22.

## 2022-02-13 ENCOUNTER — Other Ambulatory Visit: Payer: Self-pay

## 2022-02-13 ENCOUNTER — Encounter: Payer: Self-pay | Admitting: Family

## 2022-02-13 ENCOUNTER — Ambulatory Visit (INDEPENDENT_AMBULATORY_CARE_PROVIDER_SITE_OTHER): Payer: BC Managed Care – PPO | Admitting: Family

## 2022-02-13 VITALS — BP 140/95 | HR 87 | Temp 98.2°F | Resp 16 | Ht 66.0 in | Wt 137.0 lb

## 2022-02-13 DIAGNOSIS — L309 Dermatitis, unspecified: Secondary | ICD-10-CM | POA: Diagnosis not present

## 2022-02-13 DIAGNOSIS — B20 Human immunodeficiency virus [HIV] disease: Secondary | ICD-10-CM | POA: Diagnosis not present

## 2022-02-13 MED ORDER — CABOTEGRAVIR & RILPIVIRINE ER 600 & 900 MG/3ML IM SUER
1.0000 | Freq: Once | INTRAMUSCULAR | Status: AC
  Administered 2022-02-13: 1 via INTRAMUSCULAR

## 2022-02-13 NOTE — Progress Notes (Signed)
Brief Narrative   Patient ID: Jacqueline Dawson, female    DOB: 1975-06-12, 47 y.o.   MRN: [id number]  Ms. Sousa is a 47 y/o AA female diagnosed with HIV disease in March 2004 with risk factor of heterosexual contact. Initial viral load was 39,100 with CD4 count 10. No Genosure was available. No history of opportunistic infection. Entered care at [facility] Stage 3. Previous ART experience with [facility], [facility], [facility], and now [facility].   Subjective:    Chief Complaint  Patient presents with   Follow-up    B20     HPI:  Jacqueline Dawson is a 47 y.o. female with HIV disease last seen on 12/24/21 by Magda Kiel, PharmD, CPP with good adherence and tolerance to q 2 month Cabenuva. Viral load was undetectable. Received injection with no complications. Here today for follow up.  Zella has been doing well since her last office visit with no new concerns/complaints. Eczema is improved and has concerns about he hyperpigmentation of her skin. Continues to use the triamcinolone cream. Tolerating Cabenuva with no adverse side effects. Condoms and STD testing offered. Routine vaccinations and screenings are up to date.   Denies fevers, chills, night sweats, headaches, changes in vision, neck pain/stiffness, nausea, diarrhea, vomiting, lesions or rashes.    Allergies  Allergen Reactions   Sulfonamide Derivatives Rash      Outpatient Medications Prior to Visit  Medication Sig Dispense Refill   cabotegravir & rilpivirine ER (CABENUVA) 600 & 900 MG/3ML injection Inject 1 kit into the muscle every 2 (two) months. 6 mL 5   cyclobenzaprine (FLEXERIL) 5 MG tablet Take 5 mg by mouth at bedtime as needed.     fluticasone (FLONASE) 50 MCG/ACT nasal spray      hydrOXYzine (ATARAX) 25 MG tablet Take 1-2 tablets (25-50 mg total) by mouth 3 (three) times daily as needed. 60 tablet 3   ketoconazole (NIZORAL) 2 % shampoo USE AS DIRECTED 120 mL 1   Multiple Vitamins-Minerals (MULTIVITAMINS THER.  W/MINERALS) TABS Take 1 tablet by mouth daily.     naproxen (NAPROSYN) 500 MG tablet naproxen 500 mg tablet  Take 1 tablet every 12 hours by oral route as needed.     omeprazole (PRILOSEC) 40 MG capsule Take 1 tablet by mouth daily.     traZODone (DESYREL) 50 MG tablet TAKE 1/2 TABLET (25 MG TOTAL) BY MOUTH AT BEDTIME AS NEEDED FOR SLEEP. 30 tablet 3   triamcinolone cream (KENALOG) 0.1 % Apply 1 Application topically 2 (two) times daily. 80 g 0   valACYclovir (VALTREX) 500 MG tablet valacyclovir 500 mg tablet  TAKE 1 TABLET BY MOUTH EVERY DAY     oxyCODONE (ROXICODONE) 5 MG immediate release tablet Take 1 tablet (5 mg total) by mouth every 6 (six) hours as needed for severe pain or breakthrough pain. (Patient not taking: Reported on 02/13/2022) 20 tablet 0   No facility-administered medications prior to visit.     Past Medical History:  Diagnosis Date   Anxiety    Depression    HIV positive ([place])      Past Surgical History:  Procedure Laterality Date   COLONOSCOPY  11/05/2020   LAPAROSCOPIC OVARIAN CYSTECTOMY Left 12/13/2015   Procedure: LAPAROSCOPIC OVARIAN CYSTECTOMY;  Surgeon: Everett Graff, MD;  Location: [facility];  Service: Gynecology;  Laterality: Left;   LAPAROSCOPIC OVARIAN CYSTECTOMY Right 01/17/2022   Procedure: LAPAROSCOPIC RIGHT OOPHERECTOMY;  Surgeon: Everett Graff, MD;  Location: [facility];  Service: Gynecology;  Laterality: Right;   TUBAL LIGATION     WISDOM TOOTH EXTRACTION        Review of Systems  Constitutional:  Negative for appetite change, chills, diaphoresis, fatigue, fever and unexpected weight change.  Eyes:        Negative for acute change in vision  Respiratory:  Negative for chest tightness, shortness of breath and wheezing.   Cardiovascular:  Negative for chest pain.  Gastrointestinal:  Negative for diarrhea, nausea and vomiting.  Genitourinary:  Negative for dysuria, pelvic pain and vaginal discharge.  Musculoskeletal:  Negative  for neck pain and neck stiffness.  Skin:  Negative for rash.  Neurological:  Negative for seizures, syncope, weakness and headaches.  Hematological:  Negative for adenopathy. Does not bruise/bleed easily.  Psychiatric/Behavioral:  Negative for hallucinations.       Objective:    BP (!) 140/95   Pulse 87   Temp 98.2 F (36.8 C) (Oral)   Resp 16   Ht _0  (1.676 m)   Wt 137 lb (62.1 kg)   SpO2 99%   BMI 22.11 kg/m  Nursing note and vital signs reviewed.  Physical Exam Constitutional:      General: She is not in acute distress.    Appearance: She is well-developed.  Eyes:     Conjunctiva/sclera: Conjunctivae normal.  Cardiovascular:     Rate and Rhythm: Normal rate and regular rhythm.     Heart sounds: Normal heart sounds. No murmur heard.    No friction rub. No gallop.  Pulmonary:     Effort: Pulmonary effort is normal. No respiratory distress.     Breath sounds: Normal breath sounds. No wheezing or rales.  Chest:     Chest wall: No tenderness.  Abdominal:     General: Bowel sounds are normal.     Palpations: Abdomen is soft.     Tenderness: There is no abdominal tenderness.  Musculoskeletal:     Cervical back: Neck supple.  Lymphadenopathy:     Cervical: No cervical adenopathy.  Skin:    General: Skin is warm and dry.     Findings: No rash.  Neurological:     Mental Status: She is alert and oriented to person, place, and time.  Psychiatric:        Behavior: Behavior normal.        Thought Content: Thought content normal.        Judgment: Judgment normal.         02/13/2022    9:59 AM 11/15/2021   10:24 AM 02/22/2021    9:23 AM 10/23/2020    2:19 PM 08/16/2020    4:34 PM  Depression screen PHQ 2/9  Decreased Interest 0 0 0 0 0  Down, Depressed, Hopeless 0 0 1 0 0  PHQ - 2 Score 0 0 1 0 0       Assessment & Plan:    Patient Active Problem List   Diagnosis Date Noted   Eczema 11/15/2021   Other fatigue 11/15/2021   Low libido 02/22/2021   Healthcare  maintenance 12/17/2020   Fibroids 08/16/2020   HTN (hypertension) 01/27/2018   Adjustment disorder with anxious mood 01/15/2016   Insomnia 10/09/2010   ECTOPIC PREGNANCY 04/02/2007   Human immunodeficiency virus (HIV) disease ([place]) 11/11/2005   Depression 11/11/2005   ABFND PAP SMEAR HGSIL 11/11/2005     Problem List Items Addressed This Visit       Musculoskeletal and Integument   Eczema - Primary  Adequately controlled with triamcinolone. Refer to dermatology for further evaluation and determine any additional treatment options.       Relevant Orders   Ambulatory referral to Dermatology     Other   Human immunodeficiency virus (HIV) disease ([place])    Lyann continues to have well controlled virus with good adherence and tolerance to [place] as she has been on it for nearly 2 years now. Reviewed previous lab work and discussed plan of care. Continue current dose of Cabenuva with injection provided with no complications. Plan for follow up in 2 months with pharmacy team and NP/MD in 6 months or sooner if needed.         I am having Leda Quail maintain her multivitamins ther. w/minerals, valACYclovir, naproxen, cabotegravir & rilpivirine ER, triamcinolone cream, hydrOXYzine, traZODone, ketoconazole, oxyCODONE, cyclobenzaprine, fluticasone, and omeprazole. We administered cabotegravir & rilpivirine ER.   Meds ordered this encounter  Medications   cabotegravir & rilpivirine ER (CABENUVA) 600 & 900 MG/3ML injection 1 kit     Follow-up: Return in about 6 months (around 08/14/2022).   Terri Piedra, MSN, FNP-C Nurse Practitioner [facility] for Infectious Disease [place] number: [phone]

## 2022-02-13 NOTE — Patient Instructions (Addendum)
Nice to see you.  Continue medication.   Referral to Dermatology. If you have not heard back in 1-2 weeks please let us know.  Plan for follow up in 2 months or sooner if needed with pharmacy and NP/MD in 6 months.  Have a great day and stay safe!

## 2022-02-13 NOTE — Assessment & Plan Note (Signed)
Adequately controlled with triamcinolone. Refer to dermatology for further evaluation and determine any additional treatment options.

## 2022-02-13 NOTE — Assessment & Plan Note (Signed)
Eilidh continues to have well controlled virus with good adherence and tolerance to [place] as she has been on it for nearly 2 years now. Reviewed previous lab work and discussed plan of care. Continue current dose of Cabenuva with injection provided with no complications. Plan for follow up in 2 months with pharmacy team and NP/MD in 6 months or sooner if needed.

## 2022-02-14 ENCOUNTER — Ambulatory Visit: Payer: BC Managed Care – PPO | Admitting: Family

## 2022-04-15 ENCOUNTER — Other Ambulatory Visit: Payer: Self-pay | Admitting: Pharmacist

## 2022-04-15 DIAGNOSIS — B20 Human immunodeficiency virus [HIV] disease: Secondary | ICD-10-CM

## 2022-04-15 MED ORDER — CABOTEGRAVIR & RILPIVIRINE ER 600 & 900 MG/3ML IM SUER: 1.0000 | INTRAMUSCULAR | 5 refills | Status: DC

## 2022-04-18 ENCOUNTER — Telehealth: Payer: Self-pay

## 2022-04-18 NOTE — Telephone Encounter (Signed)
[facility] Patient Advocate Encounter  Patient's medication Kern Reap) have been couriered to [facility] from [place] and will be administered on the patient next office visit on 04/22/22.  [name] , [facility] Patient [facility] for Infectious Disease Phone: [phone] Fax:  [phone]

## 2022-04-22 ENCOUNTER — Ambulatory Visit (INDEPENDENT_AMBULATORY_CARE_PROVIDER_SITE_OTHER): Payer: BC Managed Care – PPO | Admitting: Pharmacist

## 2022-04-22 ENCOUNTER — Other Ambulatory Visit: Payer: Self-pay

## 2022-04-22 DIAGNOSIS — Z23 Encounter for immunization: Secondary | ICD-10-CM

## 2022-04-22 DIAGNOSIS — B20 Human immunodeficiency virus [HIV] disease: Secondary | ICD-10-CM

## 2022-04-22 MED ORDER — CABOTEGRAVIR & RILPIVIRINE ER 600 & 900 MG/3ML IM SUER
1.0000 | Freq: Once | INTRAMUSCULAR | Status: AC
  Administered 2022-04-22: 1 via INTRAMUSCULAR

## 2022-04-22 NOTE — Progress Notes (Signed)
HPI: Jacqueline Dawson is a 47 y.o. female who presents to the [place] clinic for [place] administration.  Patient Active Problem List   Diagnosis Date Noted   Eczema 11/15/2021   Other fatigue 11/15/2021   Low libido 02/22/2021   Healthcare maintenance 12/17/2020   Fibroids 08/16/2020   HTN (hypertension) 01/27/2018   Adjustment disorder with anxious mood 01/15/2016   Insomnia 10/09/2010   ECTOPIC PREGNANCY 04/02/2007   Human immunodeficiency virus (HIV) disease ([place]) 11/11/2005   Depression 11/11/2005   ABFND PAP SMEAR HGSIL 11/11/2005    Patient's Medications  New Prescriptions   No medications on file  Previous Medications   CABOTEGRAVIR & RILPIVIRINE ER (CABENUVA) 600 & 900 MG/3ML INJECTION    Inject 1 kit into the muscle every 2 (two) months.   CYCLOBENZAPRINE (FLEXERIL) 5 MG TABLET    Take 5 mg by mouth at bedtime as needed.   FLUTICASONE (FLONASE) 50 MCG/ACT NASAL SPRAY       HYDROXYZINE (ATARAX) 25 MG TABLET    Take 1-2 tablets (25-50 mg total) by mouth 3 (three) times daily as needed.   KETOCONAZOLE (NIZORAL) 2 % SHAMPOO    USE AS DIRECTED   MULTIPLE VITAMINS-MINERALS (MULTIVITAMINS THER. W/MINERALS) TABS    Take 1 tablet by mouth daily.   NAPROXEN (NAPROSYN) 500 MG TABLET    naproxen 500 mg tablet  Take 1 tablet every 12 hours by oral route as needed.   OMEPRAZOLE (PRILOSEC) 40 MG CAPSULE    Take 1 tablet by mouth daily.   TRAZODONE (DESYREL) 50 MG TABLET    TAKE 1/2 TABLET (25 MG TOTAL) BY MOUTH AT BEDTIME AS NEEDED FOR SLEEP.   TRIAMCINOLONE CREAM (KENALOG) 0.1 %    Apply 1 Application topically 2 (two) times daily.   VALACYCLOVIR (VALTREX) 500 MG TABLET    valacyclovir 500 mg tablet  TAKE 1 TABLET BY MOUTH EVERY DAY  Modified Medications   No medications on file  Discontinued Medications   No medications on file    Allergies: Allergies  Allergen Reactions   Sulfonamide Derivatives Rash    Past Medical History: Past Medical History:  Diagnosis  Date   Anxiety    Depression    HIV positive ([place])     Social History: Social History   Socioeconomic History   Marital status: Divorced    Spouse name: Not on file   Number of children: Not on file   Years of education: Not on file   Highest education level: Not on file  Occupational History   Not on file  Tobacco Use   Smoking status: Every Day    Packs/day: 0.10    Years: 16.00    Total pack years: 1.60    Types: Cigarettes    Last attempt to quit: 01/28/2011    Years since quitting: 11.2   Smokeless tobacco: Never   Tobacco comments:    Reports she is down to 2 cigarettes a day   Vaping Use   Vaping Use: Never used  Substance and Sexual Activity   Alcohol use: Yes    Alcohol/week: 7.0 standard drinks of alcohol    Types: 7 Glasses of wine per week    Comment: glass of wine a day per pt   Drug use: Not Currently    Types: Marijuana    Comment: occ--11/05/20- states has been over a week   Sexual activity: Not Currently    Partners: Male    Birth control/protection: Surgical  Comment: declined condoms  Other Topics Concern   Not on file  Social History Narrative   Not on file   Social Determinants of Health   Financial Resource Strain: Not on file  Food Insecurity: Not on file  Transportation Needs: Not on file  Physical Activity: Not on file  Stress: Not on file  Social Connections: Not on file    Labs: Lab Results  Component Value Date   HIV1RNAQUANT Not Detected 12/24/2021   HIV1RNAQUANT <20 (H) 08/22/2021   HIV1RNAQUANT NOT DETECTED 06/20/2021   CD4TABS 355 (L) 08/22/2021   CD4TABS 365 (L) 12/17/2020   CD4TABS 428 10/23/2020    RPR and STI Lab Results  Component Value Date   LABRPR NON-REACTIVE 06/19/2020   LABRPR NON-REACTIVE 01/16/2020   LABRPR NON-REACTIVE 05/27/2019   LABRPR NON-REACTIVE 07/29/2018   LABRPR NON-REACTIVE 01/13/2018    STI Results GC CT  06/19/2020  4:47 PM Negative  Negative   01/16/2020  4:15 PM Negative   Negative   07/29/2018 12:00 AM Negative  Negative   01/13/2018 12:00 AM Negative  Negative   07/11/2015 12:00 AM Negative  Negative   01/08/2015 12:00 AM Negative  Negative   12/29/2014 12:00 AM Negative  Negative     Hepatitis B Lab Results  Component Value Date   HEPBSAB REACTIVE (A) 06/20/2021   HEPBSAG NO 04/06/2006   Hepatitis C No results found for: "HEPCAB", "HCVRNAPCRQN" Hepatitis A No results found for: "HAV" Lipids: Lab Results  Component Value Date   CHOL 194 12/24/2021   TRIG 147 12/24/2021   HDL 69 12/24/2021   CHOLHDL 2.8 12/24/2021   VLDL 12 07/16/2016   LDLCALC 101 (H) 12/24/2021    TARGET DATE: The 11th   Assessment: Jacqueline Dawson presents today for her maintenance Cabenuva injections. Past injections were tolerated well without issues. Will get an HIV RNA today as this lab was last collected in November, 2023. Offered patient STI testing today and she politely declined. Asked patient if she would be interested in receiving the second dose of the HepA vaccine series (her first dose was given 06/20/21). The patient agreed to receive the HepA vaccine. She wishes to defer the COVID vaccine for now.   Administered cabotegravir '600mg'$ /84m in left upper outer quadrant of the gluteal muscle. Administered rilpivirine 900 mg/360min the right upper outer quadrant of the gluteal muscle. No issues with injections. She will follow up in 2 months for next set of injections.  Plan: - Cabenuva injections administered - Get HIV RNA  - Give second dose of HepA vaccine series  - Next injections scheduled for 06/18/22 with Cassie and 08/18/22 with GrMarya Amsler Call with any issues or questions   SyBilley GoslingPharmD PGY1 Pharmacy Resident 3/12/202411:13 AM

## 2022-04-25 LAB — HIV-1 RNA QUANT-NO REFLEX-BLD
HIV 1 RNA Quant: <20 Copies/mL — ABNORMAL HIGH
HIV-1 RNA Quant, Log: <1.3 Log cps/mL — ABNORMAL HIGH

## 2022-05-30 ENCOUNTER — Other Ambulatory Visit: Payer: Self-pay | Admitting: Family

## 2022-05-30 DIAGNOSIS — G4700 Insomnia, unspecified: Secondary | ICD-10-CM

## 2022-06-11 ENCOUNTER — Telehealth: Payer: Self-pay

## 2022-06-11 ENCOUNTER — Other Ambulatory Visit (HOSPITAL_COMMUNITY): Payer: Self-pay

## 2022-06-11 NOTE — Telephone Encounter (Signed)
[facility] Patient Advocate Encounter   Received notification from [facility] that prior authorization for Jacqueline Dawson is required.   PA submitted on 06/11/22 Key [id number] Status is pending    [facility] Clinic will continue to follow.   [name], CPhT Specialty Pharmacy Patient [facility] for Infectious Disease Phone: [phone] Fax:  [phone]

## 2022-06-12 ENCOUNTER — Telehealth: Payer: Self-pay

## 2022-06-12 NOTE — Telephone Encounter (Signed)
Pharmacy Patient Advocate Encounter- Cabenuva BIV-Pharmacy Benefit:  PA was submitted to [facility] and has been approved through: 06/12/22-06/12/23 Authorization# [id number]  Please send prescription to Specialty Pharmacy: [facility]: [phone] Estimated Copay is: 0.00

## 2022-06-18 ENCOUNTER — Other Ambulatory Visit: Payer: Self-pay

## 2022-06-18 ENCOUNTER — Telehealth: Payer: Self-pay

## 2022-06-18 ENCOUNTER — Ambulatory Visit (INDEPENDENT_AMBULATORY_CARE_PROVIDER_SITE_OTHER): Payer: BC Managed Care – PPO | Admitting: Pharmacist

## 2022-06-18 DIAGNOSIS — B20 Human immunodeficiency virus [HIV] disease: Secondary | ICD-10-CM

## 2022-06-18 DIAGNOSIS — Z23 Encounter for immunization: Secondary | ICD-10-CM

## 2022-06-18 MED ORDER — CABOTEGRAVIR & RILPIVIRINE ER 600 & 900 MG/3ML IM SUER
1.0000 | Freq: Once | INTRAMUSCULAR | Status: AC
  Administered 2022-06-18: 1 via INTRAMUSCULAR

## 2022-06-18 NOTE — Progress Notes (Signed)
HPI: Jacqueline Dawson is a 47 y.o. female who presents to the [facility] pharmacy clinic for [place] administration.  Patient Active Problem List   Diagnosis Date Noted   Eczema 11/15/2021   Other fatigue 11/15/2021   Low libido 02/22/2021   Healthcare maintenance 12/17/2020   Fibroids 08/16/2020   HTN (hypertension) 01/27/2018   Adjustment disorder with anxious mood 01/15/2016   Insomnia 10/09/2010   ECTOPIC PREGNANCY 04/02/2007   Human immunodeficiency virus (HIV) disease (HCC) 11/11/2005   Depression 11/11/2005   ABFND PAP SMEAR HGSIL 11/11/2005    Patient's Medications  New Prescriptions   No medications on file  Previous Medications   CABOTEGRAVIR & RILPIVIRINE ER (CABENUVA) 600 & 900 MG/3ML INJECTION    Inject 1 kit into the muscle every 2 (two) months.   CYCLOBENZAPRINE (FLEXERIL) 5 MG TABLET    Take 5 mg by mouth at bedtime as needed.   FLUTICASONE (FLONASE) 50 MCG/ACT NASAL SPRAY       HYDROXYZINE (ATARAX) 25 MG TABLET    Take 1-2 tablets (25-50 mg total) by mouth 3 (three) times daily as needed.   KETOCONAZOLE (NIZORAL) 2 % SHAMPOO    USE AS DIRECTED   MULTIPLE VITAMINS-MINERALS (MULTIVITAMINS THER. W/MINERALS) TABS    Take 1 tablet by mouth daily.   NAPROXEN (NAPROSYN) 500 MG TABLET    naproxen 500 mg tablet  Take 1 tablet every 12 hours by oral route as needed.   OMEPRAZOLE (PRILOSEC) 40 MG CAPSULE    Take 1 tablet by mouth daily.   TRAZODONE (DESYREL) 50 MG TABLET    TAKE 1/2 TABLET (25 MG TOTAL) BY MOUTH AT BEDTIME AS NEEDED FOR SLEEP.   TRIAMCINOLONE CREAM (KENALOG) 0.1 %    Apply 1 Application topically 2 (two) times daily.   VALACYCLOVIR (VALTREX) 500 MG TABLET    valacyclovir 500 mg tablet  TAKE 1 TABLET BY MOUTH EVERY DAY  Modified Medications   No medications on file  Discontinued Medications   No medications on file    Allergies: Allergies  Allergen Reactions   Sulfonamide Derivatives Rash    Past Medical History: Past Medical History:  Diagnosis  Date   Anxiety    Depression    HIV positive (HCC)     Social History: Social History   Socioeconomic History   Marital status: Divorced    Spouse name: Not on file   Number of children: Not on file   Years of education: Not on file   Highest education level: Not on file  Occupational History   Not on file  Tobacco Use   Smoking status: Every Day    Packs/day: 0.10    Years: 16.00    Additional pack years: 0.00    Total pack years: 1.60    Types: Cigarettes    Last attempt to quit: 01/28/2011    Years since quitting: 11.3   Smokeless tobacco: Never   Tobacco comments:    Reports she is down to 2 cigarettes a day   Vaping Use   Vaping Use: Never used  Substance and Sexual Activity   Alcohol use: Yes    Alcohol/week: 7.0 standard drinks of alcohol    Types: 7 Glasses of wine per week    Comment: glass of wine a day per pt   Drug use: Not Currently    Types: Marijuana    Comment: occ--11/05/20- states has been over a week   Sexual activity: Not Currently    Partners: Male  Birth control/protection: Surgical    Comment: declined condoms  Other Topics Concern   Not on file  Social History Narrative   Not on file   Social Determinants of Health   Financial Resource Strain: Not on file  Food Insecurity: Not on file  Transportation Needs: Not on file  Physical Activity: Not on file  Stress: Not on file  Social Connections: Not on file    Labs: Lab Results  Component Value Date   HIV1RNAQUANT <20 (H) 04/22/2022   HIV1RNAQUANT Not Detected 12/24/2021   HIV1RNAQUANT <20 (H) 08/22/2021   CD4TABS 355 (L) 08/22/2021   CD4TABS 365 (L) 12/17/2020   CD4TABS 428 10/23/2020    RPR and STI Lab Results  Component Value Date   LABRPR NON-REACTIVE 06/19/2020   LABRPR NON-REACTIVE 01/16/2020   LABRPR NON-REACTIVE 05/27/2019   LABRPR NON-REACTIVE 07/29/2018   LABRPR NON-REACTIVE 01/13/2018    STI Results GC CT  06/19/2020  4:47 PM Negative  Negative    01/16/2020  4:15 PM Negative  Negative   07/29/2018 12:00 AM Negative  Negative   01/13/2018 12:00 AM Negative  Negative   07/11/2015 12:00 AM Negative  Negative   01/08/2015 12:00 AM Negative  Negative   12/29/2014 12:00 AM Negative  Negative     Hepatitis B Lab Results  Component Value Date   HEPBSAB REACTIVE (A) 06/20/2021   HEPBSAG NO 04/06/2006   Hepatitis C No results found for: "HEPCAB", "HCVRNAPCRQN" Hepatitis A No results found for: "HAV" Lipids: Lab Results  Component Value Date   CHOL 194 12/24/2021   TRIG 147 12/24/2021   HDL 69 12/24/2021   CHOLHDL 2.8 12/24/2021   VLDL 12 07/16/2016   LDLCALC 101 (H) 12/24/2021    TARGET DATE: The 11th   Assessment: Jacqueline Dawson presents today for her maintenance Cabenuva injections. Past injections were tolerated well without issues and her last HIV RNA was <20. She was offered STI testing today and she politely declined. I discussed receiving the COVID vaccine and patient declined and wishes to not be asked at future visits. Patient accepted a hep A titer to make sure she has adequate protection for hep A after finishing vaccine series.  Administered cabotegravir 600mg /75mL in right upper outer quadrant of the gluteal muscle. Administered rilpivirine 900 mg/80mL in the left upper outer quadrant of the gluteal muscle. No issues with injections. She will follow up in 2 months for next set of injections. Patient requested to have cabotegravir in right side from now on and rilpivirine in left side due to pain from having rilpivirine in the right side  Plan: - Cabenuva injections administered - HIV RNA assay  - Hep A titer - Next injections scheduled for 08/18/22 with Tammy Sours and appointment with Cassie 10/23/2022 at 9:30AM - Call with any issues or questions  Thanks,  Arabella Merles, PharmD. [facility] Acute Care PGY-1  06/18/2022 10:30 AM

## 2022-06-18 NOTE — Telephone Encounter (Signed)
[facility] Patient Advocate Encounter  Patient's medication Renaldo Harrison) have been couriered to [facility] from [facility] and will be administered on the patient next office visit on 06/18/22.  [name] , CPhT Specialty Pharmacy Patient [facility] for Infectious Disease Phone: [phone] Fax:  [phone]

## 2022-06-20 LAB — HIV-1 RNA QUANT-NO REFLEX-BLD
HIV 1 RNA Quant: NOT DETECTED Copies/mL
HIV-1 RNA Quant, Log: NOT DETECTED Log cps/mL

## 2022-06-20 LAB — HEPATITIS A ANTIBODY, TOTAL: Hepatitis A AB,Total: REACTIVE — AB

## 2022-07-23 NOTE — Progress Notes (Signed)
The 10-year ASCVD risk score (Arnett DK, et al., 2019) is: 2.7%   Values used to calculate the score:     Age: 47 years     Sex: Female     Is Non-Hispanic African American: Yes     Diabetic: No     Tobacco smoker: Yes     Systolic Blood Pressure: 140 mmHg     Is BP treated: No     HDL Cholesterol: 69 mg/dL     Total Cholesterol: 194 mg/dL  Sandie Ano, RN

## 2022-07-24 ENCOUNTER — Other Ambulatory Visit: Payer: Self-pay | Admitting: Family

## 2022-07-24 DIAGNOSIS — G4700 Insomnia, unspecified: Secondary | ICD-10-CM

## 2022-07-24 NOTE — Telephone Encounter (Signed)
Okay to refill? 

## 2022-08-08 ENCOUNTER — Telehealth: Payer: Self-pay

## 2022-08-08 NOTE — Telephone Encounter (Signed)
[facility] Patient Advocate Encounter  Patient's medication Jacqueline Dawson) have been couriered to [facility] from [facility] and will be administered on the patient next office visit on 08/18/22.  [name] , CPhT Specialty Pharmacy Patient [facility] for Infectious Disease Phone: [phone] Fax:  [phone]

## 2022-08-18 ENCOUNTER — Other Ambulatory Visit: Payer: Self-pay

## 2022-08-18 ENCOUNTER — Encounter: Payer: Self-pay | Admitting: Family

## 2022-08-18 ENCOUNTER — Ambulatory Visit (INDEPENDENT_AMBULATORY_CARE_PROVIDER_SITE_OTHER): Payer: BC Managed Care – PPO | Admitting: Family

## 2022-08-18 VITALS — BP 156/100 | HR 87 | Temp 98.5°F | Wt 139.8 lb

## 2022-08-18 DIAGNOSIS — Z87898 Personal history of other specified conditions: Secondary | ICD-10-CM

## 2022-08-18 DIAGNOSIS — B20 Human immunodeficiency virus [HIV] disease: Secondary | ICD-10-CM | POA: Diagnosis not present

## 2022-08-18 DIAGNOSIS — F17210 Nicotine dependence, cigarettes, uncomplicated: Secondary | ICD-10-CM | POA: Diagnosis not present

## 2022-08-18 DIAGNOSIS — Z720 Tobacco use: Secondary | ICD-10-CM

## 2022-08-18 DIAGNOSIS — I10 Essential (primary) hypertension: Secondary | ICD-10-CM | POA: Diagnosis not present

## 2022-08-18 DIAGNOSIS — Z0000 Encounter for general adult medical examination without abnormal findings: Secondary | ICD-10-CM

## 2022-08-18 DIAGNOSIS — L309 Dermatitis, unspecified: Secondary | ICD-10-CM | POA: Diagnosis not present

## 2022-08-18 DIAGNOSIS — Z79899 Other long term (current) drug therapy: Secondary | ICD-10-CM

## 2022-08-18 MED ORDER — CABOTEGRAVIR & RILPIVIRINE ER 600 & 900 MG/3ML IM SUER
1.0000 | Freq: Once | INTRAMUSCULAR | Status: AC
  Administered 2022-08-18: 1 via INTRAMUSCULAR

## 2022-08-18 MED ORDER — SCOPOLAMINE 1 MG/3DAYS TD PT72: 1.0000 | MEDICATED_PATCH | TRANSDERMAL | 1 refills | Status: AC

## 2022-08-18 NOTE — Assessment & Plan Note (Signed)
Nuha continues to have well controlled virus with good adherence and tolerance to Guinea. Reviewed previous lab work and discussed plan of care.  Check lab work. Cabenuva provided with no adverse side effects. Plan for follow up in 2 months with Pharmacy provider and follow up with me in 6 months or sooner if needed.

## 2022-08-18 NOTE — Patient Instructions (Addendum)
Nice to see you. ? ?We will check your lab work today. ? ?Continue to take your medication daily as prescribed. ? ?Refills have been sent to the pharmacy. ? ?Plan for follow up in 6 months or sooner if needed with lab work on the same day. ? ?Have a great day and stay safe! ? ?

## 2022-08-18 NOTE — Assessment & Plan Note (Signed)
Jacqueline Dawson continues to have exacerbations of her Eczema and would like referral to Dermatology for further evaluation and treatment.

## 2022-08-18 NOTE — Progress Notes (Signed)
Brief Narrative   Patient ID: Jacqueline Dawson, female    DOB: [birth year], 47 y.o.   MRN: [id number]  Ms. Bohach is a 47 y/o AA female diagnosed with HIV disease in March 2004 with risk factor of heterosexual contact. Initial viral load was 39,100 with CD4 count 10. No Genosure was available. No history of opportunistic infection. Entered care at [facility] 3. Previous ART experience with Complara, Stribild, Triumeq, and now Guinea.   Subjective:    Chief Complaint  Patient presents with   HIV Positive/AIDS    HPI:  Jacqueline Dawson is a 47 y.o. female with HIV disease last seen on 06/18/22 by Aggie Cosier PharmD, CPP with good adherence and tolerance to Cabenuva. Viral load was undetectable. Here today for follow up and next injection.   Zoe has been doing well since her last office visit with good tolerance Cabenuva. Continuing to work and getting ready to go on a cruise. Has motion sickness and is requesting assistance with medication. Liking the flexibility that Cabenuva provides. Condoms and STD testing offered. Routine vaccinations, dental care and screenings are up to date. Continues to use tobacco daily.   Denies fevers, chills, night sweats, headaches, changes in vision, neck pain/stiffness, nausea, diarrhea, vomiting, lesions or rashes.  Allergies  Allergen Reactions   Sulfonamide Derivatives Rash      Outpatient Medications Prior to Visit  Medication Sig Dispense Refill   cabotegravir & rilpivirine ER (CABENUVA) 600 & 900 MG/3ML injection Inject 1 kit into the muscle every 2 (two) months. 6 mL 5   cyclobenzaprine (FLEXERIL) 5 MG tablet Take 5 mg by mouth at bedtime as needed.     fluticasone (FLONASE) 50 MCG/ACT nasal spray      hydrOXYzine (ATARAX) 25 MG tablet Take 1-2 tablets (25-50 mg total) by mouth 3 (three) times daily as needed. 60 tablet 3   ketoconazole (NIZORAL) 2 % shampoo USE AS DIRECTED 120 mL 1   Multiple Vitamins-Minerals (MULTIVITAMINS THER.  W/MINERALS) TABS Take 1 tablet by mouth daily.     naproxen (NAPROSYN) 500 MG tablet naproxen 500 mg tablet  Take 1 tablet every 12 hours by oral route as needed.     omeprazole (PRILOSEC) 40 MG capsule Take 1 tablet by mouth daily.     traZODone (DESYREL) 50 MG tablet TAKE 1/2 TABLET (25 MG TOTAL) BY MOUTH AT BEDTIME AS NEEDED FOR SLEEP. 45 tablet 3   triamcinolone cream (KENALOG) 0.1 % Apply 1 Application topically 2 (two) times daily. 80 g 0   valACYclovir (VALTREX) 500 MG tablet valacyclovir 500 mg tablet  TAKE 1 TABLET BY MOUTH EVERY DAY     No facility-administered medications prior to visit.     Past Medical History:  Diagnosis Date   Anxiety    Depression    HIV positive (HCC)      Past Surgical History:  Procedure Laterality Date   COLONOSCOPY  11/05/2020   LAPAROSCOPIC OVARIAN CYSTECTOMY Left 12/13/2015   Procedure: LAPAROSCOPIC OVARIAN CYSTECTOMY;  Surgeon: Osborn Coho, MD;  Location: [facility];  Service: Gynecology;  Laterality: Left;   LAPAROSCOPIC OVARIAN CYSTECTOMY Right 01/17/2022   Procedure: LAPAROSCOPIC RIGHT OOPHERECTOMY;  Surgeon: Osborn Coho, MD;  Location[facility];  Service: Gynecology;  Laterality: Right;   TUBAL LIGATION     WISDOM TOOTH EXTRACTION        Review of Systems  Constitutional:  Negative for appetite change, chills, diaphoresis, fatigue, fever and unexpected weight change.  Eyes:  Negative for acute change in vision  Respiratory:  Negative for chest tightness, shortness of breath and wheezing.   Cardiovascular:  Negative for chest pain.  Gastrointestinal:  Negative for diarrhea, nausea and vomiting.  Genitourinary:  Negative for dysuria, pelvic pain and vaginal discharge.  Musculoskeletal:  Negative for neck pain and neck stiffness.  Skin:  Negative for rash.  Neurological:  Negative for seizures, syncope, weakness and headaches.  Hematological:  Negative for adenopathy. Does not bruise/bleed easily.   Psychiatric/Behavioral:  Negative for hallucinations.       Objective:    BP (!) 156/100   Pulse 87   Temp 98.5 F (36.9 C) (Oral)   Wt 139 lb 12.8 oz (63.4 kg)   BMI 22.56 kg/m  Nursing note and vital signs reviewed.  Physical Exam Constitutional:      General: She is not in acute distress.    Appearance: She is well-developed.  Eyes:     Conjunctiva/sclera: Conjunctivae normal.  Cardiovascular:     Rate and Rhythm: Normal rate and regular rhythm.     Heart sounds: Normal heart sounds. No murmur heard.    No friction rub. No gallop.  Pulmonary:     Effort: Pulmonary effort is normal. No respiratory distress.     Breath sounds: Normal breath sounds. No wheezing or rales.  Chest:     Chest wall: No tenderness.  Abdominal:     General: Bowel sounds are normal.     Palpations: Abdomen is soft.     Tenderness: There is no abdominal tenderness.  Musculoskeletal:     Cervical back: Neck supple.  Lymphadenopathy:     Cervical: No cervical adenopathy.  Skin:    General: Skin is warm and dry.     Findings: No rash.  Neurological:     Mental Status: She is alert and oriented to person, place, and time.  Psychiatric:        Behavior: Behavior normal.        Thought Content: Thought content normal.        Judgment: Judgment normal.         02/13/2022    9:59 AM 11/15/2021   10:24 AM 02/22/2021    9:23 AM 10/23/2020    2:19 PM 08/16/2020    4:34 PM  Depression screen PHQ 2/9  Decreased Interest 0 0 0 0 0  Down, Depressed, Hopeless 0 0 1 0 0  PHQ - 2 Score 0 0 1 0 0       Assessment & Plan:    Patient Active Problem List   Diagnosis Date Noted   History of motion sickness 08/18/2022   Eczema 11/15/2021   Other fatigue 11/15/2021   Low libido 02/22/2021   Healthcare maintenance 12/17/2020   Fibroids 08/16/2020   HTN (hypertension) 01/27/2018   Adjustment disorder with anxious mood 01/15/2016   Insomnia 10/09/2010   Tobacco use 10/09/2010   ECTOPIC PREGNANCY  04/02/2007   Human immunodeficiency virus (HIV) disease (HCC) 11/11/2005   Depression 11/11/2005   ABFND PAP SMEAR HGSIL 11/11/2005     Problem List Items Addressed This Visit       Cardiovascular and Mediastinum   HTN (hypertension)    Blood pressure elevated today. No ophthalmologic or neurological signs/symptoms. She will continue to monitor and at next office visit remains elevated will start medication.         Musculoskeletal and Integument   Eczema    Lucienne continues to have exacerbations of her Eczema  and would like referral to Dermatology for further evaluation and treatment.       Relevant Orders   Ambulatory referral to Dermatology     Other   Human immunodeficiency virus (HIV) disease (HCC) - Primary    Korrie continues to have well controlled virus with good adherence and tolerance to Guinea. Reviewed previous lab work and discussed plan of care.  Check lab work. Cabenuva provided with no adverse side effects. Plan for follow up in 2 months with Pharmacy provider and follow up with me in 6 months or sooner if needed.       Relevant Orders   COMPLETE METABOLIC PANEL WITH GFR   HIV-1 RNA quant-no reflex-bld   T-helper cells (CD4) count (not at [facility])   Tobacco use    Working on cutting back. Discussed importance of tobacco cessation to reduce risk of cardiovascular, respiratory, renal and malignant disease in the future. In the contemplation stage and slowly working towards quitting.       Healthcare maintenance    Discussed importance of safe sexual practice and condom use. Condoms and STD testing offered.  Vaccinations and screenings are up to date.       History of motion sickness    Plans for flying and cruise. Discussed options for prevention and treatment. Start scopolamine for travel.        Other Visit Diagnoses     Pharmacologic therapy       Relevant Orders   Lipid Profile        I am having Mickeal Skinner start on scopolamine. I am also  having her maintain her multivitamins ther. w/minerals, valACYclovir, naproxen, triamcinolone cream, hydrOXYzine, ketoconazole, cyclobenzaprine, fluticasone, omeprazole, cabotegravir & rilpivirine ER, and traZODone. We administered cabotegravir & rilpivirine ER.   Meds ordered this encounter  Medications   scopolamine (TRANSDERM-SCOP) 1 MG/3DAYS    Sig: Place 1 patch (1.5 mg total) onto the skin every 3 (three) days.    Dispense:  4 patch    Refill:  1    Order Specific Question:   Supervising Provider    Answer:   Drue Second, CYNTHIA [[id number]]   cabotegravir & rilpivirine ER (CABENUVA) 600 & 900 MG/3ML injection 1 kit     Follow-up: Return in about 2 months (around 10/19/2022), or if symptoms worsen or fail to improve.   Marcos Eke, MSN, FNP-C Nurse Practitioner [facility] for Infectious Disease [facility] Main number: [phone]

## 2022-08-18 NOTE — Assessment & Plan Note (Addendum)
Blood pressure elevated today. No ophthalmologic or neurological signs/symptoms. She will continue to monitor and at next office visit remains elevated will start medication.

## 2022-08-18 NOTE — Assessment & Plan Note (Signed)
Discussed importance of safe sexual practice and condom use. Condoms and STD testing offered.  Vaccinations and screenings are up to date.

## 2022-08-18 NOTE — Assessment & Plan Note (Signed)
Plans for flying and cruise. Discussed options for prevention and treatment. Start scopolamine for travel.

## 2022-08-18 NOTE — Assessment & Plan Note (Signed)
Working on cutting back. Discussed importance of tobacco cessation to reduce risk of cardiovascular, respiratory, renal and malignant disease in the future. In the contemplation stage and slowly working towards quitting.

## 2022-08-19 LAB — COMPLETE METABOLIC PANEL WITH GFR
AST: 15 U/L (ref 10–35)
Albumin: 4.6 g/dL (ref 3.6–5.1)
Alkaline phosphatase (APISO): 63 U/L (ref 31–125)
BUN: 11 mg/dL (ref 7–25)
Calcium: 10.2 mg/dL (ref 8.6–10.2)
Chloride: 105 mmol/L (ref 98–110)
Globulin: 2.9 g/dL (calc) (ref 1.9–3.7)
Sodium: 140 mmol/L (ref 135–146)
Total Bilirubin: 0.3 mg/dL (ref 0.2–1.2)
Total Protein: 7.5 g/dL (ref 6.1–8.1)

## 2022-08-19 LAB — T-HELPER CELLS (CD4) COUNT (NOT AT ARMC)
Absolute CD4: 584 cells/uL (ref 490–1740)
CD4 T Helper %: 27 % — ABNORMAL LOW (ref 30–61)
Total lymphocyte count: 2147 cells/uL (ref 850–3900)

## 2022-08-19 LAB — LIPID PANEL
LDL Cholesterol (Calc): 123 mg/dL (calc) — ABNORMAL HIGH
Non-HDL Cholesterol (Calc): 140 mg/dL (calc) — ABNORMAL HIGH (ref <130)
Triglycerides: 77 mg/dL (ref <150)

## 2022-08-21 LAB — COMPLETE METABOLIC PANEL WITH GFR
AG Ratio: 1.6 (calc) (ref 1.0–2.5)
ALT: 12 U/L (ref 6–29)
CO2: 29 mmol/L (ref 20–32)
Creat: 0.82 mg/dL (ref 0.50–0.99)
Glucose, Bld: 89 mg/dL (ref 65–99)
Potassium: 4 mmol/L (ref 3.5–5.3)
eGFR: 89 mL/min/{1.73_m2} (ref >=60)

## 2022-08-21 LAB — LIPID PANEL
Cholesterol: 213 mg/dL — ABNORMAL HIGH (ref <200)
HDL: 73 mg/dL (ref >=50)
Total CHOL/HDL Ratio: 2.9 (calc) (ref <5.0)

## 2022-08-21 LAB — HIV-1 RNA QUANT-NO REFLEX-BLD
HIV 1 RNA Quant: NOT DETECTED Copies/mL
HIV-1 RNA Quant, Log: NOT DETECTED Log cps/mL

## 2022-10-16 ENCOUNTER — Telehealth: Payer: Self-pay

## 2022-10-16 NOTE — Telephone Encounter (Signed)
[facility] Patient Advocate Encounter  Patient's medication Jacqueline Dawson) have been couriered to [facility] from [facility] and will be administered on the patient next office visit on 10/23/22.  [name] , CPhT Specialty Pharmacy Patient [facility] for Infectious Disease Phone: [phone] Fax:  [phone]

## 2022-10-23 ENCOUNTER — Encounter: Payer: BC Managed Care – PPO | Admitting: Pharmacist

## 2022-10-28 ENCOUNTER — Ambulatory Visit (INDEPENDENT_AMBULATORY_CARE_PROVIDER_SITE_OTHER): Payer: BC Managed Care – PPO | Admitting: Pharmacist

## 2022-10-28 ENCOUNTER — Other Ambulatory Visit: Payer: Self-pay

## 2022-10-28 DIAGNOSIS — B20 Human immunodeficiency virus [HIV] disease: Secondary | ICD-10-CM | POA: Diagnosis not present

## 2022-10-28 MED ORDER — CABOTEGRAVIR & RILPIVIRINE ER 600 & 900 MG/3ML IM SUER
1.0000 | Freq: Once | INTRAMUSCULAR | Status: AC
  Administered 2022-10-28: 1 via INTRAMUSCULAR

## 2022-10-28 NOTE — Progress Notes (Signed)
10/28/2022  HPI: Jacqueline Dawson is a 47 y.o. female who presents to the [facility] pharmacy clinic for HIV follow-up.  Patient Active Problem List   Diagnosis Date Noted   History of motion sickness 08/18/2022   Eczema 11/15/2021   Other fatigue 11/15/2021   Low libido 02/22/2021   Healthcare maintenance 12/17/2020   Fibroids 08/16/2020   HTN (hypertension) 01/27/2018   Adjustment disorder with anxious mood 01/15/2016   Insomnia 10/09/2010   Tobacco use 10/09/2010   ECTOPIC PREGNANCY 04/02/2007   Human immunodeficiency virus (HIV) disease (HCC) 11/11/2005   Depression 11/11/2005   ABFND PAP SMEAR HGSIL 11/11/2005    Patient's Medications  New Prescriptions   No medications on file  Previous Medications   CABOTEGRAVIR & RILPIVIRINE ER (CABENUVA) 600 & 900 MG/3ML INJECTION    Inject 1 kit into the muscle every 2 (two) months.   CYCLOBENZAPRINE (FLEXERIL) 5 MG TABLET    Take 5 mg by mouth at bedtime as needed.   FLUTICASONE (FLONASE) 50 MCG/ACT NASAL SPRAY       HYDROXYZINE (ATARAX) 25 MG TABLET    Take 1-2 tablets (25-50 mg total) by mouth 3 (three) times daily as needed.   KETOCONAZOLE (NIZORAL) 2 % SHAMPOO    USE AS DIRECTED   MULTIPLE VITAMINS-MINERALS (MULTIVITAMINS THER. W/MINERALS) TABS    Take 1 tablet by mouth daily.   NAPROXEN (NAPROSYN) 500 MG TABLET    naproxen 500 mg tablet  Take 1 tablet every 12 hours by oral route as needed.   OMEPRAZOLE (PRILOSEC) 40 MG CAPSULE    Take 1 tablet by mouth daily.   SCOPOLAMINE (TRANSDERM-SCOP) 1 MG/3DAYS    Place 1 patch (1.5 mg total) onto the skin every 3 (three) days.   TRAZODONE (DESYREL) 50 MG TABLET    TAKE 1/2 TABLET (25 MG TOTAL) BY MOUTH AT BEDTIME AS NEEDED FOR SLEEP.   TRIAMCINOLONE CREAM (KENALOG) 0.1 %    Apply 1 Application topically 2 (two) times daily.   VALACYCLOVIR (VALTREX) 500 MG TABLET    valacyclovir 500 mg tablet  TAKE 1 TABLET BY MOUTH EVERY DAY  Modified Medications   No medications on file  Discontinued  Medications   No medications on file    Labs: Lab Results  Component Value Date   HIV1RNAQUANT Not Detected 08/18/2022   HIV1RNAQUANT Not Detected 06/18/2022   HIV1RNAQUANT <20 (H) 04/22/2022   CD4TABS 355 (L) 08/22/2021   CD4TABS 365 (L) 12/17/2020   CD4TABS 428 10/23/2020    RPR and STI Lab Results  Component Value Date   LABRPR NON-REACTIVE 06/19/2020   LABRPR NON-REACTIVE 01/16/2020   LABRPR NON-REACTIVE 05/27/2019   LABRPR NON-REACTIVE 07/29/2018   LABRPR NON-REACTIVE 01/13/2018    STI Results GC CT  06/19/2020  4:47 PM Negative  Negative   01/16/2020  4:15 PM Negative  Negative   07/29/2018 12:00 AM Negative  Negative   01/13/2018 12:00 AM Negative  Negative   07/11/2015 12:00 AM Negative  Negative   01/08/2015 12:00 AM Negative  Negative   12/29/2014 12:00 AM Negative  Negative     Hepatitis B Lab Results  Component Value Date   HEPBSAB REACTIVE (A) 06/20/2021   HEPBSAG NO 04/06/2006   Hepatitis C No results found for: "HEPCAB", "HCVRNAPCRQN" Hepatitis A Lab Results  Component Value Date   HAV REACTIVE (A) 06/18/2022   Lipids: Lab Results  Component Value Date   CHOL 213 (H) 08/18/2022   TRIG 77 08/18/2022   HDL 73  08/18/2022   CHOLHDL 2.9 08/18/2022   VLDL 12 07/16/2016   LDLCALC 123 (H) 08/18/2022    Current HIV Regimen: Kelli Churn, target date 11th  Assessment: Dazhane presents today for her maintenance Cabenuva injections. Past injections were tolerated well without issues and her last HIV RNA was <20, CD4 was 584 08/2022. Mrs. Crichton declines STI testing today. She endorses no new signs or symptoms, and no new exposures. Patient has previously declined COVID vaccine. Recommended Shingrix via patient's preferred pharmacy, as we do not have it in office. She does endorse a history of childhood chicken pox. Jessenya expressed interested in this, and reported she will get via her pharmacy.   Administered cabotegravir 600mg /35mL in right upper outer  quadrant of the gluteal muscle. Administered rilpivirine 900 mg/46mL in the left upper outer quadrant of the gluteal muscle. No issues with injections. She will follow up in 2 months for next set of injections. Patient previously requested to have cabotegravir in right side and rilpivirine in left side due to pain from having rilpivirine in the right side.  Plan: - Follow up with Jeanine Luz, FNP on 12/15/22 (unavailable in target date window in January for 6 month follow up) - Follow up with Aggie Cosier, PharmD on 02/18/22  Lora Paula, PharmD PGY-2 Infectious Diseases Pharmacy Resident 10/28/2022 9:26 AM

## 2022-10-29 ENCOUNTER — Other Ambulatory Visit: Payer: Self-pay | Admitting: Family

## 2022-10-31 ENCOUNTER — Other Ambulatory Visit (HOSPITAL_COMMUNITY): Payer: Self-pay

## 2022-12-03 ENCOUNTER — Other Ambulatory Visit: Payer: Self-pay | Admitting: Family

## 2022-12-03 NOTE — Telephone Encounter (Signed)
Patient called regarding refill. Also asked if we could send in 90 day supply?

## 2022-12-03 NOTE — Telephone Encounter (Signed)
Okay to refill? 

## 2022-12-11 ENCOUNTER — Other Ambulatory Visit: Payer: Self-pay | Admitting: Internal Medicine

## 2022-12-11 ENCOUNTER — Telehealth: Payer: Self-pay

## 2022-12-11 NOTE — Telephone Encounter (Signed)
[facility] Patient Advocate Encounter  Patient's medications CABENUVA have been couriered to [facility] from [facility] and will be administered at the patients appointment on 12/15/22.  Kae Heller, CPhT Specialty Pharmacy Patient [facility] for Infectious Disease Phone: [phone] Fax:  [phone]

## 2022-12-12 NOTE — Telephone Encounter (Signed)
Okay to refill? 

## 2022-12-15 ENCOUNTER — Other Ambulatory Visit: Payer: Self-pay

## 2022-12-15 ENCOUNTER — Ambulatory Visit (INDEPENDENT_AMBULATORY_CARE_PROVIDER_SITE_OTHER): Payer: BC Managed Care – PPO | Admitting: Family

## 2022-12-15 ENCOUNTER — Encounter: Payer: Self-pay | Admitting: Family

## 2022-12-15 VITALS — BP 144/99 | HR 88 | Resp 16 | Ht 66.0 in | Wt 147.3 lb

## 2022-12-15 DIAGNOSIS — Z0000 Encounter for general adult medical examination without abnormal findings: Secondary | ICD-10-CM | POA: Diagnosis not present

## 2022-12-15 DIAGNOSIS — Z720 Tobacco use: Secondary | ICD-10-CM | POA: Diagnosis not present

## 2022-12-15 DIAGNOSIS — B20 Human immunodeficiency virus [HIV] disease: Secondary | ICD-10-CM | POA: Diagnosis not present

## 2022-12-15 MED ORDER — CABOTEGRAVIR & RILPIVIRINE ER 600 & 900 MG/3ML IM SUER
1.0000 | Freq: Once | INTRAMUSCULAR | Status: AC
  Administered 2022-12-15: 1 via INTRAMUSCULAR

## 2022-12-15 NOTE — Assessment & Plan Note (Signed)
Jacqueline Dawson continues to smoke tobacco at about 2 cigarettes per day. Counseled on importance of tobacco cessation to reduce risk of complications or development of disease in the future. Continues to slowly work towards quitting.

## 2022-12-15 NOTE — Progress Notes (Signed)
Brief Narrative   Patient ID: Jacqueline Dawson, female    DOB: 06-06-1975, 47 y.o.   MRN: [id number]  Jacqueline Dawson is a 47 y/o AA female diagnosed with HIV disease in March 2004 with risk factor of heterosexual contact. Initial viral load was 39,100 with CD4 count 10. No Genosure was available. No history of opportunistic infection. Entered care at [facility]. Previous ART experience with [facility], [facility], [facility], and now [place].   Subjective:    Chief Complaint  Patient presents with   HIV Positive/AIDS    HPI:  Jacqueline Dawson is a 47 y.o. female with HIV disease last seen on 10/28/2022 by Margarite Gouge, PharmD, CPP with well-controlled virus and good adherence and tolerance to Cabenuva.  Last lab work completed on 08/18/2022 with undetectable viral load and CD4 count 584.  Kidney function, liver function, and electrolytes within normal ranges.  Lipid profile with triglycerides 77, LDL 123, and HDL 73.  ASCVD risk was low at 1.4%. Here today for follow up.  Jacqueline Dawson has been doing well since her last office visit and continues to receive Cabenuva every 2 months with no adverse side effects.  Recently returned from vacation previous.  Working with gynecology for increased levels of hot flashes/vasomotor symptoms.  Condoms and STD testing offered.  Vaccinations reviewed and up-to-date as she has received her flu shot.  Routine dental care up-to-date.  Breast cancer, colon cancer, and cervical cancer screenings up-to-date.  Denies fevers, chills, night sweats, headaches, changes in vision, neck pain/stiffness, nausea, diarrhea, vomiting, lesions or rashes.  Lab Results  Component Value Date   CD4TCELL 27 (L) 08/18/2022   CD4TABS 355 (L) 08/22/2021   Lab Results  Component Value Date   HIV1RNAQUANT Not Detected 08/18/2022     Allergies  Allergen Reactions   Sulfonamide Derivatives Rash      Outpatient Medications Prior to Visit  Medication Sig Dispense Refill    cabotegravir & rilpivirine ER (CABENUVA) 600 & 900 MG/3ML injection Inject 1 kit into the muscle every 2 (two) months. 6 mL 5   cyclobenzaprine (FLEXERIL) 5 MG tablet Take 5 mg by mouth at bedtime as needed.     fluticasone (FLONASE) 50 MCG/ACT nasal spray      hydrOXYzine (ATARAX) 25 MG tablet TAKE 1-2 TABLETS (25-50 MG TOTAL) BY MOUTH 3 (THREE) TIMES DAILY AS NEEDED. 90 tablet 2   ketoconazole (NIZORAL) 2 % shampoo USE AS DIRECTED 120 mL 1   Multiple Vitamins-Minerals (MULTIVITAMINS THER. W/MINERALS) TABS Take 1 tablet by mouth daily.     naproxen (NAPROSYN) 500 MG tablet naproxen 500 mg tablet  Take 1 tablet every 12 hours by oral route as needed.     omeprazole (PRILOSEC) 40 MG capsule Take 1 tablet by mouth daily.     scopolamine (TRANSDERM-SCOP) 1 MG/3DAYS Place 1 patch (1.5 mg total) onto the skin every 3 (three) days. 4 patch 1   traZODone (DESYREL) 50 MG tablet TAKE 1/2 TABLET (25 MG TOTAL) BY MOUTH AT BEDTIME AS NEEDED FOR SLEEP. 45 tablet 3   triamcinolone cream (KENALOG) 0.1 % Apply 1 Application topically 2 (two) times daily. 80 g 0   valACYclovir (VALTREX) 500 MG tablet valacyclovir 500 mg tablet  TAKE 1 TABLET BY MOUTH EVERY DAY     No facility-administered medications prior to visit.     Past Medical History:  Diagnosis Date   Anxiety    Depression    HIV positive (HCC)  Past Surgical History:  Procedure Laterality Date   COLONOSCOPY  11/05/2020   LAPAROSCOPIC OVARIAN CYSTECTOMY Left 12/13/2015   Procedure: LAPAROSCOPIC OVARIAN CYSTECTOMY;  Surgeon: Osborn Coho, MD;  Location: [facility];  Service: Gynecology;  Laterality: Left;   LAPAROSCOPIC OVARIAN CYSTECTOMY Right 01/17/2022   Procedure: LAPAROSCOPIC RIGHT OOPHERECTOMY;  Surgeon: Osborn Coho, MD;  Location: [facility];  Service: Gynecology;  Laterality: Right;   TUBAL LIGATION     WISDOM TOOTH EXTRACTION        Review of Systems  Constitutional:  Negative for appetite change, chills,  diaphoresis, fatigue, fever and unexpected weight change.  Eyes:        Negative for acute change in vision  Respiratory:  Negative for chest tightness, shortness of breath and wheezing.   Cardiovascular:  Negative for chest pain.  Gastrointestinal:  Negative for diarrhea, nausea and vomiting.  Genitourinary:  Negative for dysuria, pelvic pain and vaginal discharge.  Musculoskeletal:  Negative for neck pain and neck stiffness.  Skin:  Negative for rash.  Neurological:  Negative for seizures, syncope, weakness and headaches.  Hematological:  Negative for adenopathy. Does not bruise/bleed easily.  Psychiatric/Behavioral:  Negative for hallucinations.       Objective:    BP (!) 144/99   Pulse 88   Resp 16   Ht 5\' 6"  (1.676 m)   Wt 147 lb 4.8 oz (66.8 kg)   LMP  (LMP Unknown)   BMI 23.77 kg/m  Nursing note and vital signs reviewed.  Physical Exam Constitutional:      General: She is not in acute distress.    Appearance: She is well-developed.  Eyes:     Conjunctiva/sclera: Conjunctivae normal.  Cardiovascular:     Rate and Rhythm: Normal rate and regular rhythm.     Heart sounds: Normal heart sounds. No murmur heard.    No friction rub. No gallop.  Pulmonary:     Effort: Pulmonary effort is normal. No respiratory distress.     Breath sounds: Normal breath sounds. No wheezing or rales.  Chest:     Chest wall: No tenderness.  Abdominal:     General: Bowel sounds are normal.     Palpations: Abdomen is soft.     Tenderness: There is no abdominal tenderness.  Musculoskeletal:     Cervical back: Neck supple.  Lymphadenopathy:     Cervical: No cervical adenopathy.  Skin:    General: Skin is warm and dry.     Findings: No rash.  Neurological:     Mental Status: She is alert and oriented to person, place, and time.  Psychiatric:        Behavior: Behavior normal.        Thought Content: Thought content normal.        Judgment: Judgment normal.         12/15/2022     8:59 AM 02/13/2022    9:59 AM 11/15/2021   10:24 AM 02/22/2021    9:23 AM 10/23/2020    2:19 PM  Depression screen PHQ 2/9  Decreased Interest 0 0 0 0 0  Down, Depressed, Hopeless 0 0 0 1 0  PHQ - 2 Score 0 0 0 1 0       Assessment & Plan:    Patient Active Problem List   Diagnosis Date Noted   History of motion sickness 08/18/2022   Eczema 11/15/2021   Other fatigue 11/15/2021   Low libido 02/22/2021   Healthcare maintenance 12/17/2020  Fibroids 08/16/2020   HTN (hypertension) 01/27/2018   Adjustment disorder with anxious mood 01/15/2016   Insomnia 10/09/2010   Tobacco use 10/09/2010   ECTOPIC PREGNANCY 04/02/2007   Human immunodeficiency virus (HIV) disease (HCC) 11/11/2005   Depression 11/11/2005   ABFND PAP SMEAR HGSIL 11/11/2005     Problem List Items Addressed This Visit       Other   Human immunodeficiency virus (HIV) disease (HCC) - Primary    Ms. Bamford continues to have well controlled virus with good adherence and tolerance to [place] .  Reviewed lab work and discussed plan of care, U equals U, and family planning. Check lab work. Continue current dose of Cabenuva . Plan for follow up in  6 months or sooner if needed with lab work on the same day and with pharmacy provider every 2 months in between.        Relevant Orders   COMPLETE METABOLIC PANEL WITH GFR   HIV-1 RNA quant-no reflex-bld   T-helper cell (CD4)- ([facility] clinic only)   Tobacco use    Ms. Birchard continues to smoke tobacco at about 2 cigarettes per day. Counseled on importance of tobacco cessation to reduce risk of complications or development of disease in the future. Continues to slowly work towards quitting.       Healthcare maintenance    Discussed importance of safe sexual practice and condom use. Condoms and STD testing offered.  Colon cancer, breast cancer, and cervical cancer screening up to date.  Dental care up to date.  Vaccinations reviewed and up to date having received influenza  vaccination at work.         I am having Mickeal Skinner maintain her multivitamins ther. w/minerals, valACYclovir, naproxen, triamcinolone cream, cyclobenzaprine, fluticasone, omeprazole, cabotegravir & rilpivirine ER, traZODone, scopolamine, ketoconazole, and hydrOXYzine. We administered cabotegravir & rilpivirine ER.   Meds ordered this encounter  Medications   cabotegravir & rilpivirine ER (CABENUVA) 600 & 900 MG/3ML injection 1 kit     Follow-up: Return in about 6 months (around 06/14/2023). or sooner if needed.    Marcos Eke, MSN, FNP-C Nurse Practitioner [facility] Main number: [phone]

## 2022-12-15 NOTE — Assessment & Plan Note (Addendum)
Discussed importance of safe sexual practice and condom use. Condoms and STD testing offered.  Colon cancer, breast cancer, and cervical cancer screening up to date.  Dental care up to date.  Vaccinations reviewed and up to date having received influenza vaccination at work.

## 2022-12-15 NOTE — Patient Instructions (Addendum)
Nice to see you.  We will check your lab work today.  Continue to take your medication daily as prescribed.  Plan for follow up in 6 months or sooner if needed with lab work on the same day and pharmacy provider in between.   Have a great day and stay safe!

## 2022-12-15 NOTE — Assessment & Plan Note (Signed)
Jacqueline Dawson continues to have well controlled virus with good adherence and tolerance to Cabenuva .  Reviewed lab work and discussed plan of care, U equals U, and family planning. Check lab work. Continue current dose of Cabenuva . Plan for follow up in  6 months or sooner if needed with lab work on the same day and with pharmacy provider every 2 months in between.

## 2022-12-16 LAB — T-HELPER CELL (CD4) - (RCID CLINIC ONLY)
CD4 % Helper T Cell: 31 % — ABNORMAL LOW (ref 33–65)
CD4 T Cell Abs: 580 /uL (ref 400–1790)

## 2022-12-16 MED ORDER — HYDROXYZINE HCL 25 MG PO TABS: 25.0000 mg | ORAL_TABLET | Freq: Three times a day (TID) | ORAL | 2 refills | Status: AC | PRN

## 2022-12-16 NOTE — Addendum Note (Signed)
Addended by: Jeanine Luz D on: 12/16/2022 10:12 AM   Modules accepted: Orders

## 2022-12-17 ENCOUNTER — Ambulatory Visit: Payer: BC Managed Care – PPO | Admitting: Dermatology

## 2022-12-17 ENCOUNTER — Encounter: Payer: Self-pay | Admitting: Dermatology

## 2022-12-17 DIAGNOSIS — L300 Nummular dermatitis: Secondary | ICD-10-CM

## 2022-12-17 DIAGNOSIS — L219 Seborrheic dermatitis, unspecified: Secondary | ICD-10-CM | POA: Diagnosis not present

## 2022-12-17 LAB — COMPLETE METABOLIC PANEL WITH GFR
AG Ratio: 1.5 (calc) (ref 1.0–2.5)
ALT: 50 U/L — ABNORMAL HIGH (ref 6–29)
AST: 66 U/L — ABNORMAL HIGH (ref 10–35)
Albumin: 4.3 g/dL (ref 3.6–5.1)
Alkaline phosphatase (APISO): 64 U/L (ref 31–125)
BUN: 14 mg/dL (ref 7–25)
CO2: 26 mmol/L (ref 20–32)
Calcium: 9.6 mg/dL (ref 8.6–10.2)
Chloride: 106 mmol/L (ref 98–110)
Creat: 0.82 mg/dL (ref 0.50–0.99)
Globulin: 2.8 g/dL (ref 1.9–3.7)
Glucose, Bld: 81 mg/dL (ref 65–99)
Potassium: 4 mmol/L (ref 3.5–5.3)
Sodium: 140 mmol/L (ref 135–146)
Total Bilirubin: 0.2 mg/dL (ref 0.2–1.2)
Total Protein: 7.1 g/dL (ref 6.1–8.1)
eGFR: 89 mL/min/{1.73_m2} (ref >=60)

## 2022-12-17 LAB — HIV-1 RNA QUANT-NO REFLEX-BLD
HIV 1 RNA Quant: <20 {copies}/mL — ABNORMAL HIGH
HIV-1 RNA Quant, Log: <1.3 {Log_copies}/mL — ABNORMAL HIGH

## 2022-12-17 MED ORDER — CLOBETASOL PROPIONATE 0.05 % EX SOLN: 1.0000 | Freq: Two times a day (BID) | CUTANEOUS | 2 refills | Status: DC

## 2022-12-17 MED ORDER — CLOBETASOL PROPIONATE 0.05 % EX OINT: 1.0000 | TOPICAL_OINTMENT | Freq: Two times a day (BID) | CUTANEOUS | 1 refills | Status: AC

## 2022-12-17 NOTE — Progress Notes (Signed)
   New Patient Visit   Subjective  Jacqueline Dawson is a 47 y.o. female who presents for the following: New Pt - Eczema - Dandruff  Patient states she has eczema located at the lower legs that she would like to have examined. Patient reports the areas have been there for a while but increasing gotten worse over the last 2 years. She reports the areas are bothersome.Patient rates irritation (itching)8 out of 10. She states that the areas have spread mentioning that they started as very small spots. Patient reports she has previously been treated for these areas by PCP. PCP Rx'd Triamcinolone cream to use on affected areas, however, pt stated that she does not get any relief. Pt also has some scalp dandruff that she would like to address. She has been using Ketoconazole shampoo but she has seen a minimal change. Patient denies Hx of bx. Patient denies family history of skin cancer(s).  The patient has spots, moles and lesions to be evaluated, some may be new or changing and the patient may have concern these could be cancer.   The following portions of the chart were reviewed this encounter and updated as appropriate: medications, allergies, medical history  Review of Systems:  No other skin or systemic complaints except as noted in HPI or Assessment and Plan.  Objective  Well appearing patient in no apparent distress; mood and affect are within normal limits.   A focused examination was performed of the following areas: lower legs   Relevant exam findings are noted in the Assessment and Plan.           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 Inadequate response to triamcinolone. - Plan:   a. Discontinue triamcinolone.   b. Prescribe clobetasol ointment, to be applied twice daily for two weeks on, followed by a two-week break.   c. Advise the use of Aquaphor or Vaseline during off  weeks.   d. Recommend Eucerin Advanced Repair as a moisturizer, especially at night.   e. Follow-up in two months to assess improvement and discuss potential lightening cream for hyperpigmentation.  2. Seborrheic Dermatitis (Dandruff) - Assessment:itchy flaky scalp. - Plan:   a. Recommend DHS Zinc shampoo, instructing regular use and allowing it to sit for 2-3 minutes before rinsing and conditioning.   b. Prescribe clobetasol solution for immediate relief, to be used as needed up to twice daily.   c. Provide a picture of DHS Zinc shampoo in the patient's summary.   d. Follow-up in two months to assess improvement and adjust treatment as needed.   No follow-ups on file.    Documentation: I have reviewed the above documentation for accuracy and completeness, and I agree with the above.   I, Shirron Marcha Solders, CMA, am acting as scribe for [employer], DO.   Langston Reusing, DO

## 2022-12-17 NOTE — Patient Instructions (Addendum)
Hello Jacqueline Dawson,  Thank you for visiting [place] today. Your dedication to addressing your dermatological concerns and improving your health is greatly appreciated. Here is a summary of the key instructions from today's consultation:  Diagnosis on Legs: Nummular Dermatitis  - Clobetasol Ointment:   - Application: Apply a thin layer to the affected areas on your legs twice daily.   - Cycle: Follow a 2-week on treatment regimen, then take 2 weeks off. During the off weeks, apply Aquaphor or Vaseline.  - Moisturizing:   - Product: Continue using Eucerin Advanced Repair, preferably at night due to its texture.  Diagnosis on Scalp: Seborrheic Dermatitis  - DHS Zinc Shampoo:   - Usage: For your scalp, apply and let sit for 2-3 minutes before rinsing. This shampoo is available over-the-counter.  - Clobetasol Solution for Scalp:   - Application: Use as needed for immediate relief, up to twice a day. This will be prescribed and available at your pharmacy.  - Follow-Up Appointment:   - Schedule: Please return in 2 months to assess progress and discuss further treatment for pigmentation.   We look forward to seeing the progress at your next visit. If you have any questions or concerns before then, please do not hesitate to contact our office.  Warm regards,  Dr. Langston Reusing,  Dermatology        Important Information  Due to recent changes in healthcare laws, you may see results of your pathology and/or laboratory studies on MyChart before the doctors have had a chance to review them. We understand that in some cases there may be results that are confusing or concerning to you. Please understand that not all results are received at the same time and often the doctors may need to interpret multiple results in order to provide you with the best plan of care or course of treatment. Therefore, we ask that you please give [place] 2 business days to thoroughly review all your results before contacting the  office for clarification. Should we see a critical lab result, you will be contacted sooner.   If You Need Anything After Your Visit  If you have any questions or concerns for your doctor, please call our main line at [phone] If no one answers, please leave a voicemail as directed and we will return your call as soon as possible. Messages left after 4 pm will be answered the following business day.   You may also send [place] a message via MyChart. We typically respond to MyChart messages within 1-2 business days.  For prescription refills, please ask your pharmacy to contact our office. Our fax number is [phone].  If you have an urgent issue when the clinic is closed that cannot wait until the next business day, you can page your doctor at the number below.    Please note that while we do our best to be available for urgent issues outside of office hours, we are not available 24/7.   If you have an urgent issue and are unable to reach [place], you may choose to seek medical care at your doctor's office, retail clinic, urgent care center, or emergency room.  If you have a medical emergency, please immediately call 911 or go to the emergency department. In the event of inclement weather, please call our main line at [phone] for an update on the status of any delays or closures.  Dermatology Medication Tips: Please keep the boxes that topical medications come in in order to help keep track  of the instructions about where and how to use these. Pharmacies typically print the medication instructions only on the boxes and not directly on the medication tubes.   If your medication is too expensive, please contact our office at [phone] or send [place] a message through MyChart.   We are unable to tell what your co-pay for medications will be in advance as this is different depending on your insurance coverage. However, we may be able to find a substitute medication at lower cost or fill out  paperwork to get insurance to cover a needed medication.   If a prior authorization is required to get your medication covered by your insurance company, please allow [place] 1-2 business days to complete this process.  Drug prices often vary depending on where the prescription is filled and some pharmacies may offer cheaper prices.  The website www.goodrx.com contains coupons for medications through different pharmacies. The prices here do not account for what the cost may be with help from insurance (it may be cheaper with your insurance), but the website can give you the price if you did not use any insurance.  - You can print the associated coupon and take it with your prescription to the pharmacy.  - You may also stop by our office during regular business hours and pick up a GoodRx coupon card.  - If you need your prescription sent electronically to a different pharmacy, notify our office through [facility] or by phone at [phone]

## 2022-12-31 ENCOUNTER — Other Ambulatory Visit (HOSPITAL_COMMUNITY): Payer: Self-pay

## 2023-02-12 ENCOUNTER — Telehealth: Payer: Self-pay

## 2023-02-12 NOTE — Telephone Encounter (Signed)
 [facility] Patient Advocate Encounter  Patient's medications CABENUVA  have been couriered to [facility] from [facility] and will be administered at the patients appointment on 02/18/22.  Charmaine Sharps, CPhT Specialty Pharmacy Patient [facility] Phone: [phone] Fax:  [phone]

## 2023-02-17 ENCOUNTER — Ambulatory Visit: Payer: 59 | Admitting: Dermatology

## 2023-02-17 ENCOUNTER — Other Ambulatory Visit (HOSPITAL_COMMUNITY): Payer: Self-pay

## 2023-02-17 ENCOUNTER — Encounter: Payer: Self-pay | Admitting: Dermatology

## 2023-02-17 DIAGNOSIS — Z7189 Other specified counseling: Secondary | ICD-10-CM

## 2023-02-17 DIAGNOSIS — L219 Seborrheic dermatitis, unspecified: Secondary | ICD-10-CM

## 2023-02-17 DIAGNOSIS — L810 Postinflammatory hyperpigmentation: Secondary | ICD-10-CM

## 2023-02-17 DIAGNOSIS — L300 Nummular dermatitis: Secondary | ICD-10-CM

## 2023-02-17 MED ORDER — SAFETY SEAL MISCELLANEOUS MISC: 0 refills | Status: DC

## 2023-02-17 NOTE — Progress Notes (Signed)
   Follow-Up Visit   Subjective  Jacqueline Dawson is a 48 y.o. female who presents for the following: nummular eczema  Patient present today for follow up visit for nummular eczema. Patient was last evaluated on 12/17/22. Patient reports sxs are better. Patient denies medication changes.  The following portions of the chart were reviewed this encounter and updated as appropriate: medications, allergies, medical history  Review of Systems:  No other skin or systemic complaints except as noted in HPI or Assessment and Plan.  Objective  Well appearing patient in no apparent distress; mood and affect are within normal limits.  A focused examination was performed of the following areas: scalp & lower legs   Relevant exam findings are noted in the Assessment and Plan.          Assessment & Plan   Eczema w/ Secondary PIH Assessment: Patient's eczema has significantly improved with clobetasol  treatment. The condition is stable and under control, with a noticeable improvement reported by the patient, including the ability to shave legs comfortably. Examination reveals smooth skin texture, with post-inflammatory hyperpigmentation as a sequela of the resolved inflammation.  Plan: Continue current treatment regimen. Prescribe lightening cream containing tranexamic acid  from [facility], to be applied twice daily. Follow up in 6 months. Advise the use of sunscreen when wearing shorts in warmer weather to prevent darkening of hyperpigmented areas. Instruct patient to send a MyChart message if refills or assistance needed before the next appointment.  Presumed Seborrheic Dermatitis Assessment: Patient reports improvement in scalp condition with reduced scratching, and the hairdresser has noted a difference. The patient is using DHS Zinc shampoo and Clobetasol  drops as prescribed. Plan: Continue current treatment with DHS Zinc shampoo and Clobetasol  drops. Follow up in 6 months.  Sun  Protection Counseling Assessment: Patient is using Eucerin lotion with built-in sunscreen for daily use. Stronger sun protection is advised for an upcoming trip to [place] in June.'  Plan: Continue daily use of Excedrin lotion with built-in sunscreen. For beach activities and direct sun exposure, recommend mineral sunscreen or combination mineral/chemical sunscreen. Specific recommendations include Eustrema mineral sunscreen or CeraVe Sheer Tint (yellow bottle with teal writing). Educate on reapplication every 3 hours during intense sun exposure. Include sunscreen product images in the after-visit summary. NUMMULAR ECZEMA   Related Medications clobetasol  ointment (TEMOVATE ) 0.05 % Apply 1 Application topically 2 (two) times daily. Use for 2 weeks. Then stop. POST-INFLAMMATORY HYPERPIGMENTATION   Related Medications Safety Seal Miscellaneous MISC Meaxemic Cream with tranexamic acid  5% kojic acid USP 2% vit C USP 2.5% hyaluronic acid EXCP 0.1% Use on affected areas twice a day. SEBORRHEIC DERMATITIS   Related Medications clobetasol  (TEMOVATE ) 0.05 % external solution Apply 1 Application topically 2 (two) times daily.  Return in about 6 months (around 08/17/2023) for nummular eczema & seb derm.    Documentation: I have reviewed the above documentation for accuracy and completeness, and I agree with the above.   I, Shirron Maranda, CMA, am acting as scribe for [employer], DO.   Delon Lenis, DO

## 2023-02-17 NOTE — Patient Instructions (Addendum)
 Hello Jacqueline Dawson,  Thank you for visiting us  today. We appreciate your commitment to improving your health and effectively managing your skin condition.  Here is a summary of the key instructions from today's consultation:  Clobetasol  Use: Continue using Clobetasol  as previously directed for eczema flare-ups.  Prescription Lightening Cream: A prescription for a lightening cream containing tranexamic acid  will be sent to [facility].   Application: Apply this cream twice daily, followed by your regular moisturizers.   Expectation: Improvements in post-inflammatory hyperpigmentation should be visible within four months.  Scalp Treatment: Use DHS Zinc shampoo and Clobetasol  drops for your scalp as currently prescribed.  Sun Protection: Be mindful of sun exposure, especially when wearing shorts.   Sunscreen Recommendations: For everyday use, Black Girl Sunscreen is recommended. For intense sun exposure, consider a mineral sunscreen or a combination sunscreen like CeraVe.  Follow-Up: We will see you back in six months for a follow-up. However, if you need anything before then, please send us  a message through MyChart.  We are thrilled to see the progress you are making and look forward to continuing to support you on your journey to healthier skin.  Warm regards,  Dr. [facility]       Important Information  Due to recent changes in healthcare laws, you may see results of your pathology and/or laboratory studies on MyChart before the doctors have had a chance to review them. We understand that in some cases there may be results that are confusing or concerning to you. Please understand that not all results are received at the same time and often the doctors may need to interpret multiple results in order to provide you with the best plan of care or course of treatment. Therefore, we ask that you please give us  2 business days to thoroughly review all your results before  contacting the office for clarification. Should we see a critical lab result, you will be contacted sooner.   If You Need Anything After Your Visit  If you have any questions or concerns for your doctor, please call our main line at [phone] If no one answers, please leave a voicemail as directed and we will return your call as soon as possible. Messages left after 4 pm will be answered the following business day.   You may also send us  a message via MyChart. We typically respond to MyChart messages within 1-2 business days.  For prescription refills, please ask your pharmacy to contact our office. Our fax number is [phone].  If you have an urgent issue when the clinic is closed that cannot wait until the next business day, you can page your doctor at the number below.    Please note that while we do our best to be available for urgent issues outside of office hours, we are not available 24/7.   If you have an urgent issue and are unable to reach us , you may choose to seek medical care at your doctor's office, retail clinic, urgent care center, or emergency room.  If you have a medical emergency, please immediately call 911 or go to the emergency department. In the event of inclement weather, please call our main line at [phone] for an update on the status of any delays or closures.  Dermatology Medication Tips: Please keep the boxes that topical medications come in in order to help keep track of the instructions about where and how to use these. Pharmacies typically print the medication instructions only on the boxes and  not directly on the medication tubes.   If your medication is too expensive, please contact our office at [phone] or send us  a message through MyChart.   We are unable to tell what your co-pay for medications will be in advance as this is different depending on your insurance coverage. However, we may be able to find a substitute medication at lower cost  or fill out paperwork to get insurance to cover a needed medication.   If a prior authorization is required to get your medication covered by your insurance company, please allow us  1-2 business days to complete this process.  Drug prices often vary depending on where the prescription is filled and some pharmacies may offer cheaper prices.  The website www.goodrx.com contains coupons for medications through different pharmacies. The prices here do not account for what the cost may be with help from insurance (it may be cheaper with your insurance), but the website can give you the price if you did not use any insurance.  - You can print the associated coupon and take it with your prescription to the pharmacy.  - You may also stop by our office during regular business hours and pick up a GoodRx coupon card.  - If you need your prescription sent electronically to a different pharmacy, notify our office through [facility] or by phone at [phone]

## 2023-02-19 ENCOUNTER — Other Ambulatory Visit (HOSPITAL_COMMUNITY): Payer: Self-pay

## 2023-02-19 ENCOUNTER — Other Ambulatory Visit: Payer: Self-pay

## 2023-02-19 ENCOUNTER — Ambulatory Visit (INDEPENDENT_AMBULATORY_CARE_PROVIDER_SITE_OTHER): Payer: 59 | Admitting: Pharmacist

## 2023-02-19 DIAGNOSIS — B20 Human immunodeficiency virus [HIV] disease: Secondary | ICD-10-CM | POA: Diagnosis not present

## 2023-02-19 MED ORDER — CABOTEGRAVIR & RILPIVIRINE ER 600 & 900 MG/3ML IM SUER
1.0000 | Freq: Once | INTRAMUSCULAR | Status: AC
  Administered 2023-02-19: 1 via INTRAMUSCULAR

## 2023-02-19 NOTE — Progress Notes (Signed)
 HPI: Jacqueline Dawson is a 48 y.o. female who presents to the [facility] pharmacy clinic for Cabenuva  administration.  Patient Active Problem List   Diagnosis Date Noted   History of motion sickness 08/18/2022   Eczema 11/15/2021   Other fatigue 11/15/2021   Low libido 02/22/2021   Healthcare maintenance 12/17/2020   Fibroids 08/16/2020   HTN (hypertension) 01/27/2018   Adjustment disorder with anxious mood 01/15/2016   Insomnia 10/09/2010   Tobacco use 10/09/2010   ECTOPIC PREGNANCY 04/02/2007   Human immunodeficiency virus (HIV) disease (HCC) 11/11/2005   Depression 11/11/2005   ABFND PAP SMEAR HGSIL 11/11/2005    Patient's Medications  New Prescriptions   No medications on file  Previous Medications   CABOTEGRAVIR  & RILPIVIRINE  ER (CABENUVA ) 600 & 900 MG/3ML INJECTION    Inject 1 kit into the muscle every 2 (two) months.   CLOBETASOL  (TEMOVATE ) 0.05 % EXTERNAL SOLUTION    Apply 1 Application topically 2 (two) times daily.   CLOBETASOL  OINTMENT (TEMOVATE ) 0.05 %    Apply 1 Application topically 2 (two) times daily. Use for 2 weeks. Then stop.   CYCLOBENZAPRINE (FLEXERIL) 5 MG TABLET    Take 5 mg by mouth at bedtime as needed.   FLUTICASONE (FLONASE) 50 MCG/ACT NASAL SPRAY       HYDROXYZINE  (ATARAX ) 25 MG TABLET    Take 1-2 tablets (25-50 mg total) by mouth 3 (three) times daily as needed.   KETOCONAZOLE  (NIZORAL ) 2 % SHAMPOO    USE AS DIRECTED   MULTIPLE VITAMINS-MINERALS (MULTIVITAMINS THER. W/MINERALS) TABS    Take 1 tablet by mouth daily.   NAPROXEN  (NAPROSYN ) 500 MG TABLET    naproxen  500 mg tablet  Take 1 tablet every 12 hours by oral route as needed.   OMEPRAZOLE (PRILOSEC) 40 MG CAPSULE    Take 1 tablet by mouth daily.   SAFETY SEAL MISCELLANEOUS MISC    Meaxemic Cream with tranexamic acid  5% kojic acid USP 2% vit C USP 2.5% hyaluronic acid EXCP 0.1% Use on affected areas twice a day.   SCOPOLAMINE  (TRANSDERM-SCOP) 1 MG/3DAYS    Place 1 patch (1.5 mg total) onto the skin  every 3 (three) days.   TRAZODONE  (DESYREL ) 50 MG TABLET    TAKE 1/2 TABLET (25 MG TOTAL) BY MOUTH AT BEDTIME AS NEEDED FOR SLEEP.   TRIAMCINOLONE  CREAM (KENALOG ) 0.1 %    Apply 1 Application topically 2 (two) times daily.   VALACYCLOVIR  (VALTREX ) 500 MG TABLET    valacyclovir  500 mg tablet  TAKE 1 TABLET BY MOUTH EVERY DAY  Modified Medications   No medications on file  Discontinued Medications   No medications on file    Allergies: Allergies  Allergen Reactions   Sulfonamide Derivatives Rash    Labs: Lab Results  Component Value Date   HIV1RNAQUANT <20 (H) 12/15/2022   HIV1RNAQUANT Not Detected 08/18/2022   HIV1RNAQUANT Not Detected 06/18/2022   CD4TABS 580 12/15/2022   CD4TABS 355 (L) 08/22/2021   CD4TABS 365 (L) 12/17/2020    RPR and STI Lab Results  Component Value Date   LABRPR NON-REACTIVE 06/19/2020   LABRPR NON-REACTIVE 01/16/2020   LABRPR NON-REACTIVE 05/27/2019   LABRPR NON-REACTIVE 07/29/2018   LABRPR NON-REACTIVE 01/13/2018    STI Results GC CT  06/19/2020  4:47 PM Negative  Negative   01/16/2020  4:15 PM Negative  Negative   07/29/2018 12:00 AM Negative  Negative   01/13/2018 12:00 AM Negative  Negative   07/11/2015 12:00 AM Negative  Negative   01/08/2015 12:00 AM Negative  Negative   12/29/2014 12:00 AM Negative  Negative     Hepatitis B Lab Results  Component Value Date   HEPBSAB REACTIVE (A) 06/20/2021   HEPBSAG NO 04/06/2006   Hepatitis C No results found for: HEPCAB, HCVRNAPCRQN Hepatitis A Lab Results  Component Value Date   HAV REACTIVE (A) 06/18/2022   Lipids: Lab Results  Component Value Date   CHOL 213 (H) 08/18/2022   TRIG 77 08/18/2022   HDL 73 08/18/2022   CHOLHDL 2.9 08/18/2022   VLDL 12 07/16/2016   LDLCALC 123 (H) 08/18/2022    TARGET DATE: Cabenuva . TD 11th  Assessment: Patient presents today for her maintenance Cabenuva  injections. Past injections were tolerated well without issues. Last HIV RNA was <20  and CD4 580 on last visit on 12/15/22 seen by Cordella July, NP. Patient did not endorse any side effects from injections or new signs and symptoms of STI.   Patient was informed regarding the plan to continue trending her liver function. Patient informed that her current LFTs are not currently alarming for the clinical team but we would like to continue trending to prevent complications. Patient admits increased ethanol intake due to recent family loss. Patient was counseled on effect of ethanol on liver health and function. Patient takes ashwaganda and menopause supplements (with dong quai root, red clover and activated charcoal). No significant hepatic impairment risk identified with discussed supplmenets were identified. Signs and symptoms of liver function decline (e.g., jaundice, fatigue, abdominal pain, dark urine) were discussed and patient was asked to monitor.   Recommended COVID-19 vaccine but declined.   Patient previously requested to have cabotegravir  in right side and rilpivirine  in left side due to pain from having rilpivirine  in the right side.   Administered cabotegravir  600mg /29mL in right upper outer quadrant of the gluteal muscle. Administered rilpivirine  900 mg/3mL in the left upper outer quadrant of the gluteal muscle. Both given at the same time per patient preference. No issues with injections. Patient will follow up with in 2 months for next set of injections.  Plan: - Cabenuva  injections administered - CMP today to continue trend LFTs - Next pharmacy team visit scheduled for 04/14/23 and 08/21/2023  - Scheduled next visit with Cordella July, NP, on 06/26/23. Informed patient this is the last day on her window. - Call with any issues or questions  Omar Norleen GAILS. Shepard, PharmD Candidate [employer]

## 2023-02-20 LAB — COMPREHENSIVE METABOLIC PANEL
AG Ratio: 1.6 (calc) (ref 1.0–2.5)
ALT: 23 U/L (ref 6–29)
AST: 20 U/L (ref 10–35)
Albumin: 4.7 g/dL (ref 3.6–5.1)
Alkaline phosphatase (APISO): 72 U/L (ref 31–125)
BUN: 16 mg/dL (ref 7–25)
CO2: 26 mmol/L (ref 20–32)
Calcium: 9.9 mg/dL (ref 8.6–10.2)
Chloride: 106 mmol/L (ref 98–110)
Creat: 0.83 mg/dL (ref 0.50–0.99)
Globulin: 3 g/dL (ref 1.9–3.7)
Glucose, Bld: 87 mg/dL (ref 65–99)
Potassium: 4.3 mmol/L (ref 3.5–5.3)
Sodium: 142 mmol/L (ref 135–146)
Total Bilirubin: 0.3 mg/dL (ref 0.2–1.2)
Total Protein: 7.7 g/dL (ref 6.1–8.1)

## 2023-04-07 ENCOUNTER — Other Ambulatory Visit: Payer: Self-pay | Admitting: Pharmacist

## 2023-04-07 DIAGNOSIS — B20 Human immunodeficiency virus [HIV] disease: Secondary | ICD-10-CM

## 2023-04-07 MED ORDER — CABOTEGRAVIR & RILPIVIRINE ER 600 & 900 MG/3ML IM SUER: 1.0000 | INTRAMUSCULAR | 5 refills | Status: AC

## 2023-04-10 ENCOUNTER — Telehealth: Payer: Self-pay

## 2023-04-10 NOTE — Telephone Encounter (Signed)
 [facility] Patient Advocate Encounter  Patient's medications CABENUVA have been couriered to [facility] from [facility] and will be administered at the patients appointment on 04/14/23.  Kae Heller, CPhT Specialty Pharmacy Patient [facility] Phone: [phone] Fax:  [phone]

## 2023-04-13 NOTE — Progress Notes (Unsigned)
 HPI: Jacqueline Dawson is a 48 y.o. female who presents to the [facility] pharmacy clinic for [place] administration.  Patient Active Problem List   Diagnosis Date Noted   History of motion sickness 08/18/2022   Eczema 11/15/2021   Other fatigue 11/15/2021   Low libido 02/22/2021   Healthcare maintenance 12/17/2020   Fibroids 08/16/2020   HTN (hypertension) 01/27/2018   Adjustment disorder with anxious mood 01/15/2016   Insomnia 10/09/2010   Tobacco use 10/09/2010   ECTOPIC PREGNANCY 04/02/2007   Human immunodeficiency virus (HIV) disease (HCC) 11/11/2005   Depression 11/11/2005   ABFND PAP SMEAR HGSIL 11/11/2005    Patient's Medications  New Prescriptions   No medications on file  Previous Medications   CABOTEGRAVIR & RILPIVIRINE ER (CABENUVA) 600 & 900 MG/3ML INJECTION    Inject 1 kit into the muscle every 2 (two) months.   CLOBETASOL (TEMOVATE) 0.05 % EXTERNAL SOLUTION    Apply 1 Application topically 2 (two) times daily.   CLOBETASOL OINTMENT (TEMOVATE) 0.05 %    Apply 1 Application topically 2 (two) times daily. Use for 2 weeks. Then stop.   CYCLOBENZAPRINE (FLEXERIL) 5 MG TABLET    Take 5 mg by mouth at bedtime as needed.   FLUTICASONE (FLONASE) 50 MCG/ACT NASAL SPRAY       HYDROXYZINE (ATARAX) 25 MG TABLET    Take 1-2 tablets (25-50 mg total) by mouth 3 (three) times daily as needed.   KETOCONAZOLE (NIZORAL) 2 % SHAMPOO    USE AS DIRECTED   MULTIPLE VITAMINS-MINERALS (MULTIVITAMINS THER. W/MINERALS) TABS    Take 1 tablet by mouth daily.   NAPROXEN (NAPROSYN) 500 MG TABLET    naproxen 500 mg tablet  Take 1 tablet every 12 hours by oral route as needed.   OMEPRAZOLE (PRILOSEC) 40 MG CAPSULE    Take 1 tablet by mouth daily.   SAFETY SEAL MISCELLANEOUS MISC    Meaxemic Cream with tranexamic acid 5% kojic acid USP 2% vit C USP 2.5% hyaluronic acid EXCP 0.1% Use on affected areas twice a day.   SCOPOLAMINE (TRANSDERM-SCOP) 1 MG/3DAYS    Place 1 patch (1.5 mg total) onto the skin  every 3 (three) days.   TRAZODONE (DESYREL) 50 MG TABLET    TAKE 1/2 TABLET (25 MG TOTAL) BY MOUTH AT BEDTIME AS NEEDED FOR SLEEP.   TRIAMCINOLONE CREAM (KENALOG) 0.1 %    Apply 1 Application topically 2 (two) times daily.   VALACYCLOVIR (VALTREX) 500 MG TABLET    valacyclovir 500 mg tablet  TAKE 1 TABLET BY MOUTH EVERY DAY  Modified Medications   No medications on file  Discontinued Medications   No medications on file    Allergies: Allergies  Allergen Reactions   Sulfonamide Derivatives Rash    Past Medical History: Past Medical History:  Diagnosis Date   Anxiety    Depression    HIV positive (HCC)     Social History: Social History   Socioeconomic History   Marital status: Married    Spouse name: Not on file   Number of children: Not on file   Years of education: Not on file   Highest education level: Not on file  Occupational History   Not on file  Tobacco Use   Smoking status: Every Day    Current packs/day: 0.00    Average packs/day: 0.1 packs/day for 16.0 years (1.6 ttl pk-yrs)    Types: Cigarettes    Start date: 01/28/1995    Last attempt to quit: 01/28/2011  Years since quitting: 12.2   Smokeless tobacco: Never   Tobacco comments:    Reports she is down to 2 cigarettes a day   Vaping Use   Vaping status: Never Used  Substance and Sexual Activity   Alcohol use: Yes    Alcohol/week: 7.0 standard drinks of alcohol    Types: 7 Glasses of wine per week    Comment: glass of wine a day per pt   Drug use: Not Currently    Types: Marijuana    Comment: occ--11/05/20- states has been over a week   Sexual activity: Not Currently    Partners: Male    Birth control/protection: Surgical    Comment: declined condoms  Other Topics Concern   Not on file  Social History Narrative   Not on file   Social Drivers of Health   Financial Resource Strain: Not on file  Food Insecurity: Not on file  Transportation Needs: Not on file  Physical Activity: Not on file   Stress: Not on file  Social Connections: Unknown (06/24/2021)   Received from [facility]   Social Network    Social Network: Not on file    Labs: Lab Results  Component Value Date   HIV1RNAQUANT <20 (H) 12/15/2022   HIV1RNAQUANT Not Detected 08/18/2022   HIV1RNAQUANT Not Detected 06/18/2022   CD4TABS 580 12/15/2022   CD4TABS 355 (L) 08/22/2021   CD4TABS 365 (L) 12/17/2020    RPR and STI Lab Results  Component Value Date   LABRPR NON-REACTIVE 06/19/2020   LABRPR NON-REACTIVE 01/16/2020   LABRPR NON-REACTIVE 05/27/2019   LABRPR NON-REACTIVE 07/29/2018   LABRPR NON-REACTIVE 01/13/2018    STI Results GC CT  06/19/2020  4:47 PM Negative  Negative   01/16/2020  4:15 PM Negative  Negative   07/29/2018 12:00 AM Negative  Negative   01/13/2018 12:00 AM Negative  Negative   07/11/2015 12:00 AM Negative  Negative   01/08/2015 12:00 AM Negative  Negative   12/29/2014 12:00 AM Negative  Negative     Hepatitis B Lab Results  Component Value Date   HEPBSAB REACTIVE (A) 06/20/2021   HEPBSAG NO 04/06/2006   Hepatitis C No results found for: "HEPCAB", "HCVRNAPCRQN" Hepatitis A Lab Results  Component Value Date   HAV REACTIVE (A) 06/18/2022   Lipids: Lab Results  Component Value Date   CHOL 213 (H) 08/18/2022   TRIG 77 08/18/2022   HDL 73 08/18/2022   CHOLHDL 2.9 08/18/2022   VLDL 12 07/16/2016   LDLCALC 123 (H) 08/18/2022    TARGET DATE:  The 11th of the month  Assessment: Sibel presents today for their maintenance Cabenuva injections. Initial/past injections were tolerated well without issues. No problems with systemic effects of injections.   Administered cabotegravir 600mg /98mL in right upper outer quadrant of the gluteal muscle. Administered rilpivirine 900 mg/27mL in the left upper outer quadrant of the gluteal muscle. Monitored patient for 10 minutes after injection. Injections were tolerated well without issue. Patient will follow up in 2  months for next injection. Will defer HIV RNA until next office visit with Tammy Sours.   Due for Shingrix and COVID vaccinations. Declines updated COVID vaccine but will plan to start Shingrix series at her next appointment with Tammy Sours and receive the second dose with me in July.   Plan: - Cabenuva injections administered - Next injections scheduled for 5/16 with Tammy Sours and 7/11 with me  - Call with any issues or questions  Margarite Gouge, PharmD, CPP, BCIDP, AAHIVP  Clinical Pharmacist Practitioner Infectious Diseases Clinical Pharmacist [facility]

## 2023-04-14 ENCOUNTER — Other Ambulatory Visit: Payer: Self-pay

## 2023-04-14 ENCOUNTER — Ambulatory Visit: Payer: 59 | Admitting: Pharmacist

## 2023-04-14 DIAGNOSIS — B20 Human immunodeficiency virus [HIV] disease: Secondary | ICD-10-CM

## 2023-04-14 MED ORDER — CABOTEGRAVIR & RILPIVIRINE ER 600 & 900 MG/3ML IM SUER
1.0000 | Freq: Once | INTRAMUSCULAR | Status: AC
  Administered 2023-04-14: 1 via INTRAMUSCULAR

## 2023-04-15 ENCOUNTER — Other Ambulatory Visit: Payer: Self-pay | Admitting: Medical Genetics

## 2023-04-20 ENCOUNTER — Other Ambulatory Visit: Payer: Self-pay

## 2023-04-20 DIAGNOSIS — L810 Postinflammatory hyperpigmentation: Secondary | ICD-10-CM

## 2023-04-20 MED ORDER — SAFETY SEAL MISCELLANEOUS MISC: 3 refills | Status: AC

## 2023-04-20 NOTE — Progress Notes (Unsigned)
 Refill request

## 2023-04-25 ENCOUNTER — Other Ambulatory Visit
Admission: RE | Admit: 2023-04-25 | Discharge: 2023-04-25 | Disposition: A | Discharge status: Home / Self Care | Payer: Self-pay | Source: Ambulatory Visit | Attending: Medical Genetics | Admitting: Medical Genetics

## 2023-05-05 LAB — GENECONNECT MOLECULAR SCREEN: Genetic Analysis Overall Interpretation: NEGATIVE

## 2023-05-15 ENCOUNTER — Telehealth: Payer: Self-pay

## 2023-05-15 ENCOUNTER — Other Ambulatory Visit (HOSPITAL_COMMUNITY): Payer: Self-pay

## 2023-05-15 NOTE — Telephone Encounter (Signed)
 Pharmacy Patient Advocate Encounter- Cabenuva BIV-Pharmacy Benefit:  PA was submitted to [facility] and has been approved through: 05/15/23-05/14/24 Authorization#  [id number]  Please send prescription to Specialty Pharmacy: [facility]: [phone] Estimated Copay is: 0.00

## 2023-05-15 NOTE — Telephone Encounter (Signed)
 Submitted a Prior Authorization request to [facility] for Cabenuva via CoverMyMeds. Will update once we receive a response.    PA ID: [id number]

## 2023-06-03 ENCOUNTER — Other Ambulatory Visit: Payer: Self-pay | Admitting: Dermatology

## 2023-06-03 DIAGNOSIS — L219 Seborrheic dermatitis, unspecified: Secondary | ICD-10-CM

## 2023-06-17 ENCOUNTER — Telehealth: Payer: Self-pay

## 2023-06-17 NOTE — Telephone Encounter (Signed)
 [facility] Patient Advocate Encounter  Patient's medications CABENUVA  have been couriered to [facility] from [facility] and will be administered at the patients appointment on 06/26/23.  Verline Glow, CPhT Specialty Pharmacy Patient [facility] Phone: [phone] Fax:  [phone]

## 2023-06-19 ENCOUNTER — Ambulatory Visit (INDEPENDENT_AMBULATORY_CARE_PROVIDER_SITE_OTHER): Admitting: Pharmacist

## 2023-06-19 ENCOUNTER — Other Ambulatory Visit: Payer: Self-pay

## 2023-06-19 DIAGNOSIS — B20 Human immunodeficiency virus [HIV] disease: Secondary | ICD-10-CM

## 2023-06-19 DIAGNOSIS — R5383 Other fatigue: Secondary | ICD-10-CM

## 2023-06-19 MED ORDER — CABOTEGRAVIR & RILPIVIRINE ER 600 & 900 MG/3ML IM SUER
1.0000 | Freq: Once | INTRAMUSCULAR | Status: AC
Start: 2023-06-19 — End: 2023-06-19
  Administered 2023-06-19: 1 via INTRAMUSCULAR

## 2023-06-19 NOTE — Progress Notes (Signed)
 HPI: Jacqueline Dawson is a 48 y.o. female who presents to the [facility] pharmacy clinic for Cabenuva  administration.  Patient Active Problem List   Diagnosis Date Noted   History of motion sickness 08/18/2022   Eczema 11/15/2021   Other fatigue 11/15/2021   Low libido 02/22/2021   Healthcare maintenance 12/17/2020   Fibroids 08/16/2020   HTN (hypertension) 01/27/2018   Adjustment disorder with anxious mood 01/15/2016   Insomnia 10/09/2010   Tobacco use 10/09/2010   ECTOPIC PREGNANCY 04/02/2007   Human immunodeficiency virus (HIV) disease (HCC) 11/11/2005   Depression 11/11/2005   ABFND PAP SMEAR HGSIL 11/11/2005    Patient's Medications  New Prescriptions   No medications on file  Previous Medications   CABOTEGRAVIR  & RILPIVIRINE  ER (CABENUVA ) 600 & 900 MG/3ML INJECTION    Inject 1 kit into the muscle every 2 (two) months.   CLOBETASOL  (TEMOVATE ) 0.05 % EXTERNAL SOLUTION    APPLY TO AFFECTED AREA TWICE A DAY   CLOBETASOL  OINTMENT (TEMOVATE ) 0.05 %    Apply 1 Application topically 2 (two) times daily. Use for 2 weeks. Then stop.   CYCLOBENZAPRINE (FLEXERIL) 5 MG TABLET    Take 5 mg by mouth at bedtime as needed.   FLUTICASONE (FLONASE) 50 MCG/ACT NASAL SPRAY       HYDROXYZINE  (ATARAX ) 25 MG TABLET    Take 1-2 tablets (25-50 mg total) by mouth 3 (three) times daily as needed.   KETOCONAZOLE  (NIZORAL ) 2 % SHAMPOO    USE AS DIRECTED   MULTIPLE VITAMINS-MINERALS (MULTIVITAMINS THER. W/MINERALS) TABS    Take 1 tablet by mouth daily.   NAPROXEN  (NAPROSYN ) 500 MG TABLET    naproxen  500 mg tablet  Take 1 tablet every 12 hours by oral route as needed.   OMEPRAZOLE (PRILOSEC) 40 MG CAPSULE    Take 1 tablet by mouth daily.   SAFETY SEAL MISCELLANEOUS MISC    Meaxemic Cream with tranexamic acid  5% kojic acid USP 2% vit C USP 2.5% hyaluronic acid EXCP 0.1% Use on affected areas twice a day.   SCOPOLAMINE  (TRANSDERM-SCOP) 1 MG/3DAYS    Place 1 patch (1.5 mg total) onto the skin every 3 (three)  days.   TRAZODONE  (DESYREL ) 50 MG TABLET    TAKE 1/2 TABLET (25 MG TOTAL) BY MOUTH AT BEDTIME AS NEEDED FOR SLEEP.   TRIAMCINOLONE  CREAM (KENALOG ) 0.1 %    Apply 1 Application topically 2 (two) times daily.   VALACYCLOVIR  (VALTREX ) 500 MG TABLET    valacyclovir  500 mg tablet  TAKE 1 TABLET BY MOUTH EVERY DAY  Modified Medications   No medications on file  Discontinued Medications   No medications on file    Allergies: Allergies  Allergen Reactions   Sulfonamide Derivatives Rash    Past Medical History: Past Medical History:  Diagnosis Date   Anxiety    Depression    HIV positive (HCC)     Social History: Social History   Socioeconomic History   Marital status: Married    Spouse name: Not on file   Number of children: Not on file   Years of education: Not on file   Highest education level: Not on file  Occupational History   Not on file  Tobacco Use   Smoking status: Every Day    Current packs/day: 0.00    Average packs/day: 0.1 packs/day for 16.0 years (1.6 ttl pk-yrs)    Types: Cigarettes    Start date: 01/28/1995    Last attempt to quit: 01/28/2011  Years since quitting: 12.3   Smokeless tobacco: Never   Tobacco comments:    Reports she is down to 2 cigarettes a day   Vaping Use   Vaping status: Never Used  Substance and Sexual Activity   Alcohol use: Yes    Alcohol/week: 7.0 standard drinks of alcohol    Types: 7 Glasses of wine per week    Comment: glass of wine a day per pt   Drug use: Not Currently    Types: Marijuana    Comment: occ--11/05/20- states has been over a week   Sexual activity: Not Currently    Partners: Male    Birth control/protection: Surgical    Comment: declined condoms  Other Topics Concern   Not on file  Social History Narrative   Not on file   Social Drivers of Health   Financial Resource Strain: Not on file  Food Insecurity: Not on file  Transportation Needs: Not on file  Physical Activity: Not on file  Stress: Not  on file  Social Connections: Unknown (06/24/2021)   [facility]   Social Network    Social Network: Not on file    Labs: Lab Results  Component Value Date   HIV1RNAQUANT <20 (H) 12/15/2022   HIV1RNAQUANT Not Detected 08/18/2022   HIV1RNAQUANT Not Detected 06/18/2022   CD4TABS 580 12/15/2022   CD4TABS 355 (L) 08/22/2021   CD4TABS 365 (L) 12/17/2020    RPR and STI Lab Results  Component Value Date   LABRPR NON-REACTIVE 06/19/2020   LABRPR NON-REACTIVE 01/16/2020   LABRPR NON-REACTIVE 05/27/2019   LABRPR NON-REACTIVE 07/29/2018   LABRPR NON-REACTIVE 01/13/2018    STI Results GC CT  06/19/2020  4:47 PM Negative  Negative   01/16/2020  4:15 PM Negative  Negative   07/29/2018 12:00 AM Negative  Negative   01/13/2018 12:00 AM Negative  Negative   07/11/2015 12:00 AM Negative  Negative   01/08/2015 12:00 AM Negative  Negative   12/29/2014 12:00 AM Negative  Negative     Hepatitis B Lab Results  Component Value Date   HEPBSAB REACTIVE (A) 06/20/2021   HEPBSAG NO 04/06/2006   Hepatitis C No results found for: "HEPCAB", "HCVRNAPCRQN" Hepatitis A Lab Results  Component Value Date   HAV REACTIVE (A) 06/18/2022   Lipids: Lab Results  Component Value Date   CHOL 213 (H) 08/18/2022   TRIG 77 08/18/2022   HDL 73 08/18/2022   CHOLHDL 2.9 08/18/2022   VLDL 12 07/16/2016   LDLCALC 123 (H) 08/18/2022    TARGET DATE:  The 11th of the month  Assessment: Jacqueline Dawson presents today for their maintenance Cabenuva  injections. No problems with systemic effects of injections. Administered cabotegravir  600mg /36mL in right upper outer quadrant of the gluteal muscle. Administered rilpivirine  900 mg/3mL in the left upper outer quadrant of the gluteal muscle. Monitored patient for 10 minutes after injection. Injections were tolerated well without issue. Patient will follow up in 2 months for next injection.   Patient states she has been experiencing some  concerning symptoms such as changes in weight, fatigue, and headaches. She has been working on multiple life-style interventions including slight changes in her diet (decreasing sugar intake, increasing water intake, etc.) as well as limiting alcohol intake and smoking. She still drinks alcohol on the weekends and smokes ~3-4 cigarettes per day. Asked if she was interested in quitting, and she states she would like to in the next few years but does not feel like she can  realistically do this right now. States that she does not have a PCP and that Greg/[facility] has followed with these concerns in the past. She would like her thyroid  levels checked today as she historically had a slightly enlarged thyroid  in 2018. Will pass these concerns along to [facility] and allow him to address further concerns at follow up in July. Will check HIV RNA today along with other routine labs including thyroid  levels, CBC, lipid profile, and RPR.  She was also concerned about blood pressure today. In office, values ~10 minutes apart were 150/110 then 148/99. Reviewed these could be elevated due to checking in office, recently consuming caffeine, and not resting for > 10 minutes before checks. She has a home monitor and will start checking this more consistently. She is open to starting blood pressure medication if she needs and can discuss this further with Erla Haw at her next follow up.   Briefly discussed REPRIEVE trial results and reviewed her ASCVD risk score of 4.7% given current lipid profile and blood pressure. She would like to discuss statin therapy further with Erla Haw.  Eligible for Shingles vaccine which we had planned for her to start today. She would like to defer today and receive in first dose in July with Erla Haw.   Plan: - Cabenuva  injections administered - Check HIV RNA, CBC, lipid profile, RPR, TSH + free T4 - Next injections and HIV follow-up scheduled for 7/11 with Erla Haw and 9/5 with me  - Call with any issues or  questions  Nicklas Barns, PharmD, CPP, BCIDP, AAHIVP Clinical Pharmacist Practitioner Infectious Diseases Clinical Pharmacist [facility]

## 2023-06-21 LAB — LIPID PANEL
Cholesterol: 213 mg/dL — ABNORMAL HIGH (ref <200)
HDL: 74 mg/dL (ref >=50)
LDL Cholesterol (Calc): 122 mg/dL — ABNORMAL HIGH
Non-HDL Cholesterol (Calc): 139 mg/dL — ABNORMAL HIGH (ref <130)
Total CHOL/HDL Ratio: 2.9 (calc) (ref <5.0)
Triglycerides: 80 mg/dL (ref <150)

## 2023-06-21 LAB — RPR: RPR Ser Ql: NONREACTIVE

## 2023-06-21 LAB — CBC
HCT: 41.4 % (ref 35.0–45.0)
Hemoglobin: 13.3 g/dL (ref 11.7–15.5)
MCH: 26.4 pg — ABNORMAL LOW (ref 27.0–33.0)
MCHC: 32.1 g/dL (ref 32.0–36.0)
MCV: 82.1 fL (ref 80.0–100.0)
MPV: 10.9 fL (ref 7.5–12.5)
Platelets: 210 10*3/uL (ref 140–400)
RBC: 5.04 10*6/uL (ref 3.80–5.10)
RDW: 12.8 % (ref 11.0–15.0)
WBC: 6 10*3/uL (ref 3.8–10.8)

## 2023-06-21 LAB — TSH+FREE T4: TSH W/REFLEX TO FT4: 0.77 m[IU]/L

## 2023-06-21 LAB — HIV-1 RNA QUANT-NO REFLEX-BLD
HIV 1 RNA Quant: NOT DETECTED {copies}/mL
HIV-1 RNA Quant, Log: NOT DETECTED {Log_copies}/mL

## 2023-06-26 ENCOUNTER — Ambulatory Visit: Payer: Self-pay | Admitting: Family

## 2023-08-18 ENCOUNTER — Telehealth: Payer: Self-pay

## 2023-08-18 NOTE — Telephone Encounter (Signed)
 [facility] Patient Advocate Encounter  Patient's medications Cabenuva  have been couriered to [facility] from [facility] and will be administered at the patients appointment on 08/21/23.  Arland Hutchinson, CPhT Specialty Pharmacy Patient [facility] for Infectious Disease Phone: [phone] Fax:  [phone]

## 2023-08-20 ENCOUNTER — Ambulatory Visit: Payer: 59 | Admitting: Dermatology

## 2023-08-21 ENCOUNTER — Encounter: Admitting: Family

## 2023-08-21 ENCOUNTER — Other Ambulatory Visit: Payer: Self-pay

## 2023-08-21 ENCOUNTER — Encounter: Payer: Self-pay | Admitting: Pharmacist

## 2023-08-21 ENCOUNTER — Ambulatory Visit (INDEPENDENT_AMBULATORY_CARE_PROVIDER_SITE_OTHER): Admitting: Family

## 2023-08-21 ENCOUNTER — Encounter: Payer: Self-pay | Admitting: Family

## 2023-08-21 VITALS — BP 135/93 | HR 83 | Temp 98.4°F | Ht 66.0 in | Wt 146.0 lb

## 2023-08-21 DIAGNOSIS — B20 Human immunodeficiency virus [HIV] disease: Secondary | ICD-10-CM | POA: Diagnosis not present

## 2023-08-21 DIAGNOSIS — F17210 Nicotine dependence, cigarettes, uncomplicated: Secondary | ICD-10-CM | POA: Diagnosis not present

## 2023-08-21 DIAGNOSIS — Z0000 Encounter for general adult medical examination without abnormal findings: Secondary | ICD-10-CM

## 2023-08-21 DIAGNOSIS — Z720 Tobacco use: Secondary | ICD-10-CM

## 2023-08-21 MED ORDER — CABOTEGRAVIR & RILPIVIRINE ER 600 & 900 MG/3ML IM SUER
1.0000 | Freq: Once | INTRAMUSCULAR | Status: AC
  Administered 2023-08-21: 1 via INTRAMUSCULAR

## 2023-08-21 NOTE — Assessment & Plan Note (Signed)
 Discussed importance of safe sexual practice and condom use. Condoms and site specific STD testing offered.  Vaccinations reviewed and deferred Dental care up to date.  Cervical cancer screening planned for end of the month.

## 2023-08-21 NOTE — Progress Notes (Signed)
 Brief Narrative   Patient ID: Jacqueline Dawson, female    DOB: December 06, 1975, 48 y.o.   MRN: [id number]  Jacqueline Dawson is a 48 y/o AA female diagnosed with HIV disease in March 2004 with risk factor of heterosexual contact. Initial viral load was 39,100 with CD4 count 10. No Genosure was available. No history of opportunistic infection. Entered care at [facility] Stage 3. Previous ART experience with Complara, Stribild, Triumeq, and now Cabenuva .   Subjective:   Chief Complaint  Patient presents with   Medical Management of Chronic Issues    Cab/pt supplied .SABRAPt. Don't want the shingles vax anymore    HPI:  Jacqueline Dawson is a 48 y.o. female with HIV disease last seen on 06/19/23 by Jacqueline Dawson, RPH-CPP on 06/19/23 with well controlled virus and good adherence and tolerance to Cabenuva . Viral load was undetectable and CD4 count 580. Kidney function, liver function and electrolytes within normal ranges. Here today for follow up and next injection.  Jacqueline Dawson has been doing well since her last office visit and continues to receive Cabenuva  with no adverse side effects. Covered by [employer]. Housing, transportation and access to food stable. Recently had a vacation in [place]. No new concerns/complaints. Taking the hydroxyzine  at night which helps her to sleep. Trazodone  makes her groggy in the morning even at 1/2 the dose. Continues to have depressive episodes at times and questions if it is related to hormones or possible depression/anxiety. Has appointment with Gynecology at the end of the month. Healthcare maintenance reviewed. Interested in quitting smoking. Condoms and site specific STD testing offered.   Denies fevers, chills, night sweats, headaches, changes in vision, neck pain/stiffness, nausea, diarrhea, vomiting, lesions or rashes.  Lab Results  Component Value Date   CD4TCELL 31 (L) 12/15/2022   CD4TABS 580 12/15/2022   Lab Results  Component Value Date   HIV1RNAQUANT NOT DETECTED 06/19/2023      Allergies  Allergen Reactions   Sulfonamide Derivatives Rash      Outpatient Medications Prior to Visit  Medication Sig Dispense Refill   cabotegravir  & rilpivirine  ER (CABENUVA ) 600 & 900 MG/3ML injection Inject 1 kit into the muscle every 2 (two) months. 6 mL 5   clobetasol  (TEMOVATE ) 0.05 % external solution APPLY TO AFFECTED AREA TWICE A DAY 50 mL 2   clobetasol  ointment (TEMOVATE ) 0.05 % Apply 1 Application topically 2 (two) times daily. Use for 2 weeks. Then stop. 60 g 1   cyclobenzaprine (FLEXERIL) 5 MG tablet Take 5 mg by mouth at bedtime as needed.     fluticasone (FLONASE) 50 MCG/ACT nasal spray      hydrOXYzine  (ATARAX ) 25 MG tablet Take 1-2 tablets (25-50 mg total) by mouth 3 (three) times daily as needed. 90 tablet 2   ketoconazole  (NIZORAL ) 2 % shampoo USE AS DIRECTED 120 mL 1   Multiple Vitamins-Minerals (MULTIVITAMINS THER. W/MINERALS) TABS Take 1 tablet by mouth daily.     naproxen  (NAPROSYN ) 500 MG tablet naproxen  500 mg tablet  Take 1 tablet every 12 hours by oral route as needed.     omeprazole (PRILOSEC) 40 MG capsule Take 1 tablet by mouth daily.     Safety Seal Miscellaneous MISC Meaxemic Cream with tranexamic acid  5% kojic acid USP 2% vit C USP 2.5% hyaluronic acid EXCP 0.1% Use on affected areas twice a day. 15 g 3   scopolamine  (TRANSDERM-SCOP) 1 MG/3DAYS Place 1 patch (1.5 mg total) onto the skin every 3 (three) days. 4 patch 1  traZODone  (DESYREL ) 50 MG tablet TAKE 1/2 TABLET (25 MG TOTAL) BY MOUTH AT BEDTIME AS NEEDED FOR SLEEP. 45 tablet 3   triamcinolone  cream (KENALOG ) 0.1 % Apply 1 Application topically 2 (two) times daily. 80 g 0   valACYclovir  (VALTREX ) 500 MG tablet valacyclovir  500 mg tablet  TAKE 1 TABLET BY MOUTH EVERY DAY     No facility-administered medications prior to visit.     Past Medical History:  Diagnosis Date   Anxiety    Depression    HIV positive (HCC)      Past Surgical History:  Procedure Laterality Date    COLONOSCOPY  11/05/2020   LAPAROSCOPIC OVARIAN CYSTECTOMY Left 12/13/2015   Procedure: LAPAROSCOPIC OVARIAN CYSTECTOMY;  Surgeon: Jon Rummer, MD;  Location: [facility];  Service: Gynecology;  Laterality: Left;   LAPAROSCOPIC OVARIAN CYSTECTOMY Right 01/17/2022   Procedure: LAPAROSCOPIC RIGHT OOPHERECTOMY;  Surgeon: Rummer Jon, MD;  Location: [facility];  Service: Gynecology;  Laterality: Right;   TUBAL LIGATION     WISDOM TOOTH EXTRACTION          Review of Systems  Constitutional:  Negative for appetite change, chills, diaphoresis, fatigue, fever and unexpected weight change.  Eyes:        Negative for acute change in vision  Respiratory:  Negative for chest tightness, shortness of breath and wheezing.   Cardiovascular:  Negative for chest pain.  Gastrointestinal:  Negative for diarrhea, nausea and vomiting.  Genitourinary:  Negative for dysuria, pelvic pain and vaginal discharge.  Musculoskeletal:  Negative for neck pain and neck stiffness.  Skin:  Negative for rash.  Neurological:  Negative for seizures, syncope, weakness and headaches.  Hematological:  Negative for adenopathy. Does not bruise/bleed easily.  Psychiatric/Behavioral:  Negative for hallucinations.      Objective:   BP (!) 135/93   Pulse 83   Temp 98.4 F (36.9 C) (Oral)   Ht 5' 6 (1.676 m)   Wt 146 lb (66.2 kg)   SpO2 100%   BMI 23.57 kg/m  Nursing note and vital signs reviewed.  Physical Exam Constitutional:      General: She is not in acute distress.    Appearance: She is well-developed.  Eyes:     Conjunctiva/sclera: Conjunctivae normal.  Cardiovascular:     Rate and Rhythm: Normal rate and regular rhythm.     Heart sounds: Normal heart sounds. No murmur heard.    No friction rub. No gallop.  Pulmonary:     Effort: Pulmonary effort is normal. No respiratory distress.     Breath sounds: Normal breath sounds. No wheezing or rales.  Chest:     Chest wall: No tenderness.   Abdominal:     General: Bowel sounds are normal.     Palpations: Abdomen is soft.     Tenderness: There is no abdominal tenderness.  Musculoskeletal:     Cervical back: Neck supple.  Lymphadenopathy:     Cervical: No cervical adenopathy.  Skin:    General: Skin is warm and dry.     Findings: No rash.  Neurological:     Mental Status: She is alert and oriented to person, place, and time.  Psychiatric:        Mood and Affect: Mood normal.          08/21/2023   11:25 AM 12/15/2022    8:59 AM 02/13/2022    9:59 AM 11/15/2021   10:24 AM 02/22/2021    9:23 AM  Depression screen PHQ  2/9  Decreased Interest 0 0 0 0 0  Down, Depressed, Hopeless 0 0 0 0 1  PHQ - 2 Score 0 0 0 0 1        08/21/2023   11:25 AM 11/17/2016    9:07 AM  GAD 7 : Generalized Anxiety Score  Nervous, Anxious, on Edge 0 3  Control/stop worrying 0 3  Worry too much - different things 0 3  Trouble relaxing 0 3  Restless 0 3  Easily annoyed or irritable 0 3  Afraid - awful might happen 0 1  Total GAD 7 Score 0 19  Anxiety Difficulty Not difficult at all Extremely difficult     The 10-year ASCVD risk score (Arnett DK, et al., 2019) is: 2.5%   Values used to calculate the score:     Age: 38 years     Clincally relevant sex: Female     Is Non-Hispanic African American: Yes     Diabetic: No     Tobacco smoker: Yes     Systolic Blood Pressure: 135 mmHg     Is BP treated: No     HDL Cholesterol: 74 mg/dL     Total Cholesterol: 213 mg/dL      Assessment & Plan:    Patient Active Problem List   Diagnosis Date Noted   History of motion sickness 08/18/2022   Eczema 11/15/2021   Other fatigue 11/15/2021   Low libido 02/22/2021   Healthcare maintenance 12/17/2020   Fibroids 08/16/2020   HTN (hypertension) 01/27/2018   Adjustment disorder with anxious mood 01/15/2016   Insomnia 10/09/2010   Tobacco use 10/09/2010   ECTOPIC PREGNANCY 04/02/2007   Human immunodeficiency virus (HIV) disease (HCC)  11/11/2005   Depression 11/11/2005   ABFND PAP SMEAR HGSIL 11/11/2005     Problem List Items Addressed This Visit       Other   Human immunodeficiency virus (HIV) disease (HCC)   Tamia continues to have well controlled virus with good adherence and tolerance to Cabenuva  .  Reviewed lab work and discussed plan of care, U equals U, and family planning. Social determinants of health reviewed and no interventions indicated. Check lab work. Continue current dose of Cabenuva  . Plan for follow up in  2 months or sooner if needed with lab work on the same day..       Tobacco use   Adriyana continues to smoke and is interested in stopping smoking. Discussed methods of cessation and resources provided in AVS. May benefit from bupoprion to help with tobacco cessation and depression. Will await GYN appointment to determine further action.       Healthcare maintenance   Discussed importance of safe sexual practice and condom use. Condoms and site specific STD testing offered.  Vaccinations reviewed and deferred Dental care up to date.  Cervical cancer screening planned for end of the month.        Other Visit Diagnoses       Human immunodeficiency virus (HIV) disease (HCC)    -  Primary   Relevant Medications   cabotegravir  & rilpivirine  ER (CABENUVA ) 600 & 900 MG/3ML injection 1 kit (Completed)        I am having Inocente CLEMENTEEN Hales maintain her multivitamins ther. w/minerals, valACYclovir , naproxen , triamcinolone  cream, cyclobenzaprine, fluticasone, omeprazole, traZODone , scopolamine , ketoconazole , hydrOXYzine , clobetasol  ointment, cabotegravir  & rilpivirine  ER, Safety Seal Miscellaneous, and clobetasol . We administered cabotegravir  & rilpivirine  ER.   Meds ordered this encounter  Medications   cabotegravir  & rilpivirine   ER (CABENUVA ) 600 & 900 MG/3ML injection 1 kit     Follow-up: Return in about 2 months (around 10/22/2023). or sooner if needed.    Cathlyn July, MSN, FNP-C Nurse  Practitioner [facility] for [facility] Main number: [phone]

## 2023-08-21 NOTE — Assessment & Plan Note (Signed)
 Jacqueline Dawson continues to smoke and is interested in stopping smoking. Discussed methods of cessation and resources provided in AVS. May benefit from bupoprion to help with tobacco cessation and depression. Will await GYN appointment to determine further action.

## 2023-08-21 NOTE — Assessment & Plan Note (Signed)
 Ilina continues to have well controlled virus with good adherence and tolerance to Cabenuva  .  Reviewed lab work and discussed plan of care, U equals U, and family planning. Social determinants of health reviewed and no interventions indicated. Check lab work. Continue current dose of Cabenuva  . Plan for follow up in  2 months or sooner if needed with lab work on the same day.SABRA

## 2023-08-21 NOTE — Patient Instructions (Addendum)
Nice to see you.  Plan for follow up in 2 months or sooner if needed with lab work on the same day.  Have a great day and stay safe!  

## 2023-09-29 ENCOUNTER — Ambulatory Visit: Admitting: Dermatology

## 2023-10-09 ENCOUNTER — Telehealth: Payer: Self-pay

## 2023-10-09 NOTE — Telephone Encounter (Signed)
 [facility] Patient Advocate Encounter  Patient's medications CABENUVA  have been couriered to [facility] from [facility] and will be administered at the patients appointment on 10/13/23.  Charmaine Sharps, CPhT Specialty Pharmacy Patient [facility] for Infectious Disease Phone: [phone] Fax:  [phone]

## 2023-10-16 ENCOUNTER — Other Ambulatory Visit: Payer: Self-pay

## 2023-10-16 ENCOUNTER — Ambulatory Visit: Payer: Self-pay | Admitting: Pharmacist

## 2023-10-16 ENCOUNTER — Encounter: Payer: Self-pay | Admitting: Family

## 2023-10-16 ENCOUNTER — Ambulatory Visit: Admitting: Family

## 2023-10-16 VITALS — BP 150/92 | Temp 98.6°F | Wt 149.0 lb

## 2023-10-16 DIAGNOSIS — I10 Essential (primary) hypertension: Secondary | ICD-10-CM

## 2023-10-16 DIAGNOSIS — G4700 Insomnia, unspecified: Secondary | ICD-10-CM

## 2023-10-16 DIAGNOSIS — B20 Human immunodeficiency virus [HIV] disease: Secondary | ICD-10-CM | POA: Diagnosis not present

## 2023-10-16 DIAGNOSIS — R5383 Other fatigue: Secondary | ICD-10-CM | POA: Diagnosis not present

## 2023-10-16 DIAGNOSIS — F32A Depression, unspecified: Secondary | ICD-10-CM

## 2023-10-16 MED ORDER — CABOTEGRAVIR & RILPIVIRINE ER 600 & 900 MG/3ML IM SUER
1.0000 | Freq: Once | INTRAMUSCULAR | Status: AC
  Administered 2023-10-16: 1 via INTRAMUSCULAR

## 2023-10-16 NOTE — Assessment & Plan Note (Signed)
 Currently using melatonin for insomnia which is helping some.  Has previously been on hydroxyzine  and trazodone .  Given her current concerns for depression/anxiety may consider use of mirtazapine to aid in sleep and help with underlying depression/anxiety if present pending metabolic testing.  No suicidal ideations or signs of psychosis.

## 2023-10-16 NOTE — Progress Notes (Signed)
 Brief Narrative   Patient ID: Jacqueline Dawson, female    DOB: 03/08/1975, 48 y.o.   MRN: [id number]  Jacqueline Dawson is a 48 y/o AA female diagnosed with HIV disease in March 2004 with risk factor of heterosexual contact. Initial viral load was 39,100 with CD4 count 10. No Genosure was available. No history of opportunistic infection. Entered care at [facility] Stage 3. Previous ART experience with Complara, Stribild, Triumeq, and now Cabenuva .   Subjective:   Chief Complaint  Patient presents with   Follow-up    BP recheck/ Cab/     HPI:  Jacqueline Dawson is a 48 y.o. female with HIV disease last seen on 08/21/2023 with well-controlled virus and good adherence and tolerance to Cabenuva .  Viral load was undetectable with CD4 count previously 584.  Renal function within normal ranges.  Cholesterol with triglycerides 80, LDL 122, and HDL 74.  Was taking hydroxyzine  to help her sleep at night.  Was awaiting gynecology appointment for further evaluation regarding mood, and depression/anxiety to determine if it is related to her hormones.  Here today for routine follow-up.  Jacqueline Dawson has been doing okay since her last office visit and continues to receive Cabenuva  with no adverse side effects.  Has concerns about being stressed out and feeling like I am drowning.  Primary stressor is her colleagues at work.  She enjoys the work she does.  Sleep is not adequate and has been using melatonin for sleep.  Has little interest or pleasure in doing things recently feeling like she is just moving through.  Easily agitated and frustrated.  Describes so many fires to put out at once.  No suicidal ideation.  Currently working with counseling.  Has tried previous mental health medications including Lexapro , sertraline , and bupropion of which did not work for her.  Does have plans for vacation to [place]  and [place]  in the next 2 weeks.  Has concern symptoms related to metabolic versus hormone or psychiatric causes.   Healthcare maintenance reviewed.  Condoms and site-specific STD testing offered.  Denies fevers, chills, night sweats, headaches, changes in vision, neck pain/stiffness, nausea, diarrhea, vomiting, lesions or rashes.  Lab Results  Component Value Date   CD4TCELL 31 (L) 12/15/2022   CD4TABS 580 12/15/2022   Lab Results  Component Value Date   HIV1RNAQUANT NOT DETECTED 06/19/2023     Allergies  Allergen Reactions   Sulfonamide Derivatives Rash      Outpatient Medications Prior to Visit  Medication Sig Dispense Refill   cabotegravir  & rilpivirine  ER (CABENUVA ) 600 & 900 MG/3ML injection Inject 1 kit into the muscle every 2 (two) months. 6 mL 5   cetirizine-pseudoephedrine (ZYRTEC-D) 5-120 MG tablet      clobetasol  (TEMOVATE ) 0.05 % external solution APPLY TO AFFECTED AREA TWICE A DAY 50 mL 2   clobetasol  ointment (TEMOVATE ) 0.05 % Apply 1 Application topically 2 (two) times daily. Use for 2 weeks. Then stop. 60 g 1   cyclobenzaprine (FLEXERIL) 5 MG tablet Take 5 mg by mouth at bedtime as needed.     fluticasone (FLONASE) 50 MCG/ACT nasal spray      hydrOXYzine  (ATARAX ) 25 MG tablet Take 1-2 tablets (25-50 mg total) by mouth 3 (three) times daily as needed. 90 tablet 2   ketoconazole  (NIZORAL ) 2 % shampoo USE AS DIRECTED 120 mL 1   levocetirizine (XYZAL) 5 MG tablet      Multiple Vitamins-Minerals (MULTIVITAMINS THER. W/MINERALS) TABS Take 1 tablet by mouth daily.  naproxen  (NAPROSYN ) 500 MG tablet naproxen  500 mg tablet  Take 1 tablet every 12 hours by oral route as needed.     omeprazole (PRILOSEC) 40 MG capsule Take 1 tablet by mouth daily.     Safety Seal Miscellaneous MISC Meaxemic Cream with tranexamic acid  5% kojic acid USP 2% vit C USP 2.5% hyaluronic acid EXCP 0.1% Use on affected areas twice a day. 15 g 3   scopolamine  (TRANSDERM-SCOP) 1 MG/3DAYS Place 1 patch (1.5 mg total) onto the skin every 3 (three) days. 4 patch 1   traZODone  (DESYREL ) 50 MG tablet TAKE 1/2  TABLET (25 MG TOTAL) BY MOUTH AT BEDTIME AS NEEDED FOR SLEEP. 45 tablet 3   triamcinolone  cream (KENALOG ) 0.1 % Apply 1 Application topically 2 (two) times daily. 80 g 0   valACYclovir  (VALTREX ) 500 MG tablet valacyclovir  500 mg tablet  TAKE 1 TABLET BY MOUTH EVERY DAY     No facility-administered medications prior to visit.     Past Medical History:  Diagnosis Date   Anxiety    Depression    HIV positive (HCC)      Past Surgical History:  Procedure Laterality Date   COLONOSCOPY  11/05/2020   LAPAROSCOPIC OVARIAN CYSTECTOMY Left 12/13/2015   Procedure: LAPAROSCOPIC OVARIAN CYSTECTOMY;  Surgeon: Jon Rummer, MD;  Location: [facility];  Service: Gynecology;  Laterality: Left;   LAPAROSCOPIC OVARIAN CYSTECTOMY Right 01/17/2022   Procedure: LAPAROSCOPIC RIGHT OOPHERECTOMY;  Surgeon: Rummer Jon, MD;  Location: [facility];  Service: Gynecology;  Laterality: Right;   TUBAL LIGATION     WISDOM TOOTH EXTRACTION          Review of Systems  Constitutional:  Negative for appetite change, chills, diaphoresis, fatigue, fever and unexpected weight change.  Eyes:        Negative for acute change in vision  Respiratory:  Negative for chest tightness, shortness of breath and wheezing.   Cardiovascular:  Negative for chest pain.  Gastrointestinal:  Negative for diarrhea, nausea and vomiting.  Genitourinary:  Negative for dysuria, pelvic pain and vaginal discharge.  Musculoskeletal:  Negative for neck pain and neck stiffness.  Skin:  Negative for rash.  Neurological:  Negative for seizures, syncope, weakness and headaches.  Hematological:  Negative for adenopathy. Does not bruise/bleed easily.  Psychiatric/Behavioral:  Positive for decreased concentration, dysphoric mood and sleep disturbance. Negative for hallucinations, self-injury and suicidal ideas. The patient is nervous/anxious.      Objective:   BP (!) 150/92   Temp 98.6 F (37 C) (Oral)   Wt 149 lb (67.6  kg)   SpO2 100%   BMI 24.05 kg/m  Nursing note and vital signs reviewed.  BP Readings from Last 3 Encounters:  10/16/23 (!) 150/92  08/21/23 (!) 135/93  12/15/22 (!) 144/99    Physical Exam Constitutional:      General: She is not in acute distress.    Appearance: She is well-developed.  Eyes:     Conjunctiva/sclera: Conjunctivae normal.  Cardiovascular:     Rate and Rhythm: Normal rate and regular rhythm.     Heart sounds: Normal heart sounds. No murmur heard.    No friction rub. No gallop.  Pulmonary:     Effort: Pulmonary effort is normal. No respiratory distress.     Breath sounds: Normal breath sounds. No wheezing or rales.  Chest:     Chest wall: No tenderness.  Abdominal:     General: Bowel sounds are normal.     Palpations: Abdomen  is soft.     Tenderness: There is no abdominal tenderness.  Musculoskeletal:     Cervical back: Neck supple.  Lymphadenopathy:     Cervical: No cervical adenopathy.  Skin:    General: Skin is warm and dry.     Findings: No rash.  Neurological:     Mental Status: She is alert and oriented to person, place, and time.  Psychiatric:        Attention and Perception: Attention normal.        Mood and Affect: Affect normal. Mood is anxious.        Speech: Speech normal.        Behavior: Behavior normal.        Thought Content: Thought content normal.        Cognition and Memory: Cognition normal.        Judgment: Judgment normal.          08/21/2023   11:25 AM 12/15/2022    8:59 AM 02/13/2022    9:59 AM 11/15/2021   10:24 AM 02/22/2021    9:23 AM  Depression screen PHQ 2/9  Decreased Interest 0 0 0 0 0  Down, Depressed, Hopeless 0 0 0 0 1  PHQ - 2 Score 0 0 0 0 1        08/21/2023   11:25 AM 11/17/2016    9:07 AM  GAD 7 : Generalized Anxiety Score  Nervous, Anxious, on Edge 0 3  Control/stop worrying 0 3  Worry too much - different things 0 3  Trouble relaxing 0 3  Restless 0 3  Easily annoyed or irritable 0 3  Afraid -  awful might happen 0 1  Total GAD 7 Score 0 19  Anxiety Difficulty Not difficult at all Extremely difficult     The 10-year ASCVD risk score (Arnett DK, et al., 2019) is: 3.9%   Values used to calculate the score:     Age: 89 years     Clincally relevant sex: Female     Is Non-Hispanic African American: Yes     Diabetic: No     Tobacco smoker: Yes     Systolic Blood Pressure: 150 mmHg     Is BP treated: No     HDL Cholesterol: 74 mg/dL     Total Cholesterol: 213 mg/dL      Assessment & Plan:    Patient Active Problem List   Diagnosis Date Noted   History of motion sickness 08/18/2022   Eczema 11/15/2021   Fatigue due to depression 11/15/2021   Low libido 02/22/2021   Healthcare maintenance 12/17/2020   Fibroids 08/16/2020   HTN (hypertension) 01/27/2018   Adjustment disorder with anxious mood 01/15/2016   Insomnia 10/09/2010   Tobacco use 10/09/2010   ECTOPIC PREGNANCY 04/02/2007   Human immunodeficiency virus (HIV) disease (HCC) 11/11/2005   Depression 11/11/2005   ABFND PAP SMEAR HGSIL 11/11/2005     Problem List Items Addressed This Visit       Cardiovascular and Mediastinum   HTN (hypertension)   Blood pressure elevated today. Discussed possibility of starting blood pressure medication although suspect this is secondary to her current mood. Discussed possibly starting low-dose medication.  No neurologic/ophthalmologic signs/symptoms.  Following discussion we will monitor blood pressure at home for more accurate reading and if remains elevated will consider starting blood pressure medication.      Relevant Orders   Thyroid  Panel With TSH     Other   Human immunodeficiency  virus (HIV) disease (HCC) - Primary   Ms. Doepke continues to have well-controlled virus with good adherence and tolerance to Cabenuva .  Reviewed previous lab work and discussed plan of care and U equals U.  Social determinants of health reviewed with no interventions indicated.  Injection of  Cabenuva  provided without complication.  Check blood work.  Plan for follow-up in 4 months or sooner if needed and with pharmacy provider in between.      Relevant Orders   Comprehensive metabolic panel with GFR   HIV-1 RNA quant-no reflex-bld   T-helper cells (CD4) count (not at [facility])   Insomnia   Currently using melatonin for insomnia which is helping some.  Has previously been on hydroxyzine  and trazodone .  Given her current concerns for depression/anxiety may consider use of mirtazapine to aid in sleep and help with underlying depression/anxiety if present pending metabolic testing.  No suicidal ideations or signs of psychosis.      Fatigue due to depression   Ms. Dickie continues to have increased levels of fatigue and stress.  Will check for metabolic sources including TSH, B12, folate, estrogen, and testosterone .  Currently in counseling and may need to consider additional behavioral health medications if nothing is found metabolically as cannot exclude depression/anxiety.  May also benefit from cardiovascular evaluation at some point if fatigue continues and no improvements with medications.      Relevant Orders   Thyroid  Panel With TSH   B12 and Folate Panel   Estrogens , Total   Vitamin D  (25 hydroxy)   Testosterone , Total, LC/MS/MS     I am having Inocente CLEMENTEEN Gwyneth maintain her multivitamins ther. w/minerals, valACYclovir , naproxen , triamcinolone  cream, cyclobenzaprine, fluticasone, omeprazole, traZODone , scopolamine , ketoconazole , hydrOXYzine , clobetasol  ointment, cabotegravir  & rilpivirine  ER, Safety Seal Miscellaneous, clobetasol , cetirizine-pseudoephedrine, and levocetirizine. We administered cabotegravir  & rilpivirine  ER.   Meds ordered this encounter  Medications   cabotegravir  & rilpivirine  ER (CABENUVA ) 600 & 900 MG/3ML injection 1 kit     Follow-up: Return in about 2 months (around 12/16/2023). or sooner if needed.    Cathlyn July, MSN, FNP-C Nurse  Practitioner [facility]ious [facility] Main number: [phone]

## 2023-10-16 NOTE — Assessment & Plan Note (Signed)
 Blood pressure elevated today. Discussed possibility of starting blood pressure medication although suspect this is secondary to her current mood. Discussed possibly starting low-dose medication.  No neurologic/ophthalmologic signs/symptoms.  Following discussion we will monitor blood pressure at home for more accurate reading and if remains elevated will consider starting blood pressure medication.

## 2023-10-16 NOTE — Patient Instructions (Addendum)
 Nice to see you.  We will check your lab work today.  Continue to take your medication daily as prescribed.  Plan for follow up in 2 months or sooner if needed with lab work on the same day. Between the 4-11th of the month.   Have a great day and stay safe!

## 2023-10-16 NOTE — Assessment & Plan Note (Signed)
 Jacqueline Dawson continues to have well-controlled virus with good adherence and tolerance to Cabenuva .  Reviewed previous lab work and discussed plan of care and U equals U.  Social determinants of health reviewed with no interventions indicated.  Injection of Cabenuva  provided without complication.  Check blood work.  Plan for follow-up in 4 months or sooner if needed and with pharmacy provider in between.

## 2023-10-16 NOTE — Assessment & Plan Note (Signed)
 Jacqueline Dawson continues to have increased levels of fatigue and stress.  Will check for metabolic sources including TSH, B12, folate, estrogen, and testosterone .  Currently in counseling and may need to consider additional behavioral health medications if nothing is found metabolically as cannot exclude depression/anxiety.  May also benefit from cardiovascular evaluation at some point if fatigue continues and no improvements with medications.

## 2023-10-21 ENCOUNTER — Other Ambulatory Visit: Payer: Self-pay | Admitting: Family

## 2023-10-21 DIAGNOSIS — G4700 Insomnia, unspecified: Secondary | ICD-10-CM

## 2023-10-21 LAB — COMPREHENSIVE METABOLIC PANEL WITH GFR
AG Ratio: 1.7 (calc) (ref 1.0–2.5)
ALT: 15 U/L (ref 6–29)
AST: 16 U/L (ref 10–35)
Albumin: 4.5 g/dL (ref 3.6–5.1)
Alkaline phosphatase (APISO): 67 U/L (ref 31–125)
BUN: 14 mg/dL (ref 7–25)
CO2: 27 mmol/L (ref 20–32)
Calcium: 9.7 mg/dL (ref 8.6–10.2)
Chloride: 106 mmol/L (ref 98–110)
Creat: 0.81 mg/dL (ref 0.50–0.99)
Globulin: 2.6 g/dL (ref 1.9–3.7)
Glucose, Bld: 85 mg/dL (ref 65–99)
Potassium: 4.1 mmol/L (ref 3.5–5.3)
Sodium: 141 mmol/L (ref 135–146)
Total Bilirubin: 0.2 mg/dL (ref 0.2–1.2)
Total Protein: 7.1 g/dL (ref 6.1–8.1)
eGFR: 89 mL/min/1.73m2 (ref >=60)

## 2023-10-21 LAB — THYROID PANEL WITH TSH
Free Thyroxine Index: 2.3 (ref 1.4–3.8)
T3 Uptake: 26 % (ref 22–35)
T4, Total: 8.7 ug/dL (ref 5.1–11.9)
TSH: 0.62 m[IU]/L

## 2023-10-21 LAB — B12 AND FOLATE PANEL
Folate: 14.4 ng/mL
Vitamin B-12: 604 pg/mL (ref 200–1100)

## 2023-10-21 LAB — VITAMIN D 25 HYDROXY (VIT D DEFICIENCY, FRACTURES): Vit D, 25-Hydroxy: 36 ng/mL (ref 30–100)

## 2023-10-21 LAB — ESTROGENS, TOTAL: Estrogen: 87 pg/mL

## 2023-10-21 LAB — TESTOSTERONE, TOTAL, LC/MS/MS: Testosterone, Total, LC-MS-MS: 18 ng/dL (ref 2–45)

## 2023-10-21 LAB — HIV-1 RNA QUANT-NO REFLEX-BLD
HIV 1 RNA Quant: NOT DETECTED {copies}/mL
HIV-1 RNA Quant, Log: NOT DETECTED {Log_copies}/mL

## 2023-10-21 LAB — T-HELPER CELLS (CD4) COUNT (NOT AT ARMC)
Absolute CD4: 536 {cells}/uL (ref 490–1740)
CD4 T Helper %: 28 % — ABNORMAL LOW (ref 30–61)
Total lymphocyte count: 1931 {cells}/uL (ref 850–3900)

## 2023-11-12 ENCOUNTER — Ambulatory Visit
Admission: EM | Admit: 2023-11-12 | Discharge: 2023-11-12 | Disposition: A | Discharge status: Home / Self Care | Attending: Internal Medicine | Admitting: Internal Medicine

## 2023-11-12 ENCOUNTER — Encounter: Payer: Self-pay | Admitting: Emergency Medicine

## 2023-11-12 DIAGNOSIS — U071 COVID-19: Secondary | ICD-10-CM

## 2023-11-12 DIAGNOSIS — D84821 Immunodeficiency due to drugs: Secondary | ICD-10-CM | POA: Diagnosis not present

## 2023-11-12 DIAGNOSIS — F17210 Nicotine dependence, cigarettes, uncomplicated: Secondary | ICD-10-CM

## 2023-11-12 DIAGNOSIS — J069 Acute upper respiratory infection, unspecified: Secondary | ICD-10-CM | POA: Diagnosis not present

## 2023-11-12 LAB — POC COVID19/FLU A&B COMBO
Covid Antigen, POC: POSITIVE — AB
Influenza A Antigen, POC: NEGATIVE
Influenza B Antigen, POC: NEGATIVE

## 2023-11-12 MED ORDER — GUAIFENESIN ER 1200 MG PO TB12: 1200.0000 mg | ORAL_TABLET | Freq: Two times a day (BID) | ORAL | 0 refills | Status: AC

## 2023-11-12 MED ORDER — ONDANSETRON 4 MG PO TBDP: 4.0000 mg | ORAL_TABLET | Freq: Three times a day (TID) | ORAL | 0 refills | Status: AC | PRN

## 2023-11-12 MED ORDER — PROMETHAZINE-DM 6.25-15 MG/5ML PO SYRP: 5.0000 mL | ORAL_SOLUTION | Freq: Every evening | ORAL | 0 refills | Status: AC | PRN

## 2023-11-12 MED ORDER — PAXLOVID (300/100) 20 X 150 MG & 10 X 100MG PO TBPK: 3.0000 | ORAL_TABLET | Freq: Two times a day (BID) | ORAL | 0 refills | Status: AC

## 2023-11-12 NOTE — Discharge Instructions (Signed)
 You have COVID-19 viral illness. Wear a mask for 5 days of symptoms. You may return to public within those 5 days as long as you do not have a fever. Wash hands frequently.  Take paxlovid as prescribed. Start medicine tonight. 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  Hold off on taking the following medications while you are taking paxlovid: trazodone   Take zofran  every 8 hours as needed for nausea/vomiting.   Plain Guaifenesin (Mucinex) may help break up mucous and can be purchased over the counter.   Take Promethazine DM cough medication to help with your cough at nighttime so that you are able to sleep. Do not drive, drink alcohol, or go to work while taking this medication since it can make you sleepy.   Two teaspoons of honey in warm water every 4-6 hours may help with throat pains Humidifier in your room at night to help add water the air and soothe cough  If you develop any new or worsening symptoms or if your symptoms do not start to improve, please return here or follow-up with your primary care provider. If your symptoms are severe, please go to the emergency room.

## 2023-11-12 NOTE — ED Triage Notes (Signed)
 Patient reports low grade fever, body aches, head ache, cough, chills x 1 day. Patient reports two  episode of diarrhea on last night.  Rates body aches 6/10 and rates head ache 8/10. Patient has taken Nyquil/ Naproxen  with no relief.

## 2023-11-12 NOTE — ED Provider Notes (Signed)
 [facility]    CSN: [id number] Arrival date & time: 11/12/23  1604      History   Chief Complaint Chief Complaint  Patient presents with   Fever   Cough   Chills   Generalized Body Aches   Headache    HPI Jacqueline Dawson is a 48 y.o. female.   Jacqueline Dawson is a 48 y.o. female presenting for chief complaint of Fever, Cough, Chills, Generalized Body Aches, and Headache that started yesterday on November 11, 2023.  Cough is minimally productive.  Also reports nausea without vomiting and a few episodes of diarrhea without blood in the output.  She had a low-grade temp last night and a 9.9-100.2.  History of HIV, states she takes her antiviral consistently without missed doses.  She is a current everyday cigarette smoker, denies history of asthma/COPD.  Denies shortness of breath, chest pain, heart palpitations, rashes, and dizziness. Denies history of COVID in the last 90 days. She is taking OTC medications.    Fever Associated symptoms: cough and headaches   Cough Associated symptoms: fever and headaches   Headache Associated symptoms: cough and fever     Past Medical History:  Diagnosis Date   Anxiety    Depression    HIV positive (HCC)     Patient Active Problem List   Diagnosis Date Noted   History of motion sickness 08/18/2022   Eczema 11/15/2021   Fatigue due to depression 11/15/2021   Low libido 02/22/2021   Healthcare maintenance 12/17/2020   Fibroids 08/16/2020   HTN (hypertension) 01/27/2018   Adjustment disorder with anxious mood 01/15/2016   Insomnia 10/09/2010   Tobacco use 10/09/2010   ECTOPIC PREGNANCY 04/02/2007   Human immunodeficiency virus (HIV) disease (HCC) 11/11/2005   Depression 11/11/2005   ABFND PAP SMEAR HGSIL 11/11/2005    Past Surgical History:  Procedure Laterality Date   COLONOSCOPY  11/05/2020   LAPAROSCOPIC OVARIAN CYSTECTOMY Left 12/13/2015   Procedure: LAPAROSCOPIC OVARIAN CYSTECTOMY;  Surgeon: Jon Rummer, MD;   Location: [facility];  Service: Gynecology;  Laterality: Left;   LAPAROSCOPIC OVARIAN CYSTECTOMY Right 01/17/2022   Procedure: LAPAROSCOPIC RIGHT OOPHERECTOMY;  Surgeon: Rummer Jon, MD;  Location: [facility];  Service: Gynecology;  Laterality: Right;   TUBAL LIGATION     WISDOM TOOTH EXTRACTION      OB History   No obstetric history on file.      Home Medications    Prior to Admission medications   Medication Sig Start Date End Date Taking? Authorizing Provider  Guaifenesin 1200 MG TB12 Take 1 tablet (1,200 mg total) by mouth in the morning and at bedtime. 11/12/23  Yes Enedelia Dorna HERO, FNP  nirmatrelvir/ritonavir (PAXLOVID, 300/100,) 20 x 150 MG & 10 x 100MG  TBPK Take 3 tablets by mouth 2 (two) times daily for 5 days. Patient GFR is greater than 60.Take nirmatrelvir (150 mg) two tablets twice daily for 5 days and ritonavir (100 mg) one tablet twice daily for 5 days. 11/12/23 11/17/23 Yes Petro Talent, Dorna HERO, FNP  ondansetron  (ZOFRAN -ODT) 4 MG disintegrating tablet Take 1 tablet (4 mg total) by mouth every 8 (eight) hours as needed for nausea or vomiting. 11/12/23  Yes Enedelia Dorna HERO, FNP  promethazine-dextromethorphan (PROMETHAZINE-DM) 6.25-15 MG/5ML syrup Take 5 mLs by mouth at bedtime as needed for cough. 11/12/23  Yes Enedelia Dorna HERO, FNP  cabotegravir  & rilpivirine  ER (CABENUVA ) 600 & 900 MG/3ML injection Inject 1 kit into the muscle every 2 (two) months.  04/07/23   Waddell Alan PARAS, RPH-CPP  cetirizine-pseudoephedrine (ZYRTEC-D) 5-120 MG tablet     [provider]  clobetasol  (TEMOVATE ) 0.05 % external solution APPLY TO AFFECTED AREA TWICE A DAY 06/03/23   Alm Delon SAILOR, DO  clobetasol  ointment (TEMOVATE ) 0.05 % Apply 1 Application topically 2 (two) times daily. Use for 2 weeks. Then stop. 12/17/22   Alm Delon SAILOR, DO  cyclobenzaprine (FLEXERIL) 5 MG tablet Take 5 mg by mouth at bedtime as needed. 02/11/22   [provider]   fluticasone (FLONASE) 50 MCG/ACT nasal spray     [provider]  hydrOXYzine  (ATARAX ) 25 MG tablet Take 1-2 tablets (25-50 mg total) by mouth 3 (three) times daily as needed. 12/16/22   Calone, Gregory D, FNP  ketoconazole  (NIZORAL ) 2 % shampoo USE AS DIRECTED 10/29/22   Calone, Gregory D, FNP  levocetirizine (XYZAL) 5 MG tablet     [provider]  Multiple Vitamins-Minerals (MULTIVITAMINS THER. W/MINERALS) TABS Take 1 tablet by mouth daily.    [provider]  naproxen  (NAPROSYN ) 500 MG tablet naproxen  500 mg tablet  Take 1 tablet every 12 hours by oral route as needed.    [provider]  omeprazole (PRILOSEC) 40 MG capsule Take 1 tablet by mouth daily.    [provider]  Safety Seal Miscellaneous MISC Meaxemic Cream with tranexamic acid  5% kojic acid USP 2% vit C USP 2.5% hyaluronic acid EXCP 0.1% Use on affected areas twice a day. 04/20/23   Alm Delon SAILOR, DO  scopolamine  (TRANSDERM-SCOP) 1 MG/3DAYS Place 1 patch (1.5 mg total) onto the skin every 3 (three) days. 08/18/22   Calone, Gregory D, FNP  traZODone  (DESYREL ) 50 MG tablet TAKE 1/2 TABLET (25 MG TOTAL) BY MOUTH AT BEDTIME AS NEEDED FOR SLEEP. 10/22/23   Calone, Gregory D, FNP  triamcinolone  cream (KENALOG ) 0.1 % Apply 1 Application topically 2 (two) times daily. 11/15/21   Calone, Gregory D, FNP  valACYclovir  (VALTREX ) 500 MG tablet valacyclovir  500 mg tablet  TAKE 1 TABLET BY MOUTH EVERY DAY    [provider]    Family History Family History  Problem Relation Age of Onset   Colon polyps Mother    Hypertension Mother    Colon cancer Maternal Aunt    Cancer - Cervical Maternal Aunt    Esophageal cancer Neg Hx    Rectal cancer Neg Hx    Stomach cancer Neg Hx     Social History Social History   Tobacco Use   Smoking status: Every Day    Current packs/day: 0.00    Average packs/day: 0.1 packs/day for 16.0 years (1.6 ttl pk-yrs)    Types: Cigarettes    Start date:  01/28/1995    Last attempt to quit: 01/28/2011    Years since quitting: 12.7   Smokeless tobacco: Never   Tobacco comments:    Reports she is down to 2 cigarettes a day   Vaping Use   Vaping status: Never Used  Substance Use Topics   Alcohol use: Yes    Alcohol/week: 7.0 standard drinks of alcohol    Types: 7 Glasses of wine per week    Comment: glass of wine a day per pt   Drug use: Not Currently    Types: Marijuana    Comment: occ--11/05/20- states has been over a week     Allergies   Sulfonamide derivatives   Review of Systems Review of Systems  Constitutional:  Positive for fever.  Respiratory:  Positive for cough.   Neurological:  Positive for headaches.  Per HPI   Physical Exam Triage Vital Signs ED Triage Vitals  Encounter Vitals Group     BP 11/12/23 1714 138/88     Girls Systolic BP Percentile --      Girls Diastolic BP Percentile --      Boys Systolic BP Percentile --      Boys Diastolic BP Percentile --      Pulse Rate 11/12/23 1714 94     Resp 11/12/23 1714 20     Temp 11/12/23 1714 99.4 F (37.4 C)     Temp Source 11/12/23 1714 Oral     SpO2 11/12/23 1714 98 %     Weight --      Height --      Head Circumference --      Peak Flow --      Pain Score 11/12/23 1708 6     Pain Loc --      Pain Education --      Exclude from Growth Chart --    No data found.  Updated Vital Signs BP 138/88 (BP Location: Right Arm)   Pulse 94   Temp 99.4 F (37.4 C) (Oral)   Resp 20   SpO2 98%   Visual Acuity Right Eye Distance:   Left Eye Distance:   Bilateral Distance:    Right Eye Near:   Left Eye Near:    Bilateral Near:     Physical Exam Vitals and nursing note reviewed.  Constitutional:      Appearance: She is not ill-appearing or toxic-appearing.  HENT:     Head: Normocephalic and atraumatic.     Right Ear: Hearing, tympanic membrane, ear canal and external ear normal.     Left Ear: Hearing, tympanic membrane, ear canal and external ear  normal.     Nose: Congestion present.     Mouth/Throat:     Lips: Pink.     Mouth: Mucous membranes are moist. No injury or oral lesions.     Dentition: Normal dentition.     Tongue: No lesions.     Pharynx: Oropharynx is clear. Uvula midline. No pharyngeal swelling, oropharyngeal exudate, posterior oropharyngeal erythema, uvula swelling or postnasal drip.     Tonsils: No tonsillar exudate.  Eyes:     General: Lids are normal. Vision grossly intact. Gaze aligned appropriately.     Extraocular Movements: Extraocular movements intact.     Conjunctiva/sclera: Conjunctivae normal.  Neck:     Trachea: Trachea and phonation normal.  Cardiovascular:     Rate and Rhythm: Normal rate and regular rhythm.     Heart sounds: Normal heart sounds, S1 normal and S2 normal.  Pulmonary:     Effort: Pulmonary effort is normal. No respiratory distress.     Breath sounds: Normal breath sounds and air entry. No wheezing, rhonchi or rales.  Chest:     Chest wall: No tenderness.  Musculoskeletal:     Cervical back: Neck supple.  Lymphadenopathy:     Cervical: No cervical adenopathy.  Skin:    General: Skin is warm and dry.     Capillary Refill: Capillary refill takes less than 2 seconds.     Findings: No rash.  Neurological:     General: No focal deficit present.     Mental Status: She is alert and oriented to person, place, and time. Mental status is at baseline.     Cranial Nerves: No dysarthria or  facial asymmetry.  Psychiatric:        Mood and Affect: Mood normal.        Speech: Speech normal.        Behavior: Behavior normal.        Thought Content: Thought content normal.        Judgment: Judgment normal.      UC Treatments / Results  Labs (all labs ordered are listed, but only abnormal results are displayed) Labs Reviewed  POC COVID19/FLU A&B COMBO - Abnormal; Notable for the following components:      Result Value   Covid Antigen, POC Positive (*)    All other components within  normal limits    EKG   Radiology No results found.  Procedures Procedures (including critical care time)  Medications Ordered in UC Medications - No data to display  Initial Impression / Assessment and Plan / UC Course  I have reviewed the triage vital signs and the nursing notes.  Pertinent labs & imaging results that were available during my care of the patient were reviewed by me and considered in my medical decision making (see chart for details).   1.  Viral URI with cough, COVID-19 virus infection, immunosuppression due to drug therapy, cigarette nicotine dependence without complication COVID testing positive in clinic.  Lungs clear, vitals hemodynamically stable, therefore deferred imaging of the chest.  Patient is a candidate for antiviral (Paxlovid) given risk factors for severe COVID-19 illness.  Antiviral sent to pharmacy. Most recent GFR greater than 60 (89) based on blood work from September 2025.  Daily medications checked for interactions with [place] medication checker tool prior to prescribing.  Hold trazodone  while taking antiviral, then resume after finished.  Okay  Discussed most up to date CDC guidelines regarding quarantine and masking to prevent transmission.  Recommend supportive care for symptomatic relief as outlined in AVS.    Counseled patient on potential for adverse effects with medications prescribed/recommended today, strict ER and return-to-clinic precautions discussed, patient verbalized understanding.    Final Clinical Impressions(s) / UC Diagnoses   Final diagnoses:  Viral URI with cough  COVID-19 virus infection  Immunosuppression due to drug therapy  Cigarette nicotine dependence without complication     Discharge Instructions      You have COVID-19 viral illness. Wear a mask for 5 days of symptoms. You may return to public within those 5 days as long as you do not have a fever. Wash hands frequently.  Take paxlovid as  prescribed. Start medicine tonight. 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  Hold off on taking the following medications while you are taking paxlovid: trazodone   Take zofran  every 8 hours as needed for nausea/vomiting.   Plain Guaifenesin (Mucinex) may help break up mucous and can be purchased over the counter.   Take Promethazine DM cough medication to help with your cough at nighttime so that you are able to sleep. Do not drive, drink alcohol, or go to work while taking this medication since it can make you sleepy.   Two teaspoons of honey in warm water every 4-6 hours may help with throat pains Humidifier in your room at night to help add water the air and soothe cough  If you develop any new or worsening symptoms or if your symptoms do not start to improve, please return here or follow-up with your primary care provider. If your symptoms are severe, please go to the emergency room.  ED Prescriptions     Medication Sig Dispense Auth. Provider   nirmatrelvir/ritonavir (PAXLOVID, 300/100,) 20 x 150 MG & 10 x 100MG  TBPK Take 3 tablets by mouth 2 (two) times daily for 5 days. Patient GFR is greater than 60.Take nirmatrelvir (150 mg) two tablets twice daily for 5 days and ritonavir (100 mg) one tablet twice daily for 5 days. 30 tablet Enedelia Dorna HERO, FNP   ondansetron  (ZOFRAN -ODT) 4 MG disintegrating tablet Take 1 tablet (4 mg total) by mouth every 8 (eight) hours as needed for nausea or vomiting. 20 tablet Geneviene Tesch M, FNP   promethazine-dextromethorphan (PROMETHAZINE-DM) 6.25-15 MG/5ML syrup Take 5 mLs by mouth at bedtime as needed for cough. 118 mL Enedelia Dorna M, FNP   Guaifenesin 1200 MG TB12 Take 1 tablet (1,200 mg total) by mouth in the morning and at bedtime. 14 tablet Enedelia Dorna HERO, FNP      PDMP not reviewed this encounter.   Enedelia Dorna HERO, [place] 11/12/23 [place]

## 2023-12-04 ENCOUNTER — Other Ambulatory Visit: Payer: Self-pay | Admitting: Family

## 2023-12-08 ENCOUNTER — Telehealth: Payer: Self-pay

## 2023-12-08 NOTE — Telephone Encounter (Signed)
 [facility] Patient Advocate Encounter   I was successful in securing patient a $5000.00 grant from [employer] ([employer]) to provide copayment coverage for Cabenuva .  This will make the out of pocket cost $0.00.     I have spoken with the patient.    The billing information is as follows and has been shared with [facility].           Patient knows to call the office with questions or concerns.  Arland Hutchinson, CPhT Specialty Pharmacy Patient [facility] for Infectious Disease Phone: [phone] Fax:  [phone]

## 2023-12-14 NOTE — Progress Notes (Deleted)
 HPI: Jacqueline Dawson is a 48 y.o. female who presents to the [facility] pharmacy clinic for Cabenuva  administration.  Referring ID Physician: Cathlyn July, NP   Patient Active Problem List   Diagnosis Date Noted   History of motion sickness 08/18/2022   Eczema 11/15/2021   Fatigue due to depression 11/15/2021   Low libido 02/22/2021   Healthcare maintenance 12/17/2020   Fibroids 08/16/2020   HTN (hypertension) 01/27/2018   Adjustment disorder with anxious mood 01/15/2016   Insomnia 10/09/2010   Tobacco use 10/09/2010   ECTOPIC PREGNANCY 04/02/2007   Human immunodeficiency virus (HIV) disease (HCC) 11/11/2005   Depression 11/11/2005   ABFND PAP SMEAR HGSIL 11/11/2005    Patient's Medications  New Prescriptions   No medications on file  Previous Medications   CABOTEGRAVIR  & RILPIVIRINE  ER (CABENUVA ) 600 & 900 MG/3ML INJECTION    Inject 1 kit into the muscle every 2 (two) months.   CETIRIZINE-PSEUDOEPHEDRINE (ZYRTEC-D) 5-120 MG TABLET       CLOBETASOL  (TEMOVATE ) 0.05 % EXTERNAL SOLUTION    APPLY TO AFFECTED AREA TWICE A DAY   CLOBETASOL  OINTMENT (TEMOVATE ) 0.05 %    Apply 1 Application topically 2 (two) times daily. Use for 2 weeks. Then stop.   CYCLOBENZAPRINE (FLEXERIL) 5 MG TABLET    Take 5 mg by mouth at bedtime as needed.   FLUTICASONE (FLONASE) 50 MCG/ACT NASAL SPRAY       GUAIFENESIN 1200 MG TB12    Take 1 tablet (1,200 mg total) by mouth in the morning and at bedtime.   HYDROXYZINE  (ATARAX ) 25 MG TABLET    Take 1-2 tablets (25-50 mg total) by mouth 3 (three) times daily as needed.   KETOCONAZOLE  (NIZORAL ) 2 % SHAMPOO    USE AS DIRECTED   LEVOCETIRIZINE (XYZAL) 5 MG TABLET       MULTIPLE VITAMINS-MINERALS (MULTIVITAMINS THER. W/MINERALS) TABS    Take 1 tablet by mouth daily.   NAPROXEN  (NAPROSYN ) 500 MG TABLET    naproxen  500 mg tablet  Take 1 tablet every 12 hours by oral route as needed.   OMEPRAZOLE (PRILOSEC) 40 MG CAPSULE    Take 1 tablet by mouth daily.   ONDANSETRON   (ZOFRAN -ODT) 4 MG DISINTEGRATING TABLET    Take 1 tablet (4 mg total) by mouth every 8 (eight) hours as needed for nausea or vomiting.   PROMETHAZINE-DEXTROMETHORPHAN (PROMETHAZINE-DM) 6.25-15 MG/5ML SYRUP    Take 5 mLs by mouth at bedtime as needed for cough.   SAFETY SEAL MISCELLANEOUS MISC    Meaxemic Cream with tranexamic acid  5% kojic acid USP 2% vit C USP 2.5% hyaluronic acid EXCP 0.1% Use on affected areas twice a day.   SCOPOLAMINE  (TRANSDERM-SCOP) 1 MG/3DAYS    Place 1 patch (1.5 mg total) onto the skin every 3 (three) days.   TRAZODONE  (DESYREL ) 50 MG TABLET    TAKE 1/2 TABLET (25 MG TOTAL) BY MOUTH AT BEDTIME AS NEEDED FOR SLEEP.   TRIAMCINOLONE  CREAM (KENALOG ) 0.1 %    Apply 1 Application topically 2 (two) times daily.   VALACYCLOVIR  (VALTREX ) 500 MG TABLET    valacyclovir  500 mg tablet  TAKE 1 TABLET BY MOUTH EVERY DAY  Modified Medications   No medications on file  Discontinued Medications   No medications on file    Allergies: Allergies  Allergen Reactions   Sulfonamide Derivatives Rash    Past Medical History: Past Medical History:  Diagnosis Date   Anxiety    Depression    HIV positive (HCC)  Social History: Social History   Socioeconomic History   Marital status: Married    Spouse name: Not on file   Number of children: Not on file   Years of education: Not on file   Highest education level: Not on file  Occupational History   Not on file  Tobacco Use   Smoking status: Every Day    Current packs/day: 0.00    Average packs/day: 0.1 packs/day for 16.0 years (1.6 ttl pk-yrs)    Types: Cigarettes    Start date: 01/28/1995    Last attempt to quit: 01/28/2011    Years since quitting: 12.8   Smokeless tobacco: Never   Tobacco comments:    Reports she is down to 2 cigarettes a day   Vaping Use   Vaping status: Never Used  Substance and Sexual Activity   Alcohol use: Yes    Alcohol/week: 7.0 standard drinks of alcohol    Types: 7 Glasses of wine per  week    Comment: glass of wine a day per pt   Drug use: Not Currently    Types: Marijuana    Comment: occ--11/05/20- states has been over a week   Sexual activity: Not Currently    Partners: Male    Birth control/protection: Surgical    Comment: declined condoms  Other Topics Concern   Not on file  Social History Narrative   Not on file   Social Drivers of Health   Financial Resource Strain: Not on file  Food Insecurity: Not on file  Transportation Needs: Not on file  Physical Activity: Not on file  Stress: Not on file  Social Connections: Unknown (06/24/2021)   [facility]   Social Network    Social Network: Not on file    Labs: Lab Results  Component Value Date   HIV1RNAQUANT NOT DETECTED 10/16/2023   HIV1RNAQUANT NOT DETECTED 06/19/2023   HIV1RNAQUANT <20 (H) 12/15/2022   CD4TABS 580 12/15/2022   CD4TABS 355 (L) 08/22/2021   CD4TABS 365 (L) 12/17/2020    RPR and STI Lab Results  Component Value Date   LABRPR NON-REACTIVE 06/19/2023   LABRPR NON-REACTIVE 06/19/2020   LABRPR NON-REACTIVE 01/16/2020   LABRPR NON-REACTIVE 05/27/2019   LABRPR NON-REACTIVE 07/29/2018    STI Results GC CT  06/19/2020  4:47 PM Negative  Negative   01/16/2020  4:15 PM Negative  Negative   07/29/2018 12:00 AM Negative  Negative   01/13/2018 12:00 AM Negative  Negative   07/11/2015 12:00 AM Negative  Negative   01/08/2015 12:00 AM Negative  Negative   12/29/2014 12:00 AM Negative  Negative     Hepatitis B Lab Results  Component Value Date   HEPBSAB REACTIVE (A) 06/20/2021   HEPBSAG NO 04/06/2006   Hepatitis C No results found for: HEPCAB, HCVRNAPCRQN Hepatitis A Lab Results  Component Value Date   HAV REACTIVE (A) 06/18/2022   Lipids: Lab Results  Component Value Date   CHOL 213 (H) 06/19/2023   TRIG 80 06/19/2023   HDL 74 06/19/2023   CHOLHDL 2.9 06/19/2023   VLDL 12 07/16/2016   LDLCALC 122 (H) 06/19/2023    TARGET DATE:  The 11th of the  month  Assessment: Jacqueline Dawson presents today for their maintenance Cabenuva  injections. Initial/past injections were tolerated well without issues. No problems with systemic effects of injections.   Administered cabotegravir  600mg /49mL in left upper outer quadrant of the gluteal muscle. Administered rilpivirine  900 mg/3mL in the right upper outer quadrant of the gluteal muscle.  Monitored patient for 10 minutes after injection. Injections were tolerated well without issue. Patient will follow up in 2 months for next injections.  Eligible for flu, COVID, and Shingles vaccines; ***.   Plan: - Administer Cabenuva  injections  - Next injections scheduled for *** with Cathlyn and *** with me  - Call with any issues or questions  Alan Geralds, PharmD, CPP, BCIDP, AAHIVP Clinical Pharmacist Practitioner Infectious Diseases Clinical Pharmacist [facility]

## 2023-12-15 ENCOUNTER — Telehealth: Payer: Self-pay

## 2023-12-15 NOTE — Telephone Encounter (Signed)
 [facility] Patient Advocate Encounter  Patient's medications Cabenuva  have been couriered to [facility] and will be administered at the patients appointment on 12/16/23.  Arland Hutchinson, CPhT Specialty Pharmacy Patient [facility] for Infectious Disease Phone: [phone] Fax:  [phone]

## 2023-12-16 ENCOUNTER — Encounter: Admitting: Pharmacist

## 2023-12-16 DIAGNOSIS — B20 Human immunodeficiency virus [HIV] disease: Secondary | ICD-10-CM

## 2023-12-20 NOTE — Progress Notes (Unsigned)
 HPI: Jacqueline Dawson is a 48 y.o. female who presents to the [facility] pharmacy clinic for Cabenuva  administration.  Referring ID Physician: Jacqueline July, NP   Patient Active Problem List   Diagnosis Date Noted   History of motion sickness 08/18/2022   Eczema 11/15/2021   Fatigue due to depression 11/15/2021   Low libido 02/22/2021   Healthcare maintenance 12/17/2020   Fibroids 08/16/2020   HTN (hypertension) 01/27/2018   Adjustment disorder with anxious mood 01/15/2016   Insomnia 10/09/2010   Tobacco use 10/09/2010   ECTOPIC PREGNANCY 04/02/2007   Human immunodeficiency virus (HIV) disease (HCC) 11/11/2005   Depression 11/11/2005   ABFND PAP SMEAR HGSIL 11/11/2005    Patient's Medications  New Prescriptions   No medications on file  Previous Medications   CABOTEGRAVIR  & RILPIVIRINE  ER (CABENUVA ) 600 & 900 MG/3ML INJECTION    Inject 1 kit into the muscle every 2 (two) months.   CETIRIZINE-PSEUDOEPHEDRINE (ZYRTEC-D) 5-120 MG TABLET       CLOBETASOL  (TEMOVATE ) 0.05 % EXTERNAL SOLUTION    APPLY TO AFFECTED AREA TWICE A DAY   CLOBETASOL  OINTMENT (TEMOVATE ) 0.05 %    Apply 1 Application topically 2 (two) times daily. Use for 2 weeks. Then stop.   CYCLOBENZAPRINE (FLEXERIL) 5 MG TABLET    Take 5 mg by mouth at bedtime as needed.   FLUTICASONE (FLONASE) 50 MCG/ACT NASAL SPRAY       GUAIFENESIN 1200 MG TB12    Take 1 tablet (1,200 mg total) by mouth in the morning and at bedtime.   HYDROXYZINE  (ATARAX ) 25 MG TABLET    Take 1-2 tablets (25-50 mg total) by mouth 3 (three) times daily as needed.   KETOCONAZOLE  (NIZORAL ) 2 % SHAMPOO    USE AS DIRECTED   LEVOCETIRIZINE (XYZAL) 5 MG TABLET       MULTIPLE VITAMINS-MINERALS (MULTIVITAMINS THER. W/MINERALS) TABS    Take 1 tablet by mouth daily.   NAPROXEN  (NAPROSYN ) 500 MG TABLET    naproxen  500 mg tablet  Take 1 tablet every 12 hours by oral route as needed.   OMEPRAZOLE (PRILOSEC) 40 MG CAPSULE    Take 1 tablet by mouth daily.   ONDANSETRON   (ZOFRAN -ODT) 4 MG DISINTEGRATING TABLET    Take 1 tablet (4 mg total) by mouth every 8 (eight) hours as needed for nausea or vomiting.   PROMETHAZINE-DEXTROMETHORPHAN (PROMETHAZINE-DM) 6.25-15 MG/5ML SYRUP    Take 5 mLs by mouth at bedtime as needed for cough.   SAFETY SEAL MISCELLANEOUS MISC    Meaxemic Cream with tranexamic acid  5% kojic acid USP 2% vit C USP 2.5% hyaluronic acid EXCP 0.1% Use on affected areas twice a day.   SCOPOLAMINE  (TRANSDERM-SCOP) 1 MG/3DAYS    Place 1 patch (1.5 mg total) onto the skin every 3 (three) days.   TRAZODONE  (DESYREL ) 50 MG TABLET    TAKE 1/2 TABLET (25 MG TOTAL) BY MOUTH AT BEDTIME AS NEEDED FOR SLEEP.   TRIAMCINOLONE  CREAM (KENALOG ) 0.1 %    Apply 1 Application topically 2 (two) times daily.   VALACYCLOVIR  (VALTREX ) 500 MG TABLET    valacyclovir  500 mg tablet  TAKE 1 TABLET BY MOUTH EVERY DAY  Modified Medications   No medications on file  Discontinued Medications   No medications on file    Allergies: Allergies  Allergen Reactions   Sulfonamide Derivatives Rash    Past Medical History: Past Medical History:  Diagnosis Date   Anxiety    Depression    HIV positive (HCC)  Social History: Social History   Socioeconomic History   Marital status: Married    Spouse name: Not on file   Number of children: Not on file   Years of education: Not on file   Highest education level: Not on file  Occupational History   Not on file  Tobacco Use   Smoking status: Every Day    Current packs/day: 0.00    Average packs/day: 0.1 packs/day for 16.0 years (1.6 ttl pk-yrs)    Types: Cigarettes    Start date: 01/28/1995    Last attempt to quit: 01/28/2011    Years since quitting: 12.9   Smokeless tobacco: Never   Tobacco comments:    Reports she is down to 2 cigarettes a day   Vaping Use   Vaping status: Never Used  Substance and Sexual Activity   Alcohol use: Yes    Alcohol/week: 7.0 standard drinks of alcohol    Types: 7 Glasses of wine per  week    Comment: glass of wine a day per pt   Drug use: Not Currently    Types: Marijuana    Comment: occ--11/05/20- states has been over a week   Sexual activity: Not Currently    Partners: Male    Birth control/protection: Surgical    Comment: declined condoms  Other Topics Concern   Not on file  Social History Narrative   Not on file   Social Drivers of Health   Financial Resource Strain: Not on file  Food Insecurity: Not on file  Transportation Needs: Not on file  Physical Activity: Not on file  Stress: Not on file  Social Connections: Unknown (06/24/2021)   Received from [facility]   Social Network    Social Network: Not on file    Labs: Lab Results  Component Value Date   HIV1RNAQUANT NOT DETECTED 10/16/2023   HIV1RNAQUANT NOT DETECTED 06/19/2023   HIV1RNAQUANT <20 (H) 12/15/2022   CD4TABS 580 12/15/2022   CD4TABS 355 (L) 08/22/2021   CD4TABS 365 (L) 12/17/2020    RPR and STI Lab Results  Component Value Date   LABRPR NON-REACTIVE 06/19/2023   LABRPR NON-REACTIVE 06/19/2020   LABRPR NON-REACTIVE 01/16/2020   LABRPR NON-REACTIVE 05/27/2019   LABRPR NON-REACTIVE 07/29/2018    STI Results GC CT  06/19/2020  4:47 PM Negative  Negative   01/16/2020  4:15 PM Negative  Negative   07/29/2018 12:00 AM Negative  Negative   01/13/2018 12:00 AM Negative  Negative   07/11/2015 12:00 AM Negative  Negative   01/08/2015 12:00 AM Negative  Negative   12/29/2014 12:00 AM Negative  Negative     Hepatitis B Lab Results  Component Value Date   HEPBSAB REACTIVE (A) 06/20/2021   HEPBSAG NO 04/06/2006   Hepatitis C No results found for: HEPCAB, HCVRNAPCRQN Hepatitis A Lab Results  Component Value Date   HAV REACTIVE (A) 06/18/2022   Lipids: Lab Results  Component Value Date   CHOL 213 (H) 06/19/2023   TRIG 80 06/19/2023   HDL 74 06/19/2023   CHOLHDL 2.9 06/19/2023   VLDL 12 07/16/2016   LDLCALC 122 (H) 06/19/2023    TARGET DATE:  The 11th of the  month  Assessment: Jacqueline Dawson presents today for their maintenance Cabenuva  injections. Last HIV RNA was not detected in September. No issues today.  She states today that her mood has not improved. 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 She previously discussed starting something for her mood with Jacqueline and is interested in doing so today to try and get ahead of her triggers. She was interested in both Wellburin and Lexapro . After discussion of both, she would like to try Lexapro  at a low dose. Reminded her that it takes several weeks to stat feeling a benefit from Lexapro . She would like to see Jacqueline in ~6-8 weeks for a check in. Jacqueline was not available during her next Cabenua window so made an additional appointment for her to come and see him. Advised her to reach out if she needed anything. She also requested a refill of her trazodone .   Administered cabotegravir  600mg /27mL in left upper outer quadrant of the gluteal muscle. Administered rilpivirine  900 mg/3mL in the right upper outer quadrant of the gluteal muscle. Monitored patient for 10 minutes after injection. Injections were tolerated well without issue. She will follow up in 2 months for next injections.  Plan: - Administer Cabenuva  injections  - Start Lexapro  5 mg PO daily - Refills for trazodone  sent - Follow up with Jacqueline on 02/08/24 - Next injections scheduled for 02/16/24 with  me - Call with any issues or questions  Geoffery Aultman L. Arienne Gartin, PharmD, BCIDP, AAHIVP, CPP Infectious Diseases Clinical Pharmacist Practitioner Clinical Pharmacist Lead, Specialty Pharmacy [facility] for Infectious Disease

## 2023-12-21 ENCOUNTER — Ambulatory Visit: Admitting: Pharmacist

## 2023-12-21 ENCOUNTER — Other Ambulatory Visit: Payer: Self-pay

## 2023-12-21 DIAGNOSIS — G4700 Insomnia, unspecified: Secondary | ICD-10-CM

## 2023-12-21 DIAGNOSIS — B20 Human immunodeficiency virus [HIV] disease: Secondary | ICD-10-CM

## 2023-12-21 DIAGNOSIS — F324 Major depressive disorder, single episode, in partial remission: Secondary | ICD-10-CM

## 2023-12-21 MED ORDER — ESCITALOPRAM OXALATE 5 MG PO TABS: 5.0000 mg | ORAL_TABLET | Freq: Every day | ORAL | 2 refills | Status: DC

## 2023-12-21 MED ORDER — CABOTEGRAVIR & RILPIVIRINE ER 600 & 900 MG/3ML IM SUER
1.0000 | Freq: Once | INTRAMUSCULAR | Status: AC
  Administered 2023-12-21: 1 via INTRAMUSCULAR

## 2023-12-21 MED ORDER — TRAZODONE HCL 50 MG PO TABS: ORAL_TABLET | ORAL | 3 refills | Status: DC

## 2024-02-08 ENCOUNTER — Other Ambulatory Visit: Payer: Self-pay

## 2024-02-08 ENCOUNTER — Ambulatory Visit (INDEPENDENT_AMBULATORY_CARE_PROVIDER_SITE_OTHER): Admitting: Family

## 2024-02-08 ENCOUNTER — Encounter: Payer: Self-pay | Admitting: Family

## 2024-02-08 VITALS — BP 144/99 | HR 78 | Temp 98.5°F | Wt 142.0 lb

## 2024-02-08 DIAGNOSIS — G4700 Insomnia, unspecified: Secondary | ICD-10-CM | POA: Diagnosis not present

## 2024-02-08 DIAGNOSIS — F324 Major depressive disorder, single episode, in partial remission: Secondary | ICD-10-CM | POA: Diagnosis not present

## 2024-02-08 DIAGNOSIS — R6882 Decreased libido: Secondary | ICD-10-CM

## 2024-02-08 DIAGNOSIS — R5383 Other fatigue: Secondary | ICD-10-CM

## 2024-02-08 DIAGNOSIS — F32A Depression, unspecified: Secondary | ICD-10-CM | POA: Diagnosis not present

## 2024-02-08 MED ORDER — TRAZODONE HCL 50 MG PO TABS: ORAL_TABLET | ORAL | 3 refills | Status: AC

## 2024-02-08 MED ORDER — ESCITALOPRAM OXALATE 10 MG PO TABS: 10.0000 mg | ORAL_TABLET | Freq: Every day | ORAL | 2 refills | Status: AC

## 2024-02-08 NOTE — Assessment & Plan Note (Signed)
 Trazodone  allowing for improved sleep and helping with decreased fatigue. Continue trazodone  25-50 mg nightly as needed for sleep.

## 2024-02-08 NOTE — Assessment & Plan Note (Signed)
 Previously present and unlikely related to escitalopram  although cannot exclude that it is now a possible contributing factor. Encouraged to follow up with Gynecology for further evaluation.

## 2024-02-08 NOTE — Assessment & Plan Note (Signed)
 Ms. Roseland has improvement in her symptoms with starting of escitalopram  and trazodone  and is sleeping well and continues to have racing thoughts at times with improved mood. Following discussion will continue current dose of trazodone  for sleep and increase escitalopram  to 10 mg daily. Continue with counseling to work through stressors. No suicidal ideations or signs of psychosis. Will plan for follow up in 6 weeks or sooner if needed.

## 2024-02-08 NOTE — Progress Notes (Signed)
 "   Brief Narrative   Patient ID: Jacqueline Dawson, female    DOB: [birth year], 48 y.o.   MRN: [id number]  Jacqueline Dawson is a 48 y/o AA female diagnosed with HIV disease in March 2004 with risk factor of heterosexual contact. Initial viral load was 39,100 with CD4 count 10. No Genosure was available. No history of opportunistic infection. Entered care at [facility]. Previous ART experience with Complara, Stribild, Triumeq, and now Cabenuva .   Subjective:   Chief Complaint  Patient presents with   Follow-up    Lexapro  follow up- concerns feels like she feels numbed.     HPI:  Jacqueline Dawson is a 48 y.o. female with well-controlled HIV disease on Cabenuva  last seen on 10/16/23 with concern for symptoms of anxiety, stress, and fatigue. Following discussion she was started on 5 mg of escitalopram  daily and trazodone  to aid in sleep. Here today for follow up.  Jacqueline Dawson has been doing better since her last office visit and has been taking the escitalopram  and trazodone  with no significant adverse side effects and has noticed some improvement in her symptoms of fatigue and stress but continue to feel numb at times and is concerned about low libido. Has nausea that is increased when she becomes stressed. She has been off work recently and able to get some relaxation.  Increased stress recently with decision to pursue divorce despite marriage counseling. Feels as though her mind races at night with concern of stressors. Sleeping well with trazodone . Continues to see a marriage and independent counselor to help with stressors. No suicidal ideations.   Denies fevers, chills, night sweats, headaches, changes in vision, neck pain/stiffness, nausea, diarrhea, vomiting, lesions or rashes.  Lab Results  Component Value Date   CD4TCELL 28 (L) 10/16/2023   CD4TABS 580 12/15/2022   Lab Results  Component Value Date   HIV1RNAQUANT NOT DETECTED 10/16/2023     Allergies[1]    Outpatient Medications  Prior to Visit  Medication Sig Dispense Refill   cabotegravir  & rilpivirine  ER (CABENUVA ) 600 & 900 MG/3ML injection Inject 1 kit into the muscle every 2 (two) months. 6 mL 5   cetirizine-pseudoephedrine (ZYRTEC-D) 5-120 MG tablet      clobetasol  (TEMOVATE ) 0.05 % external solution APPLY TO AFFECTED AREA TWICE A DAY 50 mL 2   clobetasol  ointment (TEMOVATE ) 0.05 % Apply 1 Application topically 2 (two) times daily. Use for 2 weeks. Then stop. 60 g 1   cyclobenzaprine (FLEXERIL) 5 MG tablet Take 5 mg by mouth at bedtime as needed.     fluticasone (FLONASE) 50 MCG/ACT nasal spray      Guaifenesin  1200 MG TB12 Take 1 tablet (1,200 mg total) by mouth in the morning and at bedtime. 14 tablet 0   hydrOXYzine  (ATARAX ) 25 MG tablet Take 1-2 tablets (25-50 mg total) by mouth 3 (three) times daily as needed. 90 tablet 2   ketoconazole  (NIZORAL ) 2 % shampoo USE AS DIRECTED 120 mL 1   levocetirizine (XYZAL) 5 MG tablet      Multiple Vitamins-Minerals (MULTIVITAMINS THER. W/MINERALS) TABS Take 1 tablet by mouth daily.     naproxen  (NAPROSYN ) 500 MG tablet naproxen  500 mg tablet  Take 1 tablet every 12 hours by oral route as needed.     omeprazole (PRILOSEC) 40 MG capsule Take 1 tablet by mouth daily.     ondansetron  (ZOFRAN -ODT) 4 MG disintegrating tablet Take 1 tablet (4 mg total) by mouth every 8 (eight) hours as needed  for nausea or vomiting. 20 tablet 0   promethazine -dextromethorphan (PROMETHAZINE -DM) 6.25-15 MG/5ML syrup Take 5 mLs by mouth at bedtime as needed for cough. 118 mL 0   Safety Seal Miscellaneous MISC Meaxemic Cream with tranexamic acid  5% kojic acid USP 2% vit C USP 2.5% hyaluronic acid EXCP 0.1% Use on affected areas twice a day. 15 g 3   scopolamine  (TRANSDERM-SCOP) 1 MG/3DAYS Place 1 patch (1.5 mg total) onto the skin every 3 (three) days. 4 patch 1   triamcinolone  cream (KENALOG ) 0.1 % Apply 1 Application topically 2 (two) times daily. 80 g 0   valACYclovir (VALTREX) 500 MG tablet  valacyclovir 500 mg tablet  TAKE 1 TABLET BY MOUTH EVERY DAY     escitalopram  (LEXAPRO ) 5 MG tablet Take 1 tablet (5 mg total) by mouth daily. 30 tablet 2   traZODone  (DESYREL ) 50 MG tablet TAKE 1/2 TABLET (25 MG TOTAL) BY MOUTH AT BEDTIME AS NEEDED FOR SLEEP. 45 tablet 3   No facility-administered medications prior to visit.     Past Medical History:  Diagnosis Date   Anxiety    Depression    HIV positive (HCC)      Past Surgical History:  Procedure Laterality Date   COLONOSCOPY  11/05/2020   LAPAROSCOPIC OVARIAN CYSTECTOMY Left 12/13/2015   Procedure: LAPAROSCOPIC OVARIAN CYSTECTOMY;  Surgeon: Jon Rummer, MD;  Location: [facility];  Service: Gynecology;  Laterality: Left;   LAPAROSCOPIC OVARIAN CYSTECTOMY Right 01/17/2022   Procedure: LAPAROSCOPIC RIGHT OOPHERECTOMY;  Surgeon: Rummer Jon, MD;  Location: [facility];  Service: Gynecology;  Laterality: Right;   TUBAL LIGATION     WISDOM TOOTH EXTRACTION          Review of Systems  Constitutional:  Negative for chills, diaphoresis, fatigue and fever.  Respiratory:  Negative for cough, chest tightness, shortness of breath and wheezing.   Cardiovascular:  Negative for chest pain.  Gastrointestinal:  Negative for abdominal pain, diarrhea, nausea and vomiting.     Objective:   BP (!) 144/99   Pulse 78   Temp 98.5 F (36.9 C) (Oral)   Wt 142 lb (64.4 kg)   SpO2 99%   BMI 22.92 kg/m  Nursing note and vital signs reviewed.  Physical Exam Constitutional:      General: She is not in acute distress.    Appearance: She is well-developed.  Cardiovascular:     Rate and Rhythm: Normal rate and regular rhythm.     Heart sounds: Normal heart sounds.  Pulmonary:     Effort: Pulmonary effort is normal.     Breath sounds: Normal breath sounds.  Skin:    General: Skin is warm and dry.  Neurological:     Mental Status: She is alert and oriented to person, place, and time.  Psychiatric:        Attention  and Perception: Attention normal.        Mood and Affect: Mood and affect normal.        Speech: Speech normal.        Behavior: Behavior normal.        Thought Content: Thought content normal.        Cognition and Memory: Cognition normal.          08/21/2023   11:25 AM 12/15/2022    8:59 AM 02/13/2022    9:59 AM 11/15/2021   10:24 AM 02/22/2021    9:23 AM  Depression screen PHQ 2/9  Decreased Interest 0 0 0 0  0  Down, Depressed, Hopeless 0 0 0 0 1  PHQ - 2 Score 0 0 0 0 1        02/08/2024    9:08 AM 08/21/2023   11:25 AM 11/17/2016    9:07 AM  GAD 7 : Generalized Anxiety Score  Nervous, Anxious, on Edge 2 0 3  Control/stop worrying 3 0 3  Worry too much - different things 3 0 3  Trouble relaxing 3 0 3  Restless 3 0 3  Easily annoyed or irritable 3 0 3  Afraid - awful might happen 3 0 1  Total GAD 7 Score 20 0 19  Anxiety Difficulty  Not difficult at all Extremely difficult     The 10-year ASCVD risk score (Arnett DK, et al., 2019) is: 3.3%   Values used to calculate the score:     Age: 8 years     Clinically relevant sex: Female     Is Non-Hispanic African American: Yes     Diabetic: No     Tobacco smoker: Yes     Systolic Blood Pressure: 144 mmHg     Is BP treated: No     HDL Cholesterol: 74 mg/dL     Total Cholesterol: 213 mg/dL      Assessment & Plan:    Patient Active Problem List   Diagnosis Date Noted   History of motion sickness 08/18/2022   Eczema 11/15/2021   Fatigue due to depression 11/15/2021   Low libido 02/22/2021   Healthcare maintenance 12/17/2020   Fibroids 08/16/2020   HTN (hypertension) 01/27/2018   Adjustment disorder with anxious mood 01/15/2016   Insomnia 10/09/2010   Tobacco use 10/09/2010   ECTOPIC PREGNANCY 04/02/2007   Human immunodeficiency virus (HIV) disease (HCC) 11/11/2005   Depression 11/11/2005   ABFND PAP SMEAR HGSIL 11/11/2005     Problem List Items Addressed This Visit       Other   Depression   Relevant  Medications   traZODone  (DESYREL ) 50 MG tablet   escitalopram  (LEXAPRO ) 10 MG tablet   Insomnia   Trazodone  allowing for improved sleep and helping with decreased fatigue. Continue trazodone  25-50 mg nightly as needed for sleep.       Relevant Medications   traZODone  (DESYREL ) 50 MG tablet   Low libido   Previously present and unlikely related to escitalopram  although cannot exclude that it is now a possible contributing factor. Encouraged to follow up with Gynecology for further evaluation.       Fatigue due to depression - Primary   Jacqueline Dawson has improvement in her symptoms with starting of escitalopram  and trazodone  and is sleeping well and continues to have racing thoughts at times with improved mood. Following discussion will continue current dose of trazodone  for sleep and increase escitalopram  to 10 mg daily. Continue with counseling to work through stressors. No suicidal ideations or signs of psychosis. Will plan for follow up in 6 weeks or sooner if needed.         I have changed Jacqueline Dawson's traZODone  and escitalopram . I am also having her maintain her multivitamins ther. w/minerals, valACYclovir, naproxen , triamcinolone  cream, cyclobenzaprine, fluticasone, omeprazole, scopolamine , ketoconazole , hydrOXYzine , clobetasol  ointment, cabotegravir  & rilpivirine  ER, Safety Seal Miscellaneous, clobetasol , cetirizine-pseudoephedrine, levocetirizine, ondansetron , promethazine -dextromethorphan, and Guaifenesin .   Meds ordered this encounter  Medications   traZODone  (DESYREL ) 50 MG tablet    Sig: TAKE 1/2 to 1 TABLET (25-50 MG TOTAL) BY MOUTH AT BEDTIME AS NEEDED FOR SLEEP.  Dispense:  30 tablet    Refill:  3    Supervising Provider:   SNIDER, CYNTHIA [[id number]]   escitalopram  (LEXAPRO ) 10 MG tablet    Sig: Take 1 tablet (10 mg total) by mouth daily.    Dispense:  30 tablet    Refill:  2    Supervising Provider:   LUIZ CHANNEL [[id number]]     Follow-up: Return in about 6 weeks  (around 03/21/2024). or sooner if needed.    Cathlyn July, MSN, FNP-C Nurse Practitioner [facility] for Infectious Disease [facility] Main number: [phone]      [1]  Allergies Allergen Reactions   Sulfonamide Derivatives Rash   "

## 2024-02-08 NOTE — Patient Instructions (Addendum)
Nice to see you.  Continue to take your medication daily as prescribed.  Refills have been sent to the pharmacy.  Plan for follow up in 6 weeks or sooner if needed with lab work on the same day.  Have a great day and stay safe!

## 2024-02-15 NOTE — Progress Notes (Signed)
 "  HPI: Jacqueline Dawson is a 49 y.o. female who presents to the [facility] pharmacy clinic for Cabenuva  administration.  Referring ID Provider: Greg Calone. NP  Patient Active Problem List   Diagnosis Date Noted   History of motion sickness 08/18/2022   Eczema 11/15/2021   Fatigue due to depression 11/15/2021   Low libido 02/22/2021   Healthcare maintenance 12/17/2020   Fibroids 08/16/2020   HTN (hypertension) 01/27/2018   Adjustment disorder with anxious mood 01/15/2016   Insomnia 10/09/2010   Tobacco use 10/09/2010   ECTOPIC PREGNANCY 04/02/2007   Human immunodeficiency virus (HIV) disease (HCC) 11/11/2005   Depression 11/11/2005   ABFND PAP SMEAR HGSIL 11/11/2005    Patient's Medications  New Prescriptions   No medications on file  Previous Medications   CABOTEGRAVIR  & RILPIVIRINE  ER (CABENUVA ) 600 & 900 MG/3ML INJECTION    Inject 1 kit into the muscle every 2 (two) months.   CETIRIZINE-PSEUDOEPHEDRINE (ZYRTEC-D) 5-120 MG TABLET       CLOBETASOL  (TEMOVATE ) 0.05 % EXTERNAL SOLUTION    APPLY TO AFFECTED AREA TWICE A DAY   CLOBETASOL  OINTMENT (TEMOVATE ) 0.05 %    Apply 1 Application topically 2 (two) times daily. Use for 2 weeks. Then stop.   CYCLOBENZAPRINE (FLEXERIL) 5 MG TABLET    Take 5 mg by mouth at bedtime as needed.   ESCITALOPRAM  (LEXAPRO ) 10 MG TABLET    Take 1 tablet (10 mg total) by mouth daily.   FLUTICASONE (FLONASE) 50 MCG/ACT NASAL SPRAY       GUAIFENESIN  1200 MG TB12    Take 1 tablet (1,200 mg total) by mouth in the morning and at bedtime.   HYDROXYZINE  (ATARAX ) 25 MG TABLET    Take 1-2 tablets (25-50 mg total) by mouth 3 (three) times daily as needed.   KETOCONAZOLE  (NIZORAL ) 2 % SHAMPOO    USE AS DIRECTED   LEVOCETIRIZINE (XYZAL) 5 MG TABLET       MULTIPLE VITAMINS-MINERALS (MULTIVITAMINS THER. W/MINERALS) TABS    Take 1 tablet by mouth daily.   NAPROXEN  (NAPROSYN ) 500 MG TABLET    naproxen  500 mg tablet  Take 1 tablet every 12 hours by oral route as needed.    OMEPRAZOLE (PRILOSEC) 40 MG CAPSULE    Take 1 tablet by mouth daily.   ONDANSETRON  (ZOFRAN -ODT) 4 MG DISINTEGRATING TABLET    Take 1 tablet (4 mg total) by mouth every 8 (eight) hours as needed for nausea or vomiting.   PROMETHAZINE -DEXTROMETHORPHAN (PROMETHAZINE -DM) 6.25-15 MG/5ML SYRUP    Take 5 mLs by mouth at bedtime as needed for cough.   SAFETY SEAL MISCELLANEOUS MISC    Meaxemic Cream with tranexamic acid  5% kojic acid USP 2% vit C USP 2.5% hyaluronic acid EXCP 0.1% Use on affected areas twice a day.   SCOPOLAMINE  (TRANSDERM-SCOP) 1 MG/3DAYS    Place 1 patch (1.5 mg total) onto the skin every 3 (three) days.   TRAZODONE  (DESYREL ) 50 MG TABLET    TAKE 1/2 to 1 TABLET (25-50 MG TOTAL) BY MOUTH AT BEDTIME AS NEEDED FOR SLEEP.   TRIAMCINOLONE  CREAM (KENALOG ) 0.1 %    Apply 1 Application topically 2 (two) times daily.   VALACYCLOVIR (VALTREX) 500 MG TABLET    valacyclovir 500 mg tablet  TAKE 1 TABLET BY MOUTH EVERY DAY  Modified Medications   No medications on file  Discontinued Medications   No medications on file    Allergies: Allergies[1]  Labs: Lab Results  Component Value Date   HIV1RNAQUANT NOT  DETECTED 10/16/2023   HIV1RNAQUANT NOT DETECTED 06/19/2023   HIV1RNAQUANT <20 (H) 12/15/2022   CD4TABS 580 12/15/2022   CD4TABS 355 (L) 08/22/2021   CD4TABS 365 (L) 12/17/2020    RPR and STI Lab Results  Component Value Date   LABRPR NON-REACTIVE 06/19/2023   LABRPR NON-REACTIVE 06/19/2020   LABRPR NON-REACTIVE 01/16/2020   LABRPR NON-REACTIVE 05/27/2019   LABRPR NON-REACTIVE 07/29/2018    STI Results GC CT  06/19/2020  4:47 PM Negative  Negative   01/16/2020  4:15 PM Negative  Negative   07/29/2018 12:00 AM Negative  Negative   01/13/2018 12:00 AM Negative  Negative   07/11/2015 12:00 AM Negative  Negative   01/08/2015 12:00 AM Negative  Negative   12/29/2014 12:00 AM Negative  Negative     Hepatitis B Lab Results  Component Value Date   HEPBSAB REACTIVE (A)  06/20/2021   HEPBSAG NO 04/06/2006   Hepatitis C No results found for: HEPCAB, HCVRNAPCRQN Hepatitis A Lab Results  Component Value Date   HAV REACTIVE (A) 06/18/2022   Lipids: Lab Results  Component Value Date   CHOL 213 (H) 06/19/2023   TRIG 80 06/19/2023   HDL 74 06/19/2023   CHOLHDL 2.9 06/19/2023   VLDL 12 07/16/2016   LDLCALC 122 (H) 06/19/2023    Target Date: The 11th  Assessment: Jadore presents today for her maintenance Cabenuva  injections. Past injections were tolerated well without issues. Last HIV RNA was not detected in September. She recently saw Cathlyn who increased her Lexapro  to 10 mg daily. 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 The trazodone  is also helping her sleep. She is in great spirits today.  Lab work:  No HIV RNA needed today. She states that she has some dysuria and back pain and is requesting and urinalysis today. Will order.  Eligible vaccinations:  Declines COVID vaccine and is not interested in getting the Shingles vaccine just yet.  Cabenuva : Administered cabotegravir  600mg /98mL in left upper outer quadrant of the gluteal muscle. Administered rilpivirine  900 mg/3mL in the right upper outer quadrant of the gluteal muscle. No issues with injections. She will follow up in 2 months for next set of injections.  Plan: - Cabenuva  injections administered - UA today - Next injections scheduled for 03/28/24 with Cathlyn (not a Cabenuva  appt); 04/19/24 with me for Cabenuva  injections - Call with any issues or questions  Zemirah Krasinski L. Kyrese Gartman, PharmD, BCIDP, AAHIVP, CPP Clinical Pharmacist Practitioner - Infectious Diseases Clinical Pharmacist Lead - Specialty Pharmacy [facility] for [facility]     [1]  Allergies Allergen Reactions   Sulfonamide Derivatives Rash   "

## 2024-02-16 ENCOUNTER — Ambulatory Visit: Payer: Self-pay | Admitting: Pharmacist

## 2024-02-16 ENCOUNTER — Other Ambulatory Visit: Payer: Self-pay

## 2024-02-16 DIAGNOSIS — R300 Dysuria: Secondary | ICD-10-CM

## 2024-02-16 DIAGNOSIS — B20 Human immunodeficiency virus [HIV] disease: Secondary | ICD-10-CM

## 2024-02-16 MED ORDER — CABOTEGRAVIR & RILPIVIRINE ER 600 & 900 MG/3ML IM SUER
1.0000 | Freq: Once | INTRAMUSCULAR | Status: AC
  Administered 2024-02-16: 1 via INTRAMUSCULAR

## 2024-02-17 LAB — URINALYSIS, ROUTINE W REFLEX MICROSCOPIC
Bilirubin Urine: NEGATIVE
Glucose, UA: NEGATIVE
Hgb urine dipstick: NEGATIVE
Ketones, ur: NEGATIVE
Leukocytes,Ua: NEGATIVE
Nitrite: NEGATIVE
Protein, ur: NEGATIVE
Specific Gravity, Urine: 1.018 (ref 1.001–1.035)
pH: 7 (ref 5.0–8.0)

## 2024-03-28 ENCOUNTER — Ambulatory Visit: Payer: Self-pay | Admitting: Family

## 2024-04-19 ENCOUNTER — Ambulatory Visit: Payer: Self-pay | Admitting: Pharmacist
